# Patient Record
Sex: Male | Born: 1935 | Race: White | Hispanic: No | Smoking: Never smoker
Health system: Southern US, Community
[De-identification: ages and names within clinical notes are randomized; demographics above are authoritative.]

## PROBLEM LIST (undated history)

## (undated) DIAGNOSIS — H348322 Tributary (branch) retinal vein occlusion, left eye, stable: Secondary | ICD-10-CM

## (undated) DIAGNOSIS — I10 Essential (primary) hypertension: Secondary | ICD-10-CM

## (undated) DIAGNOSIS — G459 Transient cerebral ischemic attack, unspecified: Secondary | ICD-10-CM

## (undated) DIAGNOSIS — E7800 Pure hypercholesterolemia, unspecified: Secondary | ICD-10-CM

## (undated) HISTORY — DX: Pure hypercholesterolemia, unspecified: E78.00

## (undated) HISTORY — PX: CAROTID ENDARTERECTOMY: SUR193

## (undated) HISTORY — PX: INNER EAR SURGERY: SHX679

## (undated) HISTORY — DX: Tributary (branch) retinal vein occlusion, left eye, stable: H34.8322

## (undated) HISTORY — DX: Transient cerebral ischemic attack, unspecified: G45.9

## (undated) HISTORY — DX: Essential (primary) hypertension: I10

## (undated) HISTORY — PX: HERNIA REPAIR: SHX51

---

## 2002-10-27 ENCOUNTER — Encounter: Payer: Self-pay | Admitting: General Surgery

## 2002-10-27 ENCOUNTER — Encounter: Admission: RE | Admit: 2002-10-27 | Discharge: 2002-10-27 | Payer: Self-pay | Admitting: General Surgery

## 2002-10-28 ENCOUNTER — Ambulatory Visit (HOSPITAL_BASED_OUTPATIENT_CLINIC_OR_DEPARTMENT_OTHER): Admission: RE | Admit: 2002-10-28 | Discharge: 2002-10-29 | Payer: Self-pay | Admitting: General Surgery

## 2003-06-21 ENCOUNTER — Encounter: Payer: Self-pay | Admitting: Gastroenterology

## 2004-07-07 ENCOUNTER — Ambulatory Visit: Payer: Self-pay | Admitting: Internal Medicine

## 2004-08-04 ENCOUNTER — Ambulatory Visit: Payer: Self-pay | Admitting: Internal Medicine

## 2004-08-07 ENCOUNTER — Ambulatory Visit: Payer: Self-pay | Admitting: Internal Medicine

## 2004-08-08 ENCOUNTER — Ambulatory Visit: Payer: Self-pay | Admitting: Internal Medicine

## 2004-08-15 ENCOUNTER — Ambulatory Visit: Payer: Self-pay | Admitting: Internal Medicine

## 2004-08-29 ENCOUNTER — Ambulatory Visit: Payer: Self-pay | Admitting: Internal Medicine

## 2004-09-12 ENCOUNTER — Ambulatory Visit: Payer: Self-pay | Admitting: Internal Medicine

## 2004-09-19 ENCOUNTER — Ambulatory Visit: Payer: Self-pay | Admitting: Internal Medicine

## 2004-10-10 ENCOUNTER — Ambulatory Visit: Payer: Self-pay | Admitting: Internal Medicine

## 2005-01-01 ENCOUNTER — Ambulatory Visit: Payer: Self-pay | Admitting: Internal Medicine

## 2005-03-23 ENCOUNTER — Ambulatory Visit: Payer: Self-pay | Admitting: Internal Medicine

## 2005-04-03 ENCOUNTER — Ambulatory Visit: Payer: Self-pay | Admitting: Internal Medicine

## 2005-05-04 ENCOUNTER — Ambulatory Visit: Payer: Self-pay | Admitting: Internal Medicine

## 2005-05-25 ENCOUNTER — Ambulatory Visit: Payer: Self-pay | Admitting: Internal Medicine

## 2005-08-03 ENCOUNTER — Ambulatory Visit: Payer: Self-pay | Admitting: Internal Medicine

## 2005-11-28 ENCOUNTER — Ambulatory Visit: Payer: Self-pay | Admitting: Internal Medicine

## 2006-01-03 ENCOUNTER — Ambulatory Visit: Payer: Self-pay | Admitting: Internal Medicine

## 2006-01-31 ENCOUNTER — Ambulatory Visit: Payer: Self-pay | Admitting: Internal Medicine

## 2006-05-23 ENCOUNTER — Ambulatory Visit: Payer: Self-pay | Admitting: Internal Medicine

## 2006-07-24 ENCOUNTER — Ambulatory Visit: Payer: Self-pay | Admitting: Internal Medicine

## 2006-08-19 ENCOUNTER — Ambulatory Visit: Payer: Self-pay | Admitting: Internal Medicine

## 2006-11-19 DIAGNOSIS — I10 Essential (primary) hypertension: Secondary | ICD-10-CM | POA: Insufficient documentation

## 2006-11-19 DIAGNOSIS — H9190 Unspecified hearing loss, unspecified ear: Secondary | ICD-10-CM | POA: Insufficient documentation

## 2006-11-19 DIAGNOSIS — R519 Headache, unspecified: Secondary | ICD-10-CM | POA: Insufficient documentation

## 2006-11-19 DIAGNOSIS — R51 Headache: Secondary | ICD-10-CM | POA: Insufficient documentation

## 2006-11-19 DIAGNOSIS — H348392 Tributary (branch) retinal vein occlusion, unspecified eye, stable: Secondary | ICD-10-CM | POA: Insufficient documentation

## 2006-11-19 DIAGNOSIS — K279 Peptic ulcer, site unspecified, unspecified as acute or chronic, without hemorrhage or perforation: Secondary | ICD-10-CM | POA: Insufficient documentation

## 2006-12-03 ENCOUNTER — Ambulatory Visit: Payer: Self-pay | Admitting: Internal Medicine

## 2007-01-09 ENCOUNTER — Ambulatory Visit: Payer: Self-pay | Admitting: Internal Medicine

## 2007-01-09 DIAGNOSIS — M2550 Pain in unspecified joint: Secondary | ICD-10-CM | POA: Insufficient documentation

## 2007-01-11 ENCOUNTER — Encounter: Payer: Self-pay | Admitting: Internal Medicine

## 2007-01-11 LAB — CONVERTED CEMR LAB
BUN: 23 mg/dL (ref 6–23)
Basophils Absolute: 0.1 10*3/uL (ref 0.0–0.1)
Basophils Relative: 0.8 % (ref 0.0–1.0)
CO2: 26 meq/L (ref 19–32)
Calcium: 9.2 mg/dL (ref 8.4–10.5)
Chloride: 105 meq/L (ref 96–112)
Creatinine, Ser: 1.1 mg/dL (ref 0.4–1.5)
Eosinophils Absolute: 0.2 10*3/uL (ref 0.0–0.6)
Eosinophils Relative: 2.4 % (ref 0.0–5.0)
GFR calc Af Amer: 83 mL/min
GFR calc non Af Amer: 69 mL/min
Glucose, Bld: 81 mg/dL (ref 70–99)
HCT: 44.3 % (ref 39.0–52.0)
Hemoglobin: 14.7 g/dL (ref 13.0–17.0)
Lymphocytes Relative: 24.8 % (ref 12.0–46.0)
MCHC: 33.2 g/dL (ref 30.0–36.0)
MCV: 86.5 fL (ref 78.0–100.0)
Monocytes Absolute: 0.6 10*3/uL (ref 0.2–0.7)
Monocytes Relative: 8.2 % (ref 3.0–11.0)
Neutro Abs: 5 10*3/uL (ref 1.4–7.7)
Neutrophils Relative %: 63.8 % (ref 43.0–77.0)
Platelets: 237 10*3/uL (ref 150–400)
Potassium: 4.3 meq/L (ref 3.5–5.1)
RBC: 5.12 M/uL (ref 4.22–5.81)
RDW: 12.7 % (ref 11.5–14.6)
Sed Rate: 11 mm/hr (ref 0–20)
Sodium: 138 meq/L (ref 135–145)
TSH: 1.52 microintl units/mL (ref 0.35–5.50)
Total CK: 103 units/L (ref 7–195)
WBC: 7.9 10*3/uL (ref 4.5–10.5)

## 2007-01-13 ENCOUNTER — Telehealth (INDEPENDENT_AMBULATORY_CARE_PROVIDER_SITE_OTHER): Payer: Self-pay | Admitting: *Deleted

## 2007-01-23 ENCOUNTER — Ambulatory Visit: Payer: Self-pay | Admitting: Cardiology

## 2007-01-27 ENCOUNTER — Ambulatory Visit: Payer: Self-pay | Admitting: Internal Medicine

## 2007-01-27 DIAGNOSIS — I739 Peripheral vascular disease, unspecified: Secondary | ICD-10-CM

## 2007-01-27 DIAGNOSIS — I779 Disorder of arteries and arterioles, unspecified: Secondary | ICD-10-CM | POA: Insufficient documentation

## 2007-02-04 ENCOUNTER — Encounter: Admission: RE | Admit: 2007-02-04 | Discharge: 2007-02-04 | Payer: Self-pay | Admitting: Internal Medicine

## 2007-03-07 ENCOUNTER — Encounter: Payer: Self-pay | Admitting: Internal Medicine

## 2007-03-15 ENCOUNTER — Encounter: Admission: RE | Admit: 2007-03-15 | Discharge: 2007-03-15 | Payer: Self-pay | Admitting: Neurology

## 2007-06-19 ENCOUNTER — Ambulatory Visit: Payer: Self-pay | Admitting: Internal Medicine

## 2007-09-26 ENCOUNTER — Encounter: Payer: Self-pay | Admitting: Internal Medicine

## 2007-09-27 ENCOUNTER — Encounter: Payer: Self-pay | Admitting: Internal Medicine

## 2007-10-02 ENCOUNTER — Encounter: Payer: Self-pay | Admitting: Internal Medicine

## 2007-10-06 ENCOUNTER — Ambulatory Visit: Payer: Self-pay | Admitting: Internal Medicine

## 2007-10-06 DIAGNOSIS — G459 Transient cerebral ischemic attack, unspecified: Secondary | ICD-10-CM | POA: Insufficient documentation

## 2007-10-17 ENCOUNTER — Telehealth (INDEPENDENT_AMBULATORY_CARE_PROVIDER_SITE_OTHER): Payer: Self-pay | Admitting: *Deleted

## 2007-10-20 ENCOUNTER — Telehealth: Payer: Self-pay | Admitting: Internal Medicine

## 2007-10-28 ENCOUNTER — Telehealth (INDEPENDENT_AMBULATORY_CARE_PROVIDER_SITE_OTHER): Payer: Self-pay | Admitting: *Deleted

## 2009-12-14 ENCOUNTER — Encounter (INDEPENDENT_AMBULATORY_CARE_PROVIDER_SITE_OTHER): Payer: Self-pay | Admitting: *Deleted

## 2010-08-29 NOTE — Letter (Signed)
Summary: New Patient letter  [facility]  [address] [place], [place] [address]   Phone: [phone]  Fax: [phone]       12/14/2009 MRN: [id number]  [facility] [place], [place]  [address]  Dear Mr. [facility],  Welcome to the Gastroenterology Division at [facility].    You are scheduled to see Dr.   Deatra Ina on 01-23-10 at 1:30pm on the 3rd floor at [facility] [place] [employer] [facility].  We ask that you try to arrive at our office 15 minutes prior to your appointment time to allow for check-in.  We would like you to complete the enclosed self-administered evaluation form prior to your visit and bring it with you on the day of your appointment.  We will review it with you.  Also, please bring a complete list of all your medications or, if you prefer, bring the medication bottles and we will list them.  Please bring your insurance card so that we may make a copy of it.  If your insurance requires a referral to see a specialist, please bring your referral form from your primary care physician.  Co-payments are due at the time of your visit and may be paid by cash, check or credit card.     Your office visit will consist of a consult with your physician (includes a physical exam), any laboratory testing he/she may order, scheduling of any necessary diagnostic testing (e.g. x-ray, ultrasound, CT-scan), and scheduling of a procedure (e.g. Endoscopy, Colonoscopy) if required.  Please allow enough time on your schedule to allow for any/all of these possibilities.    If you cannot keep your appointment, please call [phone] to cancel or reschedule prior to your appointment date.  This allows [place] the opportunity to schedule an appointment for another patient in need of care.  If you do not cancel or reschedule by 5 p.m. the business day prior to your appointment date, you will be charged a $50.00 late cancellation/no-show fee.    Thank you for choosing [facility] for your medical needs.  We appreciate the opportunity to care for you.  Please visit [place] at our website  to learn more about our practice.                     Sincerely,                                                             The Gastroenterology Division

## 2010-08-29 NOTE — Procedures (Signed)
Summary: colonoscopy   Colonoscopy  Procedure date:  06/21/2003  Findings:      Results: Hemorrhoids.     Results: Diverticulosis.       Pathology:  Adenomatous polyp.        Location:  [facility].    Comments:      Repeat colonoscopy in 5 years.  Patient Name: [name], [name] MRN:  Procedure Procedures: Colonoscopy CPT: [id number].    with Hot Biopsy(s)CPT: [id number].  Personnel: Endoscopist: Sandy Salaam. Deatra Ina, MD.  Indications  Increased Risk Screening: For family history of colorectal neoplasia, in   History  Current Medications: Patient is not currently taking Coumadin.  Pre-Exam Physical: Performed Jun 21, 2003. Entire physical exam was normal.  Exam Exam: Extent of exam reached: Cecum, extent intended: Cecum.  The cecum was identified by IC valve. Colon retroflexion performed. ASA Classification: II. Tolerance: good.  Monitoring: Pulse and BP monitoring, Oximetry used. Supplemental O2 given. at 2 Liters.  Colon Prep Used Golytely for colon prep. Prep results: good.  Sedation Meds: Fentanyl 50 mcg. given IV. Versed 5 mg. given IV.  Findings - DIVERTICULOSIS: Descending Colon to Sigmoid Colon. ICD9: Diverticulosis: 562.10. Comments: Diffuse, scattered tics.  NORMAL EXAM: Cecum.  POLYP: Cecum, Maximum size: 2 mm. Procedure:  hot biopsy, ICD9: Colon Polyps: 211.3.  HEMORRHOIDS: Internal. ICD9: Hemorrhoids, Internal: 455.0.   Assessment Abnormal examination, see findings above.  Diagnoses: 211.3: Colon Polyps.  562.10: Diverticulosis.  455.0: Hemorrhoids, Internal.   Events  Unplanned Interventions: No intervention was required.  Unplanned Events: There were no complications. Plans Patient Education: Patient given standard instructions for: Polyps. Diverticulosis. Hemorrhoids.  Scheduling/Referral: Colonoscopy, to Sandy Salaam. Deatra Ina, MD, around Jun 20, 2008.    This report was created from the original endoscopy report, which was reviewed  and signed by the above listed endoscopist.

## 2010-12-15 NOTE — Op Note (Signed)
[name], [name]                         ACCOUNT NO.:  [id number]   MEDICAL RECORD NO.:  [id number]                   PATIENT TYPE:  AMB   LOCATION:  [facility]                                  FACILITY:   PHYSICIAN:  Shellia Carwin, M.D.              DATE OF BIRTH:  Sep 04, 1927   DATE OF PROCEDURE:  10/28/2002  DATE OF DISCHARGE:                                 OPERATIVE REPORT   OPERATION/PROCEDURE:  Right inguinal hernia repair with Kugel mesh system.   SURGEON:  Shellia Carwin, M.D.   ANESTHESIA:  General.   PREOPERATIVE DIAGNOSIS:  Right inguinal hernia.   POSTOPERATIVE DIAGNOSIS:  Right inguinal hernia, large lipoma of the cord,  indirect hernia, direct hernia.   CLINICAL SUMMARY:  A 77 year old male with bulge in his groin for over 20  years.  Has become symptomatic.   OPERATIVE FINDINGS:  The patient had a large lipoma of the right cord, a  large hernia with a large amount of fat around it in the indirect sac, a  direct hernia.  The cord structures were identified and they and the nerves  are not injured.   DESCRIPTION OF PROCEDURE:  Under satisfactory general anesthesia, having  received IV Ancef, the patient was positioned, prepped and draped in the  standard fashion.  A total of 30 cc of 0.5% Marcaine with epinephrine was  infiltrated throughout for postop analgesia.  A transverse incision made and  carried down to and through the external ring and external oblique.  Self-  retaining retractor gave excellent exposure and the cord and its contents  were mobilized.  The large lipoma was dissected high, ligated with Vicryl  suture and excised.  The large hernia sac was felt best handled by just  inversion after high dissection and was not opened.  After it was inverted  through the internal ring, the floor of the canal was opened from the pubis  to the internal ring and finger dissection used to sweep away the  preperitoneal contents and allow for placement of  the mesh.  The three-inch  circle of the Kugel mesh system was anchored to Cooper's ligament with a  single 0 Prolene suture.  The gauze that used to hold the contents away was  removed and then the mesh unfolded in the preperitoneal space.  It extended  from the pubis to beyond the internal ring and dissection such that it lay  deep to the inferior epigastric vessels.  A single 0 Prolene suture was used  at the medial aspect through the handle of the mesh to the undersurface of  the abdominal wall.  The floor of the canal was then closed over the mesh,  taking intermittent bites of it using a 0 Prolene suture and when tied at  the internal ring, the ring was snug, and the ends of the suture left long.  The remaining oval of the mesh  was tailored to fit in the floor of the canal  with a slit to go around the cord structures. It was anchored medially in  the soft tissue near the pubis, at the internal ring with the previous  sutures and then with a single suture in the inguinal ligament and a single  in the internal oblique medially.  The tails were brought around the cord  and sutured to each other.  The mesh lay flat, in good position and the  wound was lavaged with saline.  Closure accomplished in layers  with running 2-0 Vicryl for external oblique, interrupted 2-0 Vicryl for  deep fascia, 3-0 Vicryl for subcu, Steri-Strips for skin.  Sterile absorbent  dressings were applied and the patient went to the recovery room from the  operating room in good condition without complication.                                               Shellia Carwin, M.D.    MRL/MEDQ  D:  10/28/2002  T:  10/28/2002  Job:  [id number]

## 2016-01-03 LAB — HEPATIC FUNCTION PANEL
ALK PHOS: 114 (ref 25–125)
ALT: 9 — AB (ref 10–40)
AST: 18 (ref 14–40)
BILIRUBIN, TOTAL: 0.6

## 2016-01-03 LAB — BASIC METABOLIC PANEL
BUN: 19 (ref 4–21)
Creatinine: 1.3 (ref 0.6–1.3)
GLUCOSE: 113
Potassium: 4.6 (ref 3.4–5.3)
SODIUM: 137 (ref 137–147)

## 2016-01-03 LAB — CBC AND DIFFERENTIAL
HEMATOCRIT: 46 (ref 41–53)
HEMOGLOBIN: 14.7 (ref 13.5–17.5)
Platelets: 209 (ref 150–399)
WBC: 7.9

## 2016-06-27 LAB — LIPID PANEL
Cholesterol: 145 (ref 0–200)
HDL: 44 (ref 35–70)
LDL CALC: 79
Triglycerides: 110 (ref 40–160)

## 2016-06-27 LAB — BASIC METABOLIC PANEL
BUN: 19 (ref 4–21)
Creatinine: 1.2 (ref 0.6–1.3)
GLUCOSE: 102
Potassium: 4.7 (ref 3.4–5.3)
Sodium: 140 (ref 137–147)

## 2016-06-27 LAB — CBC AND DIFFERENTIAL
HCT: 45 (ref 41–53)
Hemoglobin: 14.5 (ref 13.5–17.5)
Platelets: 225 (ref 150–399)
WBC: 7.5

## 2016-06-27 LAB — TSH: TSH: 2.92 (ref 0.41–5.90)

## 2016-06-27 LAB — HEPATIC FUNCTION PANEL
ALK PHOS: 125 (ref 25–125)
ALT: 11 (ref 10–40)
AST: 11 — AB (ref 14–40)
BILIRUBIN, TOTAL: 0.5

## 2016-07-27 DIAGNOSIS — I10 Essential (primary) hypertension: Secondary | ICD-10-CM | POA: Diagnosis not present

## 2016-08-27 DIAGNOSIS — M159 Polyosteoarthritis, unspecified: Secondary | ICD-10-CM | POA: Diagnosis not present

## 2016-08-27 DIAGNOSIS — Z7901 Long term (current) use of anticoagulants: Secondary | ICD-10-CM | POA: Diagnosis not present

## 2016-08-27 DIAGNOSIS — M47812 Spondylosis without myelopathy or radiculopathy, cervical region: Secondary | ICD-10-CM | POA: Diagnosis not present

## 2016-08-27 DIAGNOSIS — G5763 Lesion of plantar nerve, bilateral lower limbs: Secondary | ICD-10-CM | POA: Diagnosis not present

## 2016-08-27 DIAGNOSIS — H6121 Impacted cerumen, right ear: Secondary | ICD-10-CM | POA: Diagnosis not present

## 2016-08-27 DIAGNOSIS — Z8673 Personal history of transient ischemic attack (TIA), and cerebral infarction without residual deficits: Secondary | ICD-10-CM | POA: Diagnosis not present

## 2016-08-27 DIAGNOSIS — M47819 Spondylosis without myelopathy or radiculopathy, site unspecified: Secondary | ICD-10-CM | POA: Diagnosis not present

## 2016-08-27 DIAGNOSIS — I10 Essential (primary) hypertension: Secondary | ICD-10-CM | POA: Diagnosis not present

## 2016-08-27 DIAGNOSIS — Z6829 Body mass index (BMI) 29.0-29.9, adult: Secondary | ICD-10-CM | POA: Diagnosis not present

## 2016-08-27 DIAGNOSIS — E785 Hyperlipidemia, unspecified: Secondary | ICD-10-CM | POA: Diagnosis not present

## 2016-08-27 DIAGNOSIS — Z961 Presence of intraocular lens: Secondary | ICD-10-CM | POA: Diagnosis not present

## 2016-08-27 DIAGNOSIS — E663 Overweight: Secondary | ICD-10-CM | POA: Diagnosis not present

## 2016-08-27 DIAGNOSIS — Z9849 Cataract extraction status, unspecified eye: Secondary | ICD-10-CM | POA: Diagnosis not present

## 2016-08-27 DIAGNOSIS — Z974 Presence of external hearing-aid: Secondary | ICD-10-CM | POA: Diagnosis not present

## 2016-08-27 DIAGNOSIS — Z0000 Encounter for general adult medical examination without abnormal findings: Secondary | ICD-10-CM | POA: Diagnosis not present

## 2016-08-27 DIAGNOSIS — H918X1 Other specified hearing loss, right ear: Secondary | ICD-10-CM | POA: Diagnosis not present

## 2016-10-16 DIAGNOSIS — I6523 Occlusion and stenosis of bilateral carotid arteries: Secondary | ICD-10-CM | POA: Diagnosis not present

## 2016-10-24 DIAGNOSIS — Z23 Encounter for immunization: Secondary | ICD-10-CM | POA: Diagnosis not present

## 2016-10-24 DIAGNOSIS — I739 Peripheral vascular disease, unspecified: Secondary | ICD-10-CM | POA: Diagnosis not present

## 2016-10-24 DIAGNOSIS — I10 Essential (primary) hypertension: Secondary | ICD-10-CM | POA: Diagnosis not present

## 2016-10-24 DIAGNOSIS — E7800 Pure hypercholesterolemia, unspecified: Secondary | ICD-10-CM | POA: Diagnosis not present

## 2016-10-24 LAB — HEPATIC FUNCTION PANEL
ALK PHOS: 116 (ref 25–125)
ALT: 8 — AB (ref 10–40)
AST: 17 (ref 14–40)
BILIRUBIN, TOTAL: 0.5

## 2016-10-24 LAB — BASIC METABOLIC PANEL
BUN: 33 — AB (ref 4–21)
CREATININE: 1.3 (ref 0.6–1.3)
Glucose: 100
Potassium: 5 (ref 3.4–5.3)
SODIUM: 139 (ref 137–147)

## 2016-10-24 LAB — CBC AND DIFFERENTIAL
HCT: 41 (ref 41–53)
Hemoglobin: 13.4 — AB (ref 13.5–17.5)
Platelets: 227 (ref 150–399)
WBC: 7.3

## 2016-12-26 DIAGNOSIS — T162XXA Foreign body in left ear, initial encounter: Secondary | ICD-10-CM | POA: Diagnosis not present

## 2017-05-13 DIAGNOSIS — Z23 Encounter for immunization: Secondary | ICD-10-CM | POA: Diagnosis not present

## 2017-06-05 DIAGNOSIS — I10 Essential (primary) hypertension: Secondary | ICD-10-CM | POA: Diagnosis not present

## 2017-06-05 DIAGNOSIS — R7309 Other abnormal glucose: Secondary | ICD-10-CM | POA: Diagnosis not present

## 2017-06-05 DIAGNOSIS — E7800 Pure hypercholesterolemia, unspecified: Secondary | ICD-10-CM | POA: Diagnosis not present

## 2017-12-05 ENCOUNTER — Encounter: Payer: Self-pay | Admitting: Family Medicine

## 2017-12-05 ENCOUNTER — Ambulatory Visit (INDEPENDENT_AMBULATORY_CARE_PROVIDER_SITE_OTHER): Payer: Medicare HMO | Admitting: Family Medicine

## 2017-12-05 VITALS — BP 110/60 | HR 59 | Ht 70.0 in | Wt 186.4 lb

## 2017-12-05 DIAGNOSIS — G629 Polyneuropathy, unspecified: Secondary | ICD-10-CM | POA: Diagnosis not present

## 2017-12-05 DIAGNOSIS — I10 Essential (primary) hypertension: Secondary | ICD-10-CM | POA: Diagnosis not present

## 2017-12-05 DIAGNOSIS — G459 Transient cerebral ischemic attack, unspecified: Secondary | ICD-10-CM

## 2017-12-05 DIAGNOSIS — I6522 Occlusion and stenosis of left carotid artery: Secondary | ICD-10-CM

## 2017-12-05 MED ORDER — SIMVASTATIN 5 MG PO TABS: 5.0000 mg | ORAL_TABLET | Freq: Every day | ORAL | 3 refills | Status: DC

## 2017-12-05 NOTE — Patient Instructions (Signed)
Carotid Artery Disease The carotid arteries are arteries on both sides of the neck. They carry blood to the brain. Carotid artery disease is when the arteries get smaller (narrow) or get blocked. If these arteries get smaller or get blocked, you are more likely to have a stroke or warning stroke (transient ischemic attack). Follow these instructions at home:  Take medicines as told by your doctor. Make sure you understand all your medicine instructions. Do not stop your medicines without talking to your doctor first.  Follow your doctor's diet instructions. It is important to eat a healthy diet that includes plenty of: ? Fresh fruits. ? Vegetables. ? Lean meats.  Avoid: ? High-fat foods. ? High-sodium foods. ? Foods that are fried, overly processed, or have poor nutritional value.  Stay a healthy weight.  Stay active. Get at least 30 minutes of activity every day.  Do not smoke.  Limit alcohol use to: ? No more than 2 drinks a day for men. ? No more than 1 drink a day for women who are not pregnant.  Do not use illegal drugs.  Keep all doctor visits as told. Get help right away if:  You have sudden weakness or loss of feeling (numbness) on one side of the body, such as the face, arm, or leg.  You have sudden confusion.  You have trouble speaking (aphasia) or understanding.  You have sudden trouble seeing out of one or both eyes.  You have sudden trouble walking.  You have dizziness or feel like you might pass out (faint).  You have a loss of balance or your movements are not steady (uncoordinated).  You have a sudden, severe headache with no known cause.  You have trouble swallowing (dysphagia). Call your local emergency services (911 in [place] Do notdrive yourself to the clinic or hospital. This information is not intended to replace advice given to you by your health care provider. Make sure you discuss any questions you have with your health care  provider. Document Released: 07/02/2012 Document Revised: 12/22/2015 Document Reviewed: 01/14/2013 Elsevier Interactive Patient Education  2018 Elsevier Inc.  

## 2017-12-05 NOTE — Progress Notes (Signed)
Subjective:  Patient ID: [name], male    DOB: 04/25/1928  Age: 84 y.o. MRN: [id number]  CC: Establish Care   HPI [name] presents for follow-up of his hypertension, carotid artery stenosis and neuropathy.  Patient is well-known to me.  He does not smoke drink or use illicit drugs.  He is status post carotid endarterectomy on the right.  Last carotid ultrasound on the left some years ago had shown a 60% lesion he tells me.  He has a history of TIA.  He continues to take low-dose simvastatin Plavix and his blood pressure has been well controlled.  He lost his wife at the end of March.  He has a long-standing history of  Outpatient Medications Prior to Visit  Medication Sig Dispense Refill  . acetaminophen (TYLENOL) 500 MG tablet Take 500 mg by mouth every 6 (six) hours as needed.    [name] amLODipine (NORVASC) 10 MG tablet Take 1 tablet by mouth daily.  1  . Cholecalciferol (VITAMIN D3) 2000 units TABS Take 1 tablet by mouth daily.    . clopidogrel (PLAVIX) 75 MG tablet Take 1 tablet by mouth daily.    [name] gabapentin (NEURONTIN) 300 MG capsule Take 1 capsule by mouth 2 (two) times daily.  1  . lisinopril (PRINIVIL,ZESTRIL) 10 MG tablet Take 1 tablet by mouth daily.    . vitamin B-12 (CYANOCOBALAMIN) 500 MCG tablet Take 500 mcg by mouth daily.    . vitamin C (ASCORBIC ACID) 500 MG tablet Take 500 mg by mouth daily.    . Cholecalciferol (VITAMIN D-1000 MAX ST) 1000 units tablet Take 1,000 Units by mouth daily.    . simvastatin (ZOCOR) 5 MG tablet Take 1 tablet by mouth daily.     No facility-administered medications prior to visit.     ROS Review of Systems  Constitutional: Negative for chills, fatigue, fever and unexpected weight change.  HENT: Positive for postnasal drip, rhinorrhea and sneezing.   Eyes: Negative for photophobia and visual disturbance.  Respiratory: Negative.  Negative for chest tightness and shortness of breath.   Cardiovascular: Negative for chest pain and  palpitations.  Gastrointestinal: Negative.   Endocrine: Negative for polyphagia and polyuria.  Genitourinary: Negative for dysuria, frequency and urgency.  Musculoskeletal: Positive for gait problem. Negative for joint swelling.  Skin: Negative.   Allergic/Immunologic: Negative for immunocompromised state.  Neurological: Positive for numbness. Negative for weakness and headaches.  Hematological: Does not bruise/bleed easily.  Psychiatric/Behavioral: Negative.     Objective:  BP 110/60   Pulse (!) 59   Ht 5\' 10"  (1.778 m)   Wt 186 lb 6 oz (84.5 kg)   SpO2 94%   BMI 26.74 kg/m   BP Readings from Last 3 Encounters:  12/05/17 110/60    Wt Readings from Last 3 Encounters:  12/05/17 186 lb 6 oz (84.5 kg)    Physical Exam  Constitutional: He is oriented to person, place, and time. He appears well-developed and well-nourished. No distress.  HENT:  Head: Normocephalic and atraumatic.  Right Ear: External ear normal.  Left Ear: External ear normal.  Mouth/Throat: Oropharynx is clear and moist. No oropharyngeal exudate.  Eyes: Pupils are equal, round, and reactive to light. Conjunctivae and EOM are normal. Right eye exhibits no discharge. Left eye exhibits no discharge. No scleral icterus.  Neck: Normal range of motion. Neck supple. No JVD present. No tracheal deviation present. No thyromegaly present.  Cardiovascular: Normal rate and regular rhythm.  Murmur heard.  Systolic murmur is present. Pulses:      Carotid pulses are 2+ on the right side, and 1+ on the left side. Faint bruit over left carotid.    Pulmonary/Chest: Effort normal. No stridor. No respiratory distress.  Abdominal: Bowel sounds are normal.  Lymphadenopathy:    He has no cervical adenopathy.  Neurological: He is alert and oriented to person, place, and time.  Skin: Skin is warm and dry. No rash noted. He is not diaphoretic. No erythema. No pallor.  Psychiatric: He has a normal mood and affect. His behavior  is normal.    Lab Results  Component Value Date   WBC 7.9 01/09/2007   HGB 14.7 01/09/2007   HCT 44.3 01/09/2007   PLT 237 01/09/2007   GLUCOSE 81 01/09/2007   NA 138 01/09/2007   K 4.3 01/09/2007   CL 105 01/09/2007   CREATININE 1.1 01/09/2007   BUN 23 01/09/2007   CO2 26 01/09/2007   TSH 1.52 01/09/2007    No results found.  Assessment & Plan:   [name] was seen today for establish care.  Diagnoses and all orders for this visit:  Essential hypertension -     CBC; Future -     Comprehensive metabolic panel; Future -     Lipid panel; Future -     Urinalysis, Routine w reflex microscopic; Future -     Microalbumin / creatinine urine ratio; Future  Transient cerebral ischemia, unspecified type -     simvastatin (ZOCOR) 5 MG tablet; Take 1 tablet (5 mg total) by mouth at bedtime.  Carotid stenosis, asymptomatic, left -     simvastatin (ZOCOR) 5 MG tablet; Take 1 tablet (5 mg total) by mouth at bedtime. -     VAS US CAROTID; Future -     Ambulatory referral to Vascular Surgery -     Lipid panel; Future  Neuropathy -     CBC; Future   I have discontinued [name]'s Cholecalciferol. I have also changed his simvastatin. Additionally, I am having him maintain his amLODipine, clopidogrel, gabapentin, lisinopril, acetaminophen, vitamin B-12, vitamin C, and Vitamin D3.  Meds ordered this encounter  Medications  . simvastatin (ZOCOR) 5 MG tablet    Sig: Take 1 tablet (5 mg total) by mouth at bedtime.    Dispense:  100 tablet    Refill:  3   Patient will return fasting for his blood work.  He will bring his medicines with him in a few weeks.  I encouraged him to begin using a cane in his right hand.  Will consider physical therapy.  Blood work is pending.  Follow-up: Return in about 2 weeks (around 12/19/2017), or bring your medicines with you.  Libby Maw, MD

## 2017-12-09 ENCOUNTER — Encounter: Payer: Medicare HMO | Admitting: Family Medicine

## 2017-12-10 ENCOUNTER — Ambulatory Visit (INDEPENDENT_AMBULATORY_CARE_PROVIDER_SITE_OTHER): Payer: Medicare HMO | Admitting: Family Medicine

## 2017-12-10 ENCOUNTER — Encounter: Payer: Self-pay | Admitting: Family Medicine

## 2017-12-10 VITALS — BP 110/60 | HR 52 | Ht 70.0 in | Wt 187.2 lb

## 2017-12-10 DIAGNOSIS — G629 Polyneuropathy, unspecified: Secondary | ICD-10-CM | POA: Diagnosis not present

## 2017-12-10 DIAGNOSIS — R0982 Postnasal drip: Secondary | ICD-10-CM | POA: Diagnosis not present

## 2017-12-10 DIAGNOSIS — I10 Essential (primary) hypertension: Secondary | ICD-10-CM

## 2017-12-10 DIAGNOSIS — H9193 Unspecified hearing loss, bilateral: Secondary | ICD-10-CM | POA: Diagnosis not present

## 2017-12-10 DIAGNOSIS — I6522 Occlusion and stenosis of left carotid artery: Secondary | ICD-10-CM | POA: Diagnosis not present

## 2017-12-10 DIAGNOSIS — Z0001 Encounter for general adult medical examination with abnormal findings: Secondary | ICD-10-CM

## 2017-12-10 HISTORY — DX: Postnasal drip: R09.82

## 2017-12-10 LAB — LIPID PANEL
CHOLESTEROL: 134 mg/dL (ref 0–200)
HDL: 36 mg/dL — AB (ref >39.00)
LDL Cholesterol: 75 mg/dL (ref 0–99)
NonHDL: 97.57
TRIGLYCERIDES: 114 mg/dL (ref 0.0–149.0)
Total CHOL/HDL Ratio: 4
VLDL: 22.8 mg/dL (ref 0.0–40.0)

## 2017-12-10 LAB — URINALYSIS, ROUTINE W REFLEX MICROSCOPIC
BILIRUBIN URINE: NEGATIVE
Hgb urine dipstick: NEGATIVE
KETONES UR: NEGATIVE
LEUKOCYTES UA: NEGATIVE
Nitrite: NEGATIVE
PH: 5.5 (ref 5.0–8.0)
RBC / HPF: NONE SEEN (ref 0–?)
Specific Gravity, Urine: >=1.03 — AB (ref 1.000–1.030)
Total Protein, Urine: NEGATIVE
Urine Glucose: NEGATIVE
Urobilinogen, UA: 0.2 (ref 0.0–1.0)

## 2017-12-10 LAB — CBC
HCT: 41.1 % (ref 39.0–52.0)
Hemoglobin: 13.2 g/dL (ref 13.0–17.0)
MCHC: 32.2 g/dL (ref 30.0–36.0)
MCV: 85.4 fl (ref 78.0–100.0)
PLATELETS: 255 10*3/uL (ref 150.0–400.0)
RBC: 4.81 Mil/uL (ref 4.22–5.81)
RDW: 14.2 % (ref 11.5–15.5)
WBC: 7.1 10*3/uL (ref 4.0–10.5)

## 2017-12-10 LAB — COMPREHENSIVE METABOLIC PANEL
ALBUMIN: 3.8 g/dL (ref 3.5–5.2)
ALK PHOS: 120 U/L — AB (ref 39–117)
ALT: 9 U/L (ref 0–53)
AST: 11 U/L (ref 0–37)
BUN: 27 mg/dL — ABNORMAL HIGH (ref 6–23)
CALCIUM: 8.8 mg/dL (ref 8.4–10.5)
CO2: 24 mEq/L (ref 19–32)
CREATININE: 1.11 mg/dL (ref 0.40–1.50)
Chloride: 106 mEq/L (ref 96–112)
GFR: 66.42 mL/min (ref >60.00)
Glucose, Bld: 98 mg/dL (ref 70–99)
Potassium: 5.3 mEq/L — ABNORMAL HIGH (ref 3.5–5.1)
Sodium: 137 mEq/L (ref 135–145)
TOTAL PROTEIN: 6.7 g/dL (ref 6.0–8.3)
Total Bilirubin: 0.5 mg/dL (ref 0.2–1.2)

## 2017-12-10 NOTE — Progress Notes (Addendum)
Subjective:  Patient ID: [name], male    DOB: 01-Feb-1928  Age: 84 y.o. MRN: [id number]  CC: Annual Exam   HPI KEISHAUN HAZEL presents for a physical exam.  He is fasting day and planning on having his blood work drawn.  He lost his wife a few months ago she is suffered from congestive heart failure dementia and COPD.  He is currently living alone.  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  His daughter is currently living with him.  His son who lives in [place] is planning on moving back into this area.  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  Patient will be seeing vascular surgeon in July.  He did he does not smoke drink alcohol or use illicit drugs.  He stays active with a [employer].  Patient has experienced no changes in his bowel habits.  He denies chest pain shortness of breath lower extremity edema.  He sleeps on one pillow.  He tells me that his urine flow is good but he does have nocturia once or twice nightly.  Patient does not think increase dose of Neurontin to 3 times daily has helped his peripheral neuropathy a great deal.  He is tolerating the increase.  Outpatient Medications Prior to Visit  Medication Sig Dispense Refill  . acetaminophen (TYLENOL) 500 MG tablet Take 500 mg by mouth every 6 (six) hours as needed.    [name] amLODipine (NORVASC) 10 MG tablet Take 1 tablet by mouth daily.  1  . Cholecalciferol (VITAMIN D3) 2000 units TABS Take 1 tablet by mouth daily.    . clopidogrel (PLAVIX) 75 MG tablet Take 1 tablet by mouth daily.    [name] lisinopril (PRINIVIL,ZESTRIL) 10 MG tablet Take 1 tablet by mouth daily.    . simvastatin (ZOCOR) 5 MG tablet Take 1 tablet (5 mg total) by mouth at bedtime. 100 tablet 3  . vitamin B-12 (CYANOCOBALAMIN) 500 MCG tablet Take 500 mcg by mouth daily.    . vitamin C (ASCORBIC ACID) 500 MG tablet Take 500 mg by mouth daily.      [name] gabapentin (NEURONTIN) 300 MG capsule Take 1 capsule by mouth 3 (three) times daily.  1   No facility-administered medications prior to visit.     ROS Review of Systems  Constitutional: Negative for activity change, appetite change, fatigue, fever and unexpected weight change.  HENT: Positive for hearing loss, postnasal drip and rhinorrhea. Negative for sinus pressure, sinus pain, sore throat, trouble swallowing and voice change.   Eyes: Negative for photophobia and visual disturbance.  Respiratory: Negative for cough, chest tightness and wheezing.   Cardiovascular: Negative for chest pain, palpitations and leg swelling.  Gastrointestinal: Negative.   Endocrine: Negative for polyphagia and polyuria.  Genitourinary: Negative for difficulty urinating, frequency and urgency.  Skin: Negative for pallor and wound.  Allergic/Immunologic: Negative for immunocompromised state.  Neurological: Positive for numbness and headaches.  Hematological: Does not bruise/bleed easily.  Psychiatric/Behavioral: Negative.     Objective:  BP 110/60   Pulse (!) 52   Ht 5\' 10"  (1.778 m)   Wt 187 lb 4 oz (84.9 kg)   SpO2 97%   BMI 26.87 kg/m   BP Readings from Last 3 Encounters:  12/10/17 110/60  12/05/17 110/60    Wt Readings from Last 3 Encounters:  12/10/17 187 lb 4 oz (84.9 kg)  12/05/17 186  lb 6 oz (84.5 kg)    Physical Exam  Constitutional: He is oriented to person, place, and time. He appears well-developed and well-nourished. No distress.  HENT:  Head: Normocephalic and atraumatic.  Right Ear: External ear normal.  Left Ear: External ear normal.  Nose: Nose normal.  Mouth/Throat: Oropharynx is clear and moist. No oropharyngeal exudate.  Eyes: Pupils are equal, round, and reactive to light. Conjunctivae and EOM are normal. Right eye exhibits no discharge. Left eye exhibits no discharge. No scleral icterus.  Neck: Normal range of motion. Neck supple. No JVD present. No tracheal  deviation present. No thyromegaly present.  Cardiovascular: Normal rate, regular rhythm and normal heart sounds.  Pulses:      Carotid pulses are 2+ on the right side, and 1+ on the left side. Pulmonary/Chest: Effort normal. No stridor. He has no decreased breath sounds. He has no wheezes. He has rhonchi in the right lower field and the left lower field. He has no rales.  Abdominal: Soft. Bowel sounds are normal. He exhibits no distension and no mass. There is no tenderness. There is no rebound and no guarding.  Musculoskeletal: He exhibits edema (trace).  Lymphadenopathy:    He has no cervical adenopathy.  Neurological: He is alert and oriented to person, place, and time.  Skin: Skin is warm and dry. He is not diaphoretic.  Psychiatric: He has a normal mood and affect. His behavior is normal.    Lab Results  Component Value Date   WBC 7.1 12/10/2017   HGB 13.2 12/10/2017   HCT 41.1 12/10/2017   PLT 255.0 12/10/2017   GLUCOSE 98 12/10/2017   CHOL 134 12/10/2017   TRIG 114.0 12/10/2017   HDL 36.00 (L) 12/10/2017   LDLCALC 75 12/10/2017   ALT 9 12/10/2017   AST 11 12/10/2017   NA 137 12/10/2017   K 4.8 12/19/2017   CL 106 12/10/2017   CREATININE 1.11 12/10/2017   BUN 27 (H) 12/10/2017   CO2 24 12/10/2017   TSH 2.92 06/27/2016   MICROALBUR 1.2 12/10/2017    No results found.  Assessment & Plan:   Chidi was seen today for annual exam.  Diagnoses and all orders for this visit:  Essential hypertension -     Microalbumin / creatinine urine ratio -     Urinalysis, Routine w reflex microscopic -     Lipid panel -     Comprehensive metabolic panel -     CBC  Carotid stenosis, asymptomatic, left -     Lipid panel  Bilateral hearing loss, unspecified hearing loss type  Neuropathy -     CBC -     Ambulatory referral to Neurology  Post-nasal drip   I am having Jadan B. Callow maintain his amLODipine, clopidogrel, lisinopril, acetaminophen, vitamin B-12, vitamin C,  Vitamin D3, and simvastatin.  No orders of the defined types were placed in this encounter.  Patient will have his fasting labs drawn today.  I recommended that he try Flonase or Nasonex for his postnasal drip. Pt has tolerated neurontin and it has helped his neuropathy. He requests a referral to see a neurologist.   Follow-up: Return in about 3 months (around 03/12/2018).  Libby Maw, MD

## 2017-12-10 NOTE — Patient Instructions (Signed)

## 2017-12-11 LAB — MICROALBUMIN / CREATININE URINE RATIO
CREATININE, U: 113.1 mg/dL
MICROALB UR: 1.2 mg/dL (ref 0.0–1.9)
Microalb Creat Ratio: 1 mg/g (ref 0.0–30.0)

## 2017-12-12 ENCOUNTER — Other Ambulatory Visit: Payer: Self-pay

## 2017-12-12 DIAGNOSIS — E875 Hyperkalemia: Secondary | ICD-10-CM

## 2017-12-13 ENCOUNTER — Encounter: Payer: Self-pay | Admitting: Family Medicine

## 2017-12-19 ENCOUNTER — Other Ambulatory Visit (INDEPENDENT_AMBULATORY_CARE_PROVIDER_SITE_OTHER): Payer: Medicare HMO

## 2017-12-19 DIAGNOSIS — E875 Hyperkalemia: Secondary | ICD-10-CM

## 2017-12-19 LAB — POTASSIUM: POTASSIUM: 4.8 meq/L (ref 3.5–5.1)

## 2017-12-31 ENCOUNTER — Telehealth: Payer: Self-pay | Admitting: Family Medicine

## 2017-12-31 DIAGNOSIS — H524 Presbyopia: Secondary | ICD-10-CM | POA: Diagnosis not present

## 2017-12-31 DIAGNOSIS — H3562 Retinal hemorrhage, left eye: Secondary | ICD-10-CM | POA: Diagnosis not present

## 2017-12-31 DIAGNOSIS — H5203 Hypermetropia, bilateral: Secondary | ICD-10-CM | POA: Diagnosis not present

## 2017-12-31 DIAGNOSIS — Z961 Presence of intraocular lens: Secondary | ICD-10-CM | POA: Diagnosis not present

## 2017-12-31 DIAGNOSIS — H52203 Unspecified astigmatism, bilateral: Secondary | ICD-10-CM | POA: Diagnosis not present

## 2017-12-31 DIAGNOSIS — H26492 Other secondary cataract, left eye: Secondary | ICD-10-CM | POA: Diagnosis not present

## 2017-12-31 NOTE — Telephone Encounter (Signed)
Copied from [place] [id number]. Topic: Quick Communication - Rx Refill/Question >> Dec 31, 2017 10:36 AM Carolyn Stare wrote: Medication   gabapentin (NEURONTIN) 300 MG capsule     Preferred Pharmacy  [facility]   Agent: Please be advised that RX refills may take up to 3 business days. We ask that you follow-up with your pharmacy.

## 2018-01-01 MED ORDER — GABAPENTIN 300 MG PO CAPS: 300.0000 mg | ORAL_CAPSULE | Freq: Two times a day (BID) | ORAL | 2 refills | Status: DC

## 2018-01-01 NOTE — Telephone Encounter (Signed)
Rx sent in to requested pharmacy.  Rx last filled by Dr. Ethelene Hal while he was at [facility].

## 2018-01-01 NOTE — Telephone Encounter (Signed)
Refill request Neurontin 300 mg  LOV 12-10-2017 DR Ethelene Hal  Last filled 12-05-2017  Historical Provider   Pharmacy [facility]  [facility]

## 2018-01-07 ENCOUNTER — Other Ambulatory Visit: Payer: Self-pay | Admitting: Family Medicine

## 2018-01-07 MED ORDER — CLOPIDOGREL BISULFATE 75 MG PO TABS: 75.0000 mg | ORAL_TABLET | Freq: Every day | ORAL | 1 refills | Status: DC

## 2018-01-07 NOTE — Telephone Encounter (Signed)
Please also ask him to schedule an appointment to see me.

## 2018-01-07 NOTE — Telephone Encounter (Signed)
Please advise.     Copied from [place] [id number]. Topic: Referral - Request >> Jan 07, 2018 11:44 AM Rutherford Nail, NT wrote: Reason for CRM: Patient calling and would like a referral placed for a neurologist. States that Dr Azalia Bilis is aware, but he is having right leg pain when he walks. Thinks that it is neuropathy.

## 2018-01-07 NOTE — Addendum Note (Signed)
Addended by: Jon Billings on: 01/07/2018 12:54 PM   Modules accepted: Orders

## 2018-01-07 NOTE — Telephone Encounter (Signed)
The referral has been placed & rx has been refilled. Patient is aware.

## 2018-01-07 NOTE — Telephone Encounter (Signed)
Copied from [place] [id number]. Topic: Quick Communication - Rx Refill/Question >> Jan 07, 2018 11:43 AM Selinda Flavin B, NT wrote: Medication: clopidogrel (PLAVIX) 75 MG tablet  Has the patient contacted their pharmacy? Yes.   (Agent: If no, request that the patient contact the pharmacy for the refill.) (Agent: If yes, when and what did the pharmacy advise?)  Preferred Pharmacy (with phone number or street name): [place], [place]: Please be advised that RX refills may take up to 3 business days. We ask that you follow-up with your pharmacy.

## 2018-01-09 DIAGNOSIS — M9904 Segmental and somatic dysfunction of sacral region: Secondary | ICD-10-CM | POA: Diagnosis not present

## 2018-01-09 DIAGNOSIS — M9903 Segmental and somatic dysfunction of lumbar region: Secondary | ICD-10-CM | POA: Diagnosis not present

## 2018-01-09 DIAGNOSIS — M9905 Segmental and somatic dysfunction of pelvic region: Secondary | ICD-10-CM | POA: Diagnosis not present

## 2018-01-09 DIAGNOSIS — G544 Lumbosacral root disorders, not elsewhere classified: Secondary | ICD-10-CM | POA: Diagnosis not present

## 2018-01-10 DIAGNOSIS — G544 Lumbosacral root disorders, not elsewhere classified: Secondary | ICD-10-CM | POA: Diagnosis not present

## 2018-01-10 DIAGNOSIS — M9905 Segmental and somatic dysfunction of pelvic region: Secondary | ICD-10-CM | POA: Diagnosis not present

## 2018-01-10 DIAGNOSIS — M9904 Segmental and somatic dysfunction of sacral region: Secondary | ICD-10-CM | POA: Diagnosis not present

## 2018-01-10 DIAGNOSIS — M9903 Segmental and somatic dysfunction of lumbar region: Secondary | ICD-10-CM | POA: Diagnosis not present

## 2018-01-13 DIAGNOSIS — M9904 Segmental and somatic dysfunction of sacral region: Secondary | ICD-10-CM | POA: Diagnosis not present

## 2018-01-13 DIAGNOSIS — G544 Lumbosacral root disorders, not elsewhere classified: Secondary | ICD-10-CM | POA: Diagnosis not present

## 2018-01-13 DIAGNOSIS — M9905 Segmental and somatic dysfunction of pelvic region: Secondary | ICD-10-CM | POA: Diagnosis not present

## 2018-01-13 DIAGNOSIS — M9903 Segmental and somatic dysfunction of lumbar region: Secondary | ICD-10-CM | POA: Diagnosis not present

## 2018-01-14 ENCOUNTER — Encounter: Payer: Self-pay | Admitting: Diagnostic Neuroimaging

## 2018-01-14 ENCOUNTER — Ambulatory Visit: Payer: Medicare HMO | Admitting: Diagnostic Neuroimaging

## 2018-01-14 VITALS — BP 167/62 | HR 47 | Ht 70.0 in | Wt 187.6 lb

## 2018-01-14 DIAGNOSIS — G629 Polyneuropathy, unspecified: Secondary | ICD-10-CM

## 2018-01-14 DIAGNOSIS — M5416 Radiculopathy, lumbar region: Secondary | ICD-10-CM | POA: Diagnosis not present

## 2018-01-14 NOTE — Patient Instructions (Signed)
-   check neuropathy labs  - [we considered MRI lumbar spine, but not likely helpful, since patient does not want to pursue surgical intervention]  - consider increasing gabapentin up to 600mg  three times a day  - consider physical therapy

## 2018-01-14 NOTE — Progress Notes (Signed)
[facility]  PATIENT: [name] DOB: 1928-03-07  REFERRING CLINICIAN: Dara Hoyer, MD HISTORY FROM: patient  REASON FOR VISIT: new consult    HISTORICAL  CHIEF COMPLAINT:  Chief Complaint  Patient presents with  . Neuropathy    rm 6, New Pt, " bad pain in right leg from toes to lower back; hx of diving injury"    HISTORY OF PRESENT ILLNESS:   84 year old male here for evaluation of lower back pain radiating to the right leg.  Patient has had pain in his right thigh and right foot for the past 45 years.  This is related to a prior injury.  He is also had numbness in the bilateral feet for approximately 25 years, and has been diagnosed with "neuropathy" but no specific cause.  In the last 1 to 2 weeks patient had a sudden increase in pain in his right leg, related to physical exertion that he was doing recently.  No problems in his upper extremities or left leg that are new.  No vision, speech or swallow difficulties.   REVIEW OF SYSTEMS: Full 14 system review of systems performed and negative with exception of: Hearing loss joint pain allergies numbness runny nose.  ALLERGIES: Allergies  Allergen Reactions  . Codeine Other (See Comments)    No reaction noted  . Penicillins     REACTION: rash    HOME MEDICATIONS: Outpatient Medications Prior to Visit  Medication Sig Dispense Refill  . acetaminophen (TYLENOL) 500 MG tablet Take 500 mg by mouth every 6 (six) hours as needed.    [name] amLODipine (NORVASC) 10 MG tablet Take 1 tablet by mouth daily.  1  . Cholecalciferol (VITAMIN D3) 2000 units TABS Take 1 tablet by mouth daily.    . clopidogrel (PLAVIX) 75 MG tablet Take 1 tablet (75 mg total) by mouth daily. 90 tablet 1  . gabapentin (NEURONTIN) 300 MG capsule Take 1 capsule (300 mg total) by mouth 2 (two) times daily. 60 capsule 2  . lisinopril (PRINIVIL,ZESTRIL) 10 MG tablet Take 1 tablet by mouth daily.    . simvastatin (ZOCOR) 5 MG tablet Take 1  tablet (5 mg total) by mouth at bedtime. 100 tablet 3  . vitamin B-12 (CYANOCOBALAMIN) 500 MCG tablet Take 500 mcg by mouth daily.    . vitamin C (ASCORBIC ACID) 500 MG tablet Take 500 mg by mouth daily.     No facility-administered medications prior to visit.     PAST MEDICAL HISTORY: Past Medical History:  Diagnosis Date  . Branch retinal vein occlusion of left eye   . High cholesterol   . Hypertension   . TIA (transient ischemic attack)     PAST SURGICAL HISTORY: Past Surgical History:  Procedure Laterality Date  . CAROTID ENDARTERECTOMY Right   . HERNIA REPAIR    . INNER EAR SURGERY Right    for hearing loss    FAMILY HISTORY: Family History  Problem Relation Age of Onset  . Lung cancer Father     SOCIAL HISTORY:  Social History   Socioeconomic History  . Marital status: Widowed    Spouse name: Not on file  . Number of children: 4  . Years of education: Not on file  . 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 Financial resource strain: Not on file  . Food insecurity:    Worry: Not on file  Inability: Not on file  . Transportation needs:    Medical: Not on file    Non-medical: Not on file  Tobacco Use  . Smoking status: Never Smoker  . Smokeless tobacco: Never Used  Substance and Sexual Activity  . Alcohol use: Yes    Comment: 1 glass wine  . Drug use: Never  . Sexual activity: Not on file  Lifestyle  . Physical activity:    Days per week: Not on file    Minutes per session: Not on file  . Stress: Not on file  Relationships  . Social connections:    Talks on phone: Not on file    Gets together: Not on file    Attends religious service: Not on file    Active member of club or organization: Not on file    Attends meetings of clubs or organizations: Not on file    Relationship status: Not on file  . Intimate partner violence:    Fear of current or ex partner:  Not on file    Emotionally abused: Not on file    Physically abused: Not on file    Forced sexual activity: Not on file  Other Topics Concern  . Not on file  Social History Narrative   Lives alone     PHYSICAL EXAM  GENERAL EXAM/CONSTITUTIONAL: Vitals:  Vitals:   01/14/18 1450  BP: (!) 167/62  Pulse: (!) 47  Weight: 187 lb 9.6 oz (85.1 kg)  Height: '5\' 10"'$  (1.778 m)     Body mass index is 26.92 kg/m.  Visual Acuity Screening   Right eye Left eye Both eyes  Without correction: 20/40    With correction:     Comments: Unable to see letters with left eye    Patient is in no distress; well developed, nourished and groomed; neck is supple  CARDIOVASCULAR:  Examination of carotid arteries is normal; no carotid bruits  Regular rate and rhythm, no murmurs  Examination of peripheral vascular system by observation and palpation is normal  EYES:  Ophthalmoscopic exam of optic discs and posterior segments is normal; no papilledema or hemorrhages  MUSCULOSKELETAL:  Gait, strength, tone, movements noted in Neurologic exam below  NEUROLOGIC: MENTAL STATUS:  No flowsheet data found.  awake, alert, oriented to person, place and time  recent and remote memory intact  normal attention and concentration  language fluent, comprehension intact, naming intact,   fund of knowledge appropriate  CRANIAL NERVE:   2nd - no papilledema on fundoscopic exam  2nd, 3rd, 4th, 6th - pupils equal and reactive to light, visual fields full to confrontation, extraocular muscles intact, no nystagmus  5th - facial sensation symmetric  7th - facial strength symmetric  8th - hearing intact  9th - palate elevates symmetrically, uvula midline  11th - shoulder shrug symmetric  12th - tongue protrusion midline  MOTOR:   normal bulk and tone, full strength in the BUE, BLE; EXCEPT BILATERAL FOOT DROP (MILD; 4/5)  SENSORY:   normal and symmetric to light touch  ABSENT VIB AT  ANKLES AND TOES  DECR PP IN FEET AND ANKLES  COORDINATION:   finger-nose-finger, fine finger movements normal  REFLEXES:   deep tendon reflexes TRACE and symmetric; ABSENT AT ANKLES  GAIT/STATION:   narrow based gait; ANTALGIC GAIT; LIMPS ON RIGHT    DIAGNOSTIC DATA (LABS, IMAGING, TESTING) - I reviewed patient records, labs, notes, testing and imaging myself where available.  Lab Results  Component Value Date   WBC  7.1 12/10/2017   HGB 13.2 12/10/2017   HCT 41.1 12/10/2017   MCV 85.4 12/10/2017   PLT 255.0 12/10/2017      Component Value Date/Time   NA 137 12/10/2017 1043   NA 139 10/24/2016   K 4.8 12/19/2017 0944   CL 106 12/10/2017 1043   CO2 24 12/10/2017 1043   GLUCOSE 98 12/10/2017 1043   BUN 27 (H) 12/10/2017 1043   BUN 33 (A) 10/24/2016   CREATININE 1.11 12/10/2017 1043   CALCIUM 8.8 12/10/2017 1043   PROT 6.7 12/10/2017 1043   ALBUMIN 3.8 12/10/2017 1043   AST 11 12/10/2017 1043   ALT 9 12/10/2017 1043   ALKPHOS 120 (H) 12/10/2017 1043   BILITOT 0.5 12/10/2017 1043   GFRNONAA 69 01/09/2007 1149   GFRAA 83 01/09/2007 1149   Lab Results  Component Value Date   CHOL 134 12/10/2017   HDL 36.00 (L) 12/10/2017   LDLCALC 75 12/10/2017   TRIG 114.0 12/10/2017   CHOLHDL 4 12/10/2017   No results found for: HGBA1C No results found for: VITAMINB12 Lab Results  Component Value Date   TSH 2.92 06/27/2016    02/04/07 MRI brain  1. No evidence of acute or subacute insult.  2. Old stroke affecting the basal ganglia on the left with some residual hemosiderin.  3. Minor chronic small vessel changes elsewhere in the hemispheric white matter and in the cerebellum.      ASSESSMENT AND PLAN  84 y.o. year old male here with chronic right lumbar radiculopathy, peripheral neuropathy, with worsening symptoms in the last 1 to 2 weeks related to physical activity.  May represent exacerbation of right lumbar radiculopathy.  Patient currently on gabapentin and this  can be increased temporarily to help with symptoms.  Recommend conservative management for the next few weeks to see if symptoms improve.   Dx: lumbar spinal stenosis / radiculopathy + peripheral neuropathy  1. Neuropathy   2. Lumbar radiculopathy     PLAN:  - check neuropathy labs - [we considered MRI lumbar spine, but not likely helpful, since patient does not want to pursue surgical intervention] - consider increasing gabapentin up to '600mg'$  three times a day - consider physical therapy - caution with with driving  Orders Placed This Encounter  Procedures  . Vitamin B12  . Vitamin B6  . Multiple Myeloma Panel (SPEP&IFE w/QIG)  . ANA,IFA RA Diag Pnl w/rflx Tit/Patn   Return if symptoms worsen or fail to improve, for return to PCP.    Penni Bombard, MD 5/00/1642, 9:03 PM Certified in Neurology, Neurophysiology and Neuroimaging  [facility] [address] [place], [place] [address] [phone]

## 2018-01-15 DIAGNOSIS — M9903 Segmental and somatic dysfunction of lumbar region: Secondary | ICD-10-CM | POA: Diagnosis not present

## 2018-01-15 DIAGNOSIS — G544 Lumbosacral root disorders, not elsewhere classified: Secondary | ICD-10-CM | POA: Diagnosis not present

## 2018-01-15 DIAGNOSIS — M9904 Segmental and somatic dysfunction of sacral region: Secondary | ICD-10-CM | POA: Diagnosis not present

## 2018-01-15 DIAGNOSIS — M9905 Segmental and somatic dysfunction of pelvic region: Secondary | ICD-10-CM | POA: Diagnosis not present

## 2018-01-16 DIAGNOSIS — M9905 Segmental and somatic dysfunction of pelvic region: Secondary | ICD-10-CM | POA: Diagnosis not present

## 2018-01-16 DIAGNOSIS — M9903 Segmental and somatic dysfunction of lumbar region: Secondary | ICD-10-CM | POA: Diagnosis not present

## 2018-01-16 DIAGNOSIS — M9904 Segmental and somatic dysfunction of sacral region: Secondary | ICD-10-CM | POA: Diagnosis not present

## 2018-01-16 DIAGNOSIS — G544 Lumbosacral root disorders, not elsewhere classified: Secondary | ICD-10-CM | POA: Diagnosis not present

## 2018-01-16 LAB — ANA,IFA RA DIAG PNL W/RFLX TIT/PATN
ANA TITER 1: NEGATIVE
CYCLIC CITRULLIN PEPTIDE AB: 2 U (ref 0–19)

## 2018-01-16 LAB — MULTIPLE MYELOMA PANEL, SERUM
Albumin SerPl Elph-Mcnc: 3.9 g/dL (ref 2.9–4.4)
Albumin/Glob SerPl: 1.4 (ref 0.7–1.7)
Alpha 1: 0.2 g/dL (ref 0.0–0.4)
Alpha2 Glob SerPl Elph-Mcnc: 0.9 g/dL (ref 0.4–1.0)
B-Globulin SerPl Elph-Mcnc: 1 g/dL (ref 0.7–1.3)
Gamma Glob SerPl Elph-Mcnc: 0.9 g/dL (ref 0.4–1.8)
Globulin, Total: 2.9 g/dL (ref 2.2–3.9)
IGA/IMMUNOGLOBULIN A, SERUM: 297 mg/dL (ref 61–437)
IGG (IMMUNOGLOBIN G), SERUM: 948 mg/dL (ref 700–1600)
IGM (IMMUNOGLOBULIN M), SRM: 64 mg/dL (ref 15–143)
Total Protein: 6.8 g/dL (ref 6.0–8.5)

## 2018-01-16 LAB — VITAMIN B12: Vitamin B-12: 563 pg/mL (ref 232–1245)

## 2018-01-16 LAB — VITAMIN B6: Vitamin B6: 10.6 ug/L (ref 5.3–46.7)

## 2018-01-17 ENCOUNTER — Telehealth: Payer: Self-pay | Admitting: Neurology

## 2018-01-17 NOTE — Telephone Encounter (Signed)
-----   Message from Penni Bombard, MD sent at 01/17/2018 12:23 PM EDT ----- Normal labs. Please call patient. -VRP

## 2018-01-17 NOTE — Telephone Encounter (Signed)
Called the patient to inform of the normal lab work. Pt verbalized understanding. Pt had no questions at this time but was encouraged to call back if questions arise.

## 2018-01-20 DIAGNOSIS — M9905 Segmental and somatic dysfunction of pelvic region: Secondary | ICD-10-CM | POA: Diagnosis not present

## 2018-01-20 DIAGNOSIS — M9904 Segmental and somatic dysfunction of sacral region: Secondary | ICD-10-CM | POA: Diagnosis not present

## 2018-01-20 DIAGNOSIS — G544 Lumbosacral root disorders, not elsewhere classified: Secondary | ICD-10-CM | POA: Diagnosis not present

## 2018-01-20 DIAGNOSIS — M9903 Segmental and somatic dysfunction of lumbar region: Secondary | ICD-10-CM | POA: Diagnosis not present

## 2018-01-22 ENCOUNTER — Ambulatory Visit: Payer: Medicare HMO | Admitting: Vascular Surgery

## 2018-01-22 ENCOUNTER — Ambulatory Visit (HOSPITAL_COMMUNITY)
Admission: RE | Admit: 2018-01-22 | Discharge: 2018-01-22 | Disposition: A | Discharge status: Home / Self Care | Payer: Medicare HMO | Source: Ambulatory Visit | Attending: Vascular Surgery | Admitting: Vascular Surgery

## 2018-01-22 ENCOUNTER — Encounter: Payer: Self-pay | Admitting: Vascular Surgery

## 2018-01-22 VITALS — BP 120/58 | HR 42 | Temp 96.5°F | Ht 70.0 in | Wt 187.0 lb

## 2018-01-22 DIAGNOSIS — Z9889 Other specified postprocedural states: Secondary | ICD-10-CM | POA: Insufficient documentation

## 2018-01-22 DIAGNOSIS — I6522 Occlusion and stenosis of left carotid artery: Secondary | ICD-10-CM | POA: Diagnosis not present

## 2018-01-22 DIAGNOSIS — M9901 Segmental and somatic dysfunction of cervical region: Secondary | ICD-10-CM | POA: Diagnosis not present

## 2018-01-22 DIAGNOSIS — G544 Lumbosacral root disorders, not elsewhere classified: Secondary | ICD-10-CM | POA: Diagnosis not present

## 2018-01-22 DIAGNOSIS — M9903 Segmental and somatic dysfunction of lumbar region: Secondary | ICD-10-CM | POA: Diagnosis not present

## 2018-01-22 DIAGNOSIS — M9904 Segmental and somatic dysfunction of sacral region: Secondary | ICD-10-CM | POA: Diagnosis not present

## 2018-01-22 DIAGNOSIS — I6523 Occlusion and stenosis of bilateral carotid arteries: Secondary | ICD-10-CM

## 2018-01-22 NOTE — Progress Notes (Signed)
New Carotid Patient  Requested by:  Libby Maw, MD [place], [place] [address]  Reason for consultation: left carotid stenosis   History of Present Illness   [name] is a 84 y.o. ([birth year]) male who presents with chief complaint: establishing new vascular relationship.  Pt previously had a R CEA with Dr. Maryjean Morn for a TIA.  Patient has history of TIA  symptom.  The patient has never had amaurosis fugax.  He has had retinal vein occlusion which required laser intervention.  The patient has mever had facial drooping or hemiplegia.  He had a brief period of L arm weakness.  The patient has an episode of syncope with expressive aphasia.  The patient's previous neurologic deficits have resolved.  The patient's risks factors for carotid disease include: HLD, HTN.  Past Medical History:  Diagnosis Date  . Branch retinal vein occlusion of left eye   . High cholesterol   . Hypertension   . TIA (transient ischemic attack)     Past Surgical History:  Procedure Laterality Date  . CAROTID ENDARTERECTOMY Right   . HERNIA REPAIR    . INNER EAR SURGERY Right    for hearing loss    Social History   Socioeconomic History  . Marital status: Widowed    Spouse name: Not on file  . Number of children: 4  . Years of education: Not on file  . 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 Financial resource strain: Not on file  . Food insecurity:    Worry: Not on file    Inability: Not on file  . Transportation needs:    Medical: Not on file    Non-medical: Not on file  Tobacco Use  . Smoking status: Never Smoker  . Smokeless tobacco: Never Used  Substance and Sexual Activity  . Alcohol use: Yes    Comment: 1 glass wine  . Drug use: Never  . Sexual activity: Not on file  Lifestyle  . Physical activity:    Days per week: Not on file    Minutes per session: Not on  file  . Stress: Not on file  Relationships  . Social connections:    Talks on phone: Not on file    Gets together: Not on file    Attends religious service: Not on file    Active member of club or organization: Not on file    Attends meetings of clubs or organizations: Not on file    Relationship status: Not on file  . Intimate partner violence:    Fear of current or ex partner: Not on file    Emotionally abused: Not on file    Physically abused: Not on file    Forced sexual activity: Not on file  Other Topics Concern  . Not on file  Social History Narrative   Lives alone    Family History  Problem Relation Age of Onset  . Lung cancer Father     Current Outpatient Medications  Medication Sig Dispense Refill  . acetaminophen (TYLENOL) 500 MG tablet Take 500 mg by mouth every 6 (six) hours as needed.    [name] amLODipine (NORVASC) 10 MG tablet Take 1 tablet by mouth daily.  1  . Cholecalciferol (VITAMIN D3) 2000 units TABS Take 1 tablet by mouth daily.    . clopidogrel (PLAVIX) 75 MG tablet Take 1 tablet (  75 mg total) by mouth daily. 90 tablet 1  . gabapentin (NEURONTIN) 300 MG capsule Take 1 capsule (300 mg total) by mouth 2 (two) times daily. 60 capsule 2  . lisinopril (PRINIVIL,ZESTRIL) 10 MG tablet Take 1 tablet by mouth daily.    . simvastatin (ZOCOR) 5 MG tablet Take 1 tablet (5 mg total) by mouth at bedtime. 100 tablet 3  . vitamin B-12 (CYANOCOBALAMIN) 500 MCG tablet Take 500 mcg by mouth daily.    . vitamin C (ASCORBIC ACID) 500 MG tablet Take 500 mg by mouth daily.     No current facility-administered medications for this visit.     Allergies  Allergen Reactions  . Codeine Other (See Comments)    No reaction noted  . Penicillins     REACTION: rash    REVIEW OF SYSTEMS (negative unless checked):   Cardiac:  []  Chest pain or chest pressure? []  Shortness of breath upon activity? []  Shortness of breath when lying flat? []  Irregular heart rhythm?  Vascular:    [x]  Pain in calf, thigh, or hip brought on by walking? []  Pain in feet at night that wakes you up from your sleep? []  Blood clot in your veins? []  Leg swelling?  Pulmonary:  []  Oxygen at home? []  Productive cough? []  Wheezing?  Neurologic:  []  Sudden weakness in arms or legs? []  Sudden numbness in arms or legs? []  Sudden onset of difficult speaking or slurred speech? []  Temporary loss of vision in one eye? []  Problems with dizziness?  Gastrointestinal:  []  Blood in stool? []  Vomited blood?  Genitourinary:  []  Burning when urinating? []  Blood in urine?  Psychiatric:  []  Major depression  Hematologic:  []  Bleeding problems? []  Problems with blood clotting?  Dermatologic:  []  Rashes or ulcers?  Constitutional:  []  Fever or chills?  Ear/Nose/Throat:  []  Change in hearing? []  Nose bleeds? []  Sore throat?  Musculoskeletal:  []  Back pain? []  Joint pain? []  Muscle pain?   For VQI Use Only   PRE-ADM LIVING Home  AMB STATUS Ambulatory  CAD Sx None  PRIOR CHF None  STRESS TEST No    Physical Examination     Vitals:   01/22/18 1411  BP: (!) 120/58  Pulse: (!) 42  Temp: (!) 96.5 F (35.8 C)  TempSrc: Oral  SpO2: 98%  Weight: 187 lb (84.8 kg)  Height: 5\' 10"  (1.778 m)   Body mass index is 26.83 kg/m.  General Alert, O x 3, WD, NAD  Head [place]/AT,    Ear/Nose/ Throat Hearing grossly intact, nares without erythema or drainage, oropharynx without Erythema or Exudate, Mallampati score: 3,   Eyes PERRLA, EOMI,    Neck Supple, mid-line trachea,    Pulmonary Sym exp, good B air movt, CTA B  Cardiac RRR, Nl S1, S2, no Murmurs, No rubs, No S3,S4  Vascular Vessel Right Left  Radial Palpable Palpable  Brachial Palpable Palpable  Carotid Palpable, No Bruit Palpable, No Bruit  Aorta Not palpable N/A  Femoral Palpable Palpable  Popliteal Not palpable Not palpable  PT Faintly palpable Faintly palpable  DP Faintly palpable Faintly palpable     Gastro- intestinal soft, non-distended, non-tender to palpation, No guarding or rebound, no HSM, no masses, no CVAT B, No palpable prominent aortic pulse,    Musculo- skeletal M/S 5/5 throughout  , Extremities without ischemic changes  , No edema present, No visible varicosities , No Lipodermatosclerosis present  Neurologic Cranial nerves 2-12 intact , Pain and light touch  intact in extremities , Motor exam as listed above  Psychiatric Judgement intact, Mood & affect appropriate for pt's clinical situation  Dermatologic See M/S exam for extremity exam, No rashes otherwise noted  Lymphatic  Palpable lymph nodes: None     Non-invasive Vascular Imaging   B Carotid Duplex (01/22/2018):  [name] R ICA stenosis:  Patent CEA . R VA: patent and antegrade . L ICA stenosis:  60-79% . L VA: patent and antegrade   Outside Studies/Documentation   5 pages of outside documents were reviewed including: outpatient PCP chart.   Medical Decision Making   [name] is a 84 y.o. male who presents with: s/p R CEA for TIA, asx LICA stenosis 09-38%.   Based on the patient's vascular studies and examination, I have offered the patient: q6 month B carotid duplex. . I discussed in depth with the patient the nature of atherosclerosis, and emphasized the importance of maximal medical management including strict control of blood pressure, blood glucose, and lipid levels, obtaining regular exercise, antiplatelet agents, and cessation of smoking.    The patient is currently on a statin: Zocor.   The patient is currently on an anti-platelet: Plavix.  The patient is aware that without maximal medical management the underlying atherosclerotic disease process will progress, limiting the benefit of any interventions.  Thank you for allowing [place] to participate in this patient's care.   Adele Barthel, MD, FACS [facility] Office: [phone] Pager: [phone]  01/22/2018, 2:54  PM

## 2018-01-23 DIAGNOSIS — M9901 Segmental and somatic dysfunction of cervical region: Secondary | ICD-10-CM | POA: Diagnosis not present

## 2018-01-23 DIAGNOSIS — M9903 Segmental and somatic dysfunction of lumbar region: Secondary | ICD-10-CM | POA: Diagnosis not present

## 2018-01-23 DIAGNOSIS — G544 Lumbosacral root disorders, not elsewhere classified: Secondary | ICD-10-CM | POA: Diagnosis not present

## 2018-01-23 DIAGNOSIS — M9904 Segmental and somatic dysfunction of sacral region: Secondary | ICD-10-CM | POA: Diagnosis not present

## 2018-01-27 ENCOUNTER — Other Ambulatory Visit: Payer: Self-pay

## 2018-01-27 DIAGNOSIS — M9904 Segmental and somatic dysfunction of sacral region: Secondary | ICD-10-CM | POA: Diagnosis not present

## 2018-01-27 DIAGNOSIS — G544 Lumbosacral root disorders, not elsewhere classified: Secondary | ICD-10-CM | POA: Diagnosis not present

## 2018-01-27 DIAGNOSIS — M9903 Segmental and somatic dysfunction of lumbar region: Secondary | ICD-10-CM | POA: Diagnosis not present

## 2018-01-27 DIAGNOSIS — M9901 Segmental and somatic dysfunction of cervical region: Secondary | ICD-10-CM | POA: Diagnosis not present

## 2018-01-27 MED ORDER — GABAPENTIN 300 MG PO CAPS: 300.0000 mg | ORAL_CAPSULE | Freq: Three times a day (TID) | ORAL | 1 refills | Status: DC

## 2018-01-27 NOTE — Telephone Encounter (Signed)
After reviewing Dr. Bebe Shaggy note, patient is taking Gabapentin 300 mg three times daily. New rx sent into patient's preferred pharmacy.

## 2018-01-28 DIAGNOSIS — M9904 Segmental and somatic dysfunction of sacral region: Secondary | ICD-10-CM | POA: Diagnosis not present

## 2018-01-28 DIAGNOSIS — G544 Lumbosacral root disorders, not elsewhere classified: Secondary | ICD-10-CM | POA: Diagnosis not present

## 2018-01-28 DIAGNOSIS — M9905 Segmental and somatic dysfunction of pelvic region: Secondary | ICD-10-CM | POA: Diagnosis not present

## 2018-01-28 DIAGNOSIS — M9903 Segmental and somatic dysfunction of lumbar region: Secondary | ICD-10-CM | POA: Diagnosis not present

## 2018-01-31 ENCOUNTER — Other Ambulatory Visit: Payer: Self-pay

## 2018-01-31 MED ORDER — LISINOPRIL 10 MG PO TABS: 10.0000 mg | ORAL_TABLET | Freq: Every day | ORAL | 1 refills | Status: DC

## 2018-02-03 ENCOUNTER — Other Ambulatory Visit: Payer: Self-pay

## 2018-02-03 DIAGNOSIS — M9901 Segmental and somatic dysfunction of cervical region: Secondary | ICD-10-CM | POA: Diagnosis not present

## 2018-02-03 DIAGNOSIS — M9903 Segmental and somatic dysfunction of lumbar region: Secondary | ICD-10-CM | POA: Diagnosis not present

## 2018-02-03 DIAGNOSIS — M9904 Segmental and somatic dysfunction of sacral region: Secondary | ICD-10-CM | POA: Diagnosis not present

## 2018-02-03 DIAGNOSIS — G544 Lumbosacral root disorders, not elsewhere classified: Secondary | ICD-10-CM | POA: Diagnosis not present

## 2018-02-03 MED ORDER — AMLODIPINE BESYLATE 10 MG PO TABS: 10.0000 mg | ORAL_TABLET | Freq: Every day | ORAL | 1 refills | Status: DC

## 2018-02-05 ENCOUNTER — Other Ambulatory Visit: Payer: Self-pay

## 2018-02-05 DIAGNOSIS — M9904 Segmental and somatic dysfunction of sacral region: Secondary | ICD-10-CM | POA: Diagnosis not present

## 2018-02-05 DIAGNOSIS — M9905 Segmental and somatic dysfunction of pelvic region: Secondary | ICD-10-CM | POA: Diagnosis not present

## 2018-02-05 DIAGNOSIS — I6523 Occlusion and stenosis of bilateral carotid arteries: Secondary | ICD-10-CM

## 2018-02-05 DIAGNOSIS — M9903 Segmental and somatic dysfunction of lumbar region: Secondary | ICD-10-CM | POA: Diagnosis not present

## 2018-02-05 DIAGNOSIS — G544 Lumbosacral root disorders, not elsewhere classified: Secondary | ICD-10-CM | POA: Diagnosis not present

## 2018-02-06 DIAGNOSIS — G544 Lumbosacral root disorders, not elsewhere classified: Secondary | ICD-10-CM | POA: Diagnosis not present

## 2018-02-06 DIAGNOSIS — M9905 Segmental and somatic dysfunction of pelvic region: Secondary | ICD-10-CM | POA: Diagnosis not present

## 2018-02-06 DIAGNOSIS — M9903 Segmental and somatic dysfunction of lumbar region: Secondary | ICD-10-CM | POA: Diagnosis not present

## 2018-02-06 DIAGNOSIS — M9904 Segmental and somatic dysfunction of sacral region: Secondary | ICD-10-CM | POA: Diagnosis not present

## 2018-02-10 DIAGNOSIS — M9905 Segmental and somatic dysfunction of pelvic region: Secondary | ICD-10-CM | POA: Diagnosis not present

## 2018-02-10 DIAGNOSIS — M9904 Segmental and somatic dysfunction of sacral region: Secondary | ICD-10-CM | POA: Diagnosis not present

## 2018-02-10 DIAGNOSIS — G544 Lumbosacral root disorders, not elsewhere classified: Secondary | ICD-10-CM | POA: Diagnosis not present

## 2018-02-10 DIAGNOSIS — M9903 Segmental and somatic dysfunction of lumbar region: Secondary | ICD-10-CM | POA: Diagnosis not present

## 2018-02-12 DIAGNOSIS — M9905 Segmental and somatic dysfunction of pelvic region: Secondary | ICD-10-CM | POA: Diagnosis not present

## 2018-02-12 DIAGNOSIS — G544 Lumbosacral root disorders, not elsewhere classified: Secondary | ICD-10-CM | POA: Diagnosis not present

## 2018-02-12 DIAGNOSIS — M9903 Segmental and somatic dysfunction of lumbar region: Secondary | ICD-10-CM | POA: Diagnosis not present

## 2018-02-12 DIAGNOSIS — M9904 Segmental and somatic dysfunction of sacral region: Secondary | ICD-10-CM | POA: Diagnosis not present

## 2018-02-13 DIAGNOSIS — G544 Lumbosacral root disorders, not elsewhere classified: Secondary | ICD-10-CM | POA: Diagnosis not present

## 2018-02-13 DIAGNOSIS — M9905 Segmental and somatic dysfunction of pelvic region: Secondary | ICD-10-CM | POA: Diagnosis not present

## 2018-02-13 DIAGNOSIS — M9903 Segmental and somatic dysfunction of lumbar region: Secondary | ICD-10-CM | POA: Diagnosis not present

## 2018-02-13 DIAGNOSIS — M9904 Segmental and somatic dysfunction of sacral region: Secondary | ICD-10-CM | POA: Diagnosis not present

## 2018-02-17 DIAGNOSIS — M9903 Segmental and somatic dysfunction of lumbar region: Secondary | ICD-10-CM | POA: Diagnosis not present

## 2018-02-17 DIAGNOSIS — G544 Lumbosacral root disorders, not elsewhere classified: Secondary | ICD-10-CM | POA: Diagnosis not present

## 2018-02-17 DIAGNOSIS — M9905 Segmental and somatic dysfunction of pelvic region: Secondary | ICD-10-CM | POA: Diagnosis not present

## 2018-02-17 DIAGNOSIS — M9904 Segmental and somatic dysfunction of sacral region: Secondary | ICD-10-CM | POA: Diagnosis not present

## 2018-02-20 DIAGNOSIS — M9903 Segmental and somatic dysfunction of lumbar region: Secondary | ICD-10-CM | POA: Diagnosis not present

## 2018-02-20 DIAGNOSIS — M9905 Segmental and somatic dysfunction of pelvic region: Secondary | ICD-10-CM | POA: Diagnosis not present

## 2018-02-20 DIAGNOSIS — G544 Lumbosacral root disorders, not elsewhere classified: Secondary | ICD-10-CM | POA: Diagnosis not present

## 2018-02-20 DIAGNOSIS — M9904 Segmental and somatic dysfunction of sacral region: Secondary | ICD-10-CM | POA: Diagnosis not present

## 2018-02-24 DIAGNOSIS — M9905 Segmental and somatic dysfunction of pelvic region: Secondary | ICD-10-CM | POA: Diagnosis not present

## 2018-02-24 DIAGNOSIS — M9903 Segmental and somatic dysfunction of lumbar region: Secondary | ICD-10-CM | POA: Diagnosis not present

## 2018-02-24 DIAGNOSIS — G544 Lumbosacral root disorders, not elsewhere classified: Secondary | ICD-10-CM | POA: Diagnosis not present

## 2018-02-24 DIAGNOSIS — M9904 Segmental and somatic dysfunction of sacral region: Secondary | ICD-10-CM | POA: Diagnosis not present

## 2018-02-26 DIAGNOSIS — M9904 Segmental and somatic dysfunction of sacral region: Secondary | ICD-10-CM | POA: Diagnosis not present

## 2018-02-26 DIAGNOSIS — M9903 Segmental and somatic dysfunction of lumbar region: Secondary | ICD-10-CM | POA: Diagnosis not present

## 2018-02-26 DIAGNOSIS — M9905 Segmental and somatic dysfunction of pelvic region: Secondary | ICD-10-CM | POA: Diagnosis not present

## 2018-02-26 DIAGNOSIS — G544 Lumbosacral root disorders, not elsewhere classified: Secondary | ICD-10-CM | POA: Diagnosis not present

## 2018-03-13 ENCOUNTER — Ambulatory Visit: Payer: Medicare HMO | Admitting: Family Medicine

## 2018-03-17 ENCOUNTER — Encounter: Payer: Self-pay | Admitting: Family Medicine

## 2018-03-17 ENCOUNTER — Ambulatory Visit (INDEPENDENT_AMBULATORY_CARE_PROVIDER_SITE_OTHER): Payer: Medicare HMO | Admitting: Family Medicine

## 2018-03-17 VITALS — BP 136/62 | HR 43 | Ht 70.0 in | Wt 186.0 lb

## 2018-03-17 DIAGNOSIS — I10 Essential (primary) hypertension: Secondary | ICD-10-CM

## 2018-03-17 DIAGNOSIS — R001 Bradycardia, unspecified: Secondary | ICD-10-CM

## 2018-03-17 LAB — BASIC METABOLIC PANEL
BUN: 25 mg/dL — ABNORMAL HIGH (ref 6–23)
CALCIUM: 9.2 mg/dL (ref 8.4–10.5)
CO2: 24 mEq/L (ref 19–32)
Chloride: 107 mEq/L (ref 96–112)
Creatinine, Ser: 1.29 mg/dL (ref 0.40–1.50)
GFR: 55.81 mL/min — AB (ref >60.00)
GLUCOSE: 100 mg/dL — AB (ref 70–99)
Potassium: 5.5 mEq/L — ABNORMAL HIGH (ref 3.5–5.1)
SODIUM: 137 meq/L (ref 135–145)

## 2018-03-17 NOTE — Progress Notes (Signed)
Subjective:  Patient ID: [name], male    DOB: 01-14-1928  Age: 84 y.o. MRN: [id number]  CC: Follow-up   HPI [name] presents for confirmation of his current dosages of amlodipine and lisinopril.  He is having no trouble taking these medicines.  He denies chest pain shortness of breath lower extremity edema or cough.  He denies lightheadedness when standing or with change in head position.  He has recently sold his [place] and is very relieved and there is less stress in his life.   Outpatient Medications Prior to Visit  Medication Sig Dispense Refill  . acetaminophen (TYLENOL) 500 MG tablet Take 500 mg by mouth every 6 (six) hours as needed.    [name] amLODipine (NORVASC) 10 MG tablet Take 1 tablet (10 mg total) by mouth daily. 90 tablet 1  . Cholecalciferol (VITAMIN D3) 2000 units TABS Take 1 tablet by mouth daily.    . clopidogrel (PLAVIX) 75 MG tablet Take 1 tablet (75 mg total) by mouth daily. 90 tablet 1  . gabapentin (NEURONTIN) 300 MG capsule Take 1 capsule (300 mg total) by mouth 3 (three) times daily. 90 capsule 1  . lisinopril (PRINIVIL,ZESTRIL) 10 MG tablet Take 1 tablet (10 mg total) by mouth daily. 90 tablet 1  . simvastatin (ZOCOR) 5 MG tablet Take 1 tablet (5 mg total) by mouth at bedtime. 100 tablet 3  . vitamin B-12 (CYANOCOBALAMIN) 500 MCG tablet Take 500 mcg by mouth daily.    . vitamin C (ASCORBIC ACID) 500 MG tablet Take 500 mg by mouth daily.     No facility-administered medications prior to visit.     ROS Review of Systems  Constitutional: Positive for fatigue. Negative for chills, fever and unexpected weight change.  HENT: Negative.   Eyes: Negative for photophobia and visual disturbance.  Respiratory: Negative for chest tightness, shortness of breath and wheezing.   Cardiovascular: Negative for chest pain, palpitations and leg swelling.  Gastrointestinal: Negative.   Endocrine: Negative for polyphagia and polyuria.  Skin: Negative  for color change and rash.  Neurological: Negative for dizziness, light-headedness and headaches.  Hematological: Does not bruise/bleed easily.  Psychiatric/Behavioral: Negative.     Objective:  BP 136/62   Pulse (!) 43   Ht 5\' 10"  (1.778 m)   Wt 186 lb (84.4 kg)   SpO2 96%   BMI 26.69 kg/m   BP Readings from Last 3 Encounters:  03/17/18 136/62  01/22/18 (!) 120/58  01/14/18 (!) 167/62    Wt Readings from Last 3 Encounters:  03/17/18 186 lb (84.4 kg)  01/22/18 187 lb (84.8 kg)  01/14/18 187 lb 9.6 oz (85.1 kg)    Physical Exam  Constitutional: He is oriented to person, place, and time. He appears well-developed and well-nourished. No distress.  HENT:  Head: Normocephalic and atraumatic.  Left Ear: External ear normal.  Mouth/Throat: Oropharynx is clear and moist. No oropharyngeal exudate.  Eyes: Pupils are equal, round, and reactive to light. Conjunctivae and EOM are normal. Right eye exhibits no discharge. Left eye exhibits no discharge. No scleral icterus.  Neck: No JVD present. No tracheal deviation present.  Cardiovascular: Regular rhythm and normal heart sounds. Bradycardia present.  Pulmonary/Chest: Effort normal and breath sounds normal.  Abdominal: Bowel sounds are normal.  Neurological: He is alert and oriented to person, place, and time.  Skin: Skin is warm and dry. He is not diaphoretic.  Psychiatric: He has a normal mood and affect. His behavior is normal.  Lab Results  Component Value Date   WBC 7.1 12/10/2017   HGB 13.2 12/10/2017   HCT 41.1 12/10/2017   PLT 255.0 12/10/2017   GLUCOSE 98 12/10/2017   CHOL 134 12/10/2017   TRIG 114.0 12/10/2017   HDL 36.00 (L) 12/10/2017   LDLCALC 75 12/10/2017   ALT 9 12/10/2017   AST 11 12/10/2017   NA 137 12/10/2017   K 4.8 12/19/2017   CL 106 12/10/2017   CREATININE 1.11 12/10/2017   BUN 27 (H) 12/10/2017   CO2 24 12/10/2017   TSH 2.92 06/27/2016   MICROALBUR 1.2 12/10/2017    No results  found.  Assessment & Plan:   [name] was seen today for follow-up.  Diagnoses and all orders for this visit:  Essential hypertension -     Basic metabolic panel  Bradycardia -     EKG 12-Lead -     Ambulatory referral to Cardiology   I am having [name] maintain his acetaminophen, vitamin B-12, vitamin C, Vitamin D3, simvastatin, clopidogrel, gabapentin, lisinopril, and amLODipine.  No orders of the defined types were placed in this encounter.  Patient's pulse rate is always been on the lower side.  When I asked him about it he said that he has been measuring in the 40s at home with his home monitor.  He has noted lack of energy but does deny lightheadedness or dizziness when standing.  There is been no shortness of breath or dyspnea on exertion.  EKG confirms sinus bradycardia.  Follow-up: Return in about 1 month (around 04/17/2018).  Libby Maw, MD

## 2018-03-19 ENCOUNTER — Other Ambulatory Visit: Payer: Self-pay

## 2018-03-19 ENCOUNTER — Ambulatory Visit: Payer: Medicare HMO | Admitting: Neurology

## 2018-03-19 ENCOUNTER — Encounter

## 2018-03-19 DIAGNOSIS — E875 Hyperkalemia: Secondary | ICD-10-CM

## 2018-03-20 ENCOUNTER — Other Ambulatory Visit (INDEPENDENT_AMBULATORY_CARE_PROVIDER_SITE_OTHER): Payer: Medicare HMO

## 2018-03-20 DIAGNOSIS — E875 Hyperkalemia: Secondary | ICD-10-CM

## 2018-03-20 LAB — BASIC METABOLIC PANEL
BUN: 29 mg/dL — AB (ref 6–23)
CO2: 22 mEq/L (ref 19–32)
CREATININE: 1.3 mg/dL (ref 0.40–1.50)
Calcium: 9.5 mg/dL (ref 8.4–10.5)
Chloride: 107 mEq/L (ref 96–112)
GFR: 55.32 mL/min — AB (ref >60.00)
Glucose, Bld: 101 mg/dL — ABNORMAL HIGH (ref 70–99)
Potassium: 5.2 mEq/L — ABNORMAL HIGH (ref 3.5–5.1)
Sodium: 136 mEq/L (ref 135–145)

## 2018-03-24 ENCOUNTER — Telehealth: Payer: Self-pay

## 2018-03-24 NOTE — Telephone Encounter (Signed)
I called to go over patient's lab results with him and he has still not heard back from Cardiology on his referral. Can one of you check into this? Thank you guys!

## 2018-04-04 ENCOUNTER — Encounter: Payer: Self-pay | Admitting: Cardiology

## 2018-04-04 ENCOUNTER — Ambulatory Visit: Payer: Medicare HMO | Admitting: Cardiology

## 2018-04-04 VITALS — BP 130/60 | HR 40 | Ht 70.0 in | Wt 188.0 lb

## 2018-04-04 DIAGNOSIS — E875 Hyperkalemia: Secondary | ICD-10-CM | POA: Diagnosis not present

## 2018-04-04 DIAGNOSIS — I10 Essential (primary) hypertension: Secondary | ICD-10-CM | POA: Diagnosis not present

## 2018-04-04 DIAGNOSIS — R001 Bradycardia, unspecified: Secondary | ICD-10-CM

## 2018-04-04 MED ORDER — HYDROCHLOROTHIAZIDE 25 MG PO TABS: 12.5000 mg | ORAL_TABLET | Freq: Every day | ORAL | 2 refills | Status: DC

## 2018-04-04 NOTE — Addendum Note (Signed)
Addended by: Tyrone Apple on: 04/04/2018 03:48 PM   Modules accepted: Orders

## 2018-04-04 NOTE — Patient Instructions (Addendum)
Medication Instructions:  Your physician has recommended you make the following change in your medication:  Stop lisinopril.   Start  12.5mg  of hydrochlorothiazide daily. ( 1/2 tablet)     Labwork: Your physician recommends that you TSH  Done today .  Bmp to be done in 1 week.    Testing/Procedures: Your physician has recommended that you wear a holter monitor. Holter monitors are medical devices that record the heart's electrical activity. Doctors most often use these monitors to diagnose arrhythmias. Arrhythmias are problems with the speed or rhythm of the heartbeat. The monitor is a small, portable device. You can wear one while you do your normal daily activities. This is usually used to diagnose what is causing palpitations/syncope (passing out).  Your physician has requested that you have an echocardiogram. Echocardiography is a painless test that uses sound waves to create images of your heart. It provides your doctor with information about the size and shape of your heart and how well your heart's chambers and valves are working. This procedure takes approximately one hour. There are no restrictions for this procedure    Follow-Up: Your physician wants you to follow-up in:6 months . You will receive a reminder letter in the mail two months in advance. If you don't receive a letter, please call our office to schedule the follow-up appointment.  Pulse , blood pressure and bmp in 1 week.         Holter Monitoring A Holter monitor is a small device that is used to detect abnormal heart rhythms. It clips to your clothing and is connected by wires to flat, sticky disks (electrodes) that attach to your chest. It is worn continuously for 24-48 hours. Follow these instructions at home:  Wear your Holter monitor at all times, even while exercising and sleeping, for as long as directed by your health care provider.  Make sure that the Holter monitor is safely clipped to your clothing  or close to your body as recommended by your health care provider.  Do not get the monitor or wires wet.  Do not put body lotion or moisturizer on your chest.  Keep your skin clean.  Keep a diary of your daily activities, such as walking and doing chores. If you feel that your heartbeat is abnormal or that your heart is fluttering or skipping a beat: ? Record what you are doing when it happens. ? Record what time of day the symptoms occur.  Return your Holter monitor as directed by your health care provider.  Keep all follow-up visits as directed by your health care provider. This is important. Get help right away if:  You feel lightheaded or you faint.  You have trouble breathing.  You feel pain in your chest, upper arm, or jaw.  You feel sick to your stomach and your skin is pale, cool, or damp.  You heartbeat feels unusual or abnormal. This information is not intended to replace advice given to you by your health care provider. Make sure you discuss any questions you have with your health care provider. Document Released: 04/13/2004 Document Revised: 12/22/2015 Document Reviewed: 02/22/2014 Elsevier Interactive Patient Education  2018 [employer].  Aliskiren; Hydrochlorothiazide, HCTZ Tablet What is this medicine? ALISKIREN; HYDROCHLOROTHIAZIDE (a lis KYE ren; hye droe klor oh THYE a zide) is a combination of a renin inhibitor and a diuretic. It is used to treat high blood pressure. This medicine may be used for other purposes; ask your health care provider or pharmacist if  you have questions. COMMON BRAND NAME(S): Tekturna HCT What should I tell my health care provider before I take this medicine? They need to know if you have any of these conditions: -dehydration -diabetes -gout -kidney disease or kidney stones -liver disease -pancreatitis -small amount of urine or difficulty passing urine -an unusual or allergic reaction to aliskiren, hydrochlorothiazide, HCTZ,  other medicines, foods, dyes, or preservatives -pregnant or trying to get pregnant -breast-feeding How should I use this medicine? Take this medicine by mouth with a glass of water. Follow the directions on your prescription label. You can take this medicine with or without food. However, you should always take it the same way each time. Take your medicine at regular intervals. Do not take it more often than directed. Do not stop taking except on your doctor's advice. Talk to your pediatrician regarding the use of this medicine in children. Special care may be needed. Overdosage: If you think you have taken too much of this medicine contact a poison control center or emergency room at once. NOTE: This medicine is only for you. Do not share this medicine with others. What if I miss a dose? If you miss a dose, take it as soon as you can. If it is almost time for your next dose, take only that dose. Do not take double or extra doses. What may interact with this medicine? -alcohol -atorvastatin -barbiturates -cholestyramine -colestipol -digoxin -dofetilide -furosemide -irbesartan -lithium -medicines for blood pressure -medicines for diabetes -medicines for fungal infections like ketoconazole -medicines that relax muscles for surgery -narcotic medicines for pain -NSAIDs, medicines for pain and inflammation, like ibuprofen or naproxen -potassium supplements -steroid medicines like prednisone or cortisone This list may not describe all possible interactions. Give your health care provider a list of all the medicines, herbs, non-prescription drugs, or dietary supplements you use. Also tell them if you smoke, drink alcohol, or use illegal drugs. Some items may interact with your medicine. What should I watch for while using this medicine? Visit your doctor for regular check ups. Check your blood pressure as directed. Ask your doctor what your blood pressure should be and when you should contact  him or her. This medicine may affect your blood sugar level. If you have diabetes, check with your doctor or health care professional before changing the dose of your diabetic medicine. Do not take this medicine if you have diabetes and are taking a medicine called an angiotensin-receptor-blocker (ARB) or angiotensin-converting-enzyme-inhibitor (ACE inhibitor). Talk to your doctor or health care professional for more information. Women should inform their doctor if they wish to become pregnant or think they might be pregnant. There is a potential for serious side effects to an unborn child. Talk to your health care professional or pharmacist for more information. You may need to be on a special diet while taking this medicine. Ask your doctor. Check with your doctor or health care professional if you get an attack of severe diarrhea, nausea and vomiting, or if you sweat a lot. The loss of too much body fluid can make it dangerous for you to take this medicine. You may get drowsy or dizzy. Do not drive, use machinery, or do anything that needs mental alertness until you know how this medicine affects you. Do not stand or sit up quickly, especially if you are an older patient. This reduces the risk of dizzy or fainting spells. Alcohol may interfere with the effect of this medicine. Avoid alcoholic drinks. This medicine can make you more  sensitive to the sun. Keep out of the sun. If you cannot avoid being in the sun, wear protective clothing and use sunscreen. Do not use sun lamps or tanning beds/booths. What side effects may I notice from receiving this medicine? Side effects that you should report to your doctor or health care professional as soon as possible: -allergic reactions like skin rash or hives, swelling of the hands, feet, face, lips, throat, or tongue -breathing problems -changes in vision -eye pain -fast, irregular heartbeat -feeling faint or lightheaded, falls -fever or sore throat -gout  pain -low blood pressure -muscle pain or cramps -pain, tingling, numbness in the hands or feet -pain or difficulty passing urine -redness, blistering, peeling or loosening of the skin, including inside the mouth -seizures -unusually weak or tired Side effects that usually do not require medical attention (report to your doctor or health care professional if they continue or are bothersome): -change in sex drive or performance -cough -diarrhea -dizziness -dry mouth -flu-like symptoms -headache -stomach upset This list may not describe all possible side effects. Call your doctor for medical advice about side effects. You may report side effects to FDA at 1-800-FDA-1088. Where should I keep my medicine? Keep out of the reach of children. Store at room temperature between 15 and 30 degrees C (59 and 86 degrees F). Protect from moisture. Throw away any unused medicine after the expiration date. NOTE: This sheet is a summary. It may not cover all possible information. If you have questions about this medicine, talk to your doctor, pharmacist, or health care provider.  2018 Elsevier/Gold Standard (2010-11-17 14:56:24)  If you need a refill on your cardiac medications before your next appointment, please call your pharmacy.

## 2018-04-04 NOTE — Progress Notes (Signed)
Cardiology Office Note:    Date:  04/04/2018   ID:  [name], DOB [birth year], MRN [id number]  PCP:  Libby Maw, MD  Cardiologist:  Jenean Lindau, MD   Referring MD: Libby Maw,*    ASSESSMENT:    1. Bradycardia   2. Essential hypertension   3. Hyperkalemia    PLAN:    In order of problems listed above:  1. Stressed with the patient.  Importance of compliance with diet and medication stressed and he vocalized understanding.  His blood pressure is stable.  Diet was discussed. 2. His blood pressure is stable.  He was found to be hyperkalemic at times and his blood work so I would prefer that he not take the ACE inhibitor.  Have stopped his lisinopril and changed him to hydrochlorothiazide 25 mg half tablet daily.  He will be back in a week for a pulse blood pressure check and a Chem-7. 3. Echocardiogram will be done to assess murmur heard on auscultation. 4. In view of his bradycardia I will obtain a TSH and also a 48-hour monitor. 5. Patient will be seen in follow-up appointment in 6 months or earlier if the patient has any concerns 6. He knows to go to the nearest emergency room for any significant concerns.   Medication Adjustments/Labs and Tests Ordered: Current medicines are reviewed at length with the patient today.  Concerns regarding medicines are outlined above.  No orders of the defined types were placed in this encounter.  No orders of the defined types were placed in this encounter.    History of Present Illness:    [name] is a 84 y.o. male who is being seen today for the evaluation of bradycardia at the request of Libby Maw,*.  Patient is a pleasant 84 year old male.  He has past medical history of essential hypertension.  His primary care physician is he was found to be bradycardic.  He denies any chest pain orthopnea or PND.  He takes care of activities of daily living.  He lost his wife early in March and is a  very supportive family.  At the time of my evaluation, the patient is alert awake oriented and in no distress.  Patient does not give any history of dizziness such issues that may be related to bradycardia.  No history of syncope.  Past Medical History:  Diagnosis Date  . Branch retinal vein occlusion of left eye   . High cholesterol   . Hypertension   . TIA (transient ischemic attack)     Past Surgical History:  Procedure Laterality Date  . CAROTID ENDARTERECTOMY Right   . HERNIA REPAIR    . INNER EAR SURGERY Right    for hearing loss    Current Medications: Current Meds  Medication Sig  . acetaminophen (TYLENOL) 500 MG tablet Take 500 mg by mouth every 6 (six) hours as needed.  [name] amLODipine (NORVASC) 10 MG tablet Take 1 tablet (10 mg total) by mouth daily.  . Cholecalciferol (VITAMIN D3) 2000 units TABS Take 1 tablet by mouth daily.  . clopidogrel (PLAVIX) 75 MG tablet Take 1 tablet (75 mg total) by mouth daily.  [name] gabapentin (NEURONTIN) 300 MG capsule Take 1 capsule (300 mg total) by mouth 3 (three) times daily.  [name] lisinopril (PRINIVIL,ZESTRIL) 10 MG tablet Take 1 tablet (10 mg total) by mouth daily.  . simvastatin (ZOCOR) 5 MG tablet Take 1 tablet (5 mg total) by mouth at bedtime.  [name]  vitamin B-12 (CYANOCOBALAMIN) 500 MCG tablet Take 500 mcg by mouth daily.  . vitamin C (ASCORBIC ACID) 500 MG tablet Take 500 mg by mouth daily.     Allergies:   Codeine and Penicillins   Social History   Socioeconomic History  . Marital status: Widowed    Spouse name: Not on file  . Number of children: 4  . Years of education: Not on file  . 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 Financial resource strain: Not on file  . Food insecurity:    Worry: Not on file    Inability: Not on file  . Transportation needs:    Medical: Not on file    Non-medical: Not on file  Tobacco Use  . Smoking status:  Never Smoker  . Smokeless tobacco: Never Used  Substance and Sexual Activity  . Alcohol use: Yes    Comment: 1 glass wine  . Drug use: Never  . Sexual activity: Not on file  Lifestyle  . Physical activity:    Days per week: Not on file    Minutes per session: Not on file  . Stress: Not on file  Relationships  . Social connections:    Talks on phone: Not on file    Gets together: Not on file    Attends religious service: Not on file    Active member of club or organization: Not on file    Attends meetings of clubs or organizations: Not on file    Relationship status: Not on file  Other Topics Concern  . Not on file  Social History Narrative   Lives alone     Family History: The patient's family history includes Lung cancer in his father.  ROS:   Please see the history of present illness.    All other systems reviewed and are negative.  EKGs/Labs/Other Studies Reviewed:    The following studies were reviewed today: EKG reveals sinus bradycardia and nonspecific ST changes.   Recent Labs: 12/10/2017: ALT 9; Hemoglobin 13.2; Platelets 255.0 03/20/2018: BUN 29; Creatinine, Ser 1.30; Potassium 5.2; Sodium 136  Recent Lipid Panel    Component Value Date/Time   CHOL 134 12/10/2017 1043   TRIG 114.0 12/10/2017 1043   HDL 36.00 (L) 12/10/2017 1043   CHOLHDL 4 12/10/2017 1043   VLDL 22.8 12/10/2017 1043   LDLCALC 75 12/10/2017 1043    Physical Exam:    VS:  BP 130/60 (BP Location: Right Arm, Patient Position: Sitting, Cuff Size: Normal)   Pulse (!) 40   Ht 5\' 10"  (1.778 m)   Wt 188 lb (85.3 kg)   SpO2 97%   BMI 26.98 kg/m     Wt Readings from Last 3 Encounters:  04/04/18 188 lb (85.3 kg)  03/17/18 186 lb (84.4 kg)  01/22/18 187 lb (84.8 kg)     GEN: Patient is in no acute distress HEENT: Normal NECK: No JVD; No carotid bruits LYMPHATICS: No lymphadenopathy CARDIAC: S1 S2 regular, 2/6 systolic murmur at the apex. RESPIRATORY:  Clear to auscultation without  rales, wheezing or rhonchi  ABDOMEN: Soft, non-tender, non-distended MUSCULOSKELETAL:  No edema; No deformity  SKIN: Warm and dry NEUROLOGIC:  Alert and oriented x 3 PSYCHIATRIC:  Normal affect    Signed, Jenean Lindau, MD  04/04/2018 3:23 PM    [facility]

## 2018-04-05 LAB — TSH: TSH: 2.65 u[IU]/mL (ref 0.450–4.500)

## 2018-04-06 ENCOUNTER — Other Ambulatory Visit: Payer: Self-pay | Admitting: Family Medicine

## 2018-04-07 ENCOUNTER — Ambulatory Visit (HOSPITAL_BASED_OUTPATIENT_CLINIC_OR_DEPARTMENT_OTHER)
Admission: RE | Admit: 2018-04-07 | Discharge: 2018-04-07 | Disposition: A | Discharge status: Home / Self Care | Payer: Medicare HMO | Source: Ambulatory Visit | Attending: Cardiology | Admitting: Cardiology

## 2018-04-07 ENCOUNTER — Ambulatory Visit: Payer: Medicare HMO

## 2018-04-07 ENCOUNTER — Telehealth: Payer: Self-pay | Admitting: Family Medicine

## 2018-04-07 DIAGNOSIS — I119 Hypertensive heart disease without heart failure: Secondary | ICD-10-CM | POA: Insufficient documentation

## 2018-04-07 DIAGNOSIS — I10 Essential (primary) hypertension: Secondary | ICD-10-CM

## 2018-04-07 DIAGNOSIS — I272 Pulmonary hypertension, unspecified: Secondary | ICD-10-CM | POA: Diagnosis not present

## 2018-04-07 DIAGNOSIS — R001 Bradycardia, unspecified: Secondary | ICD-10-CM

## 2018-04-07 DIAGNOSIS — Z8673 Personal history of transient ischemic attack (TIA), and cerebral infarction without residual deficits: Secondary | ICD-10-CM | POA: Diagnosis not present

## 2018-04-07 DIAGNOSIS — I083 Combined rheumatic disorders of mitral, aortic and tricuspid valves: Secondary | ICD-10-CM | POA: Diagnosis not present

## 2018-04-07 DIAGNOSIS — E785 Hyperlipidemia, unspecified: Secondary | ICD-10-CM | POA: Diagnosis not present

## 2018-04-07 NOTE — Progress Notes (Signed)
  Echocardiogram 2D Echocardiogram has been performed.  Pamela Intrieri T Criss Pallone 04/07/2018, 11:29 AM

## 2018-04-07 NOTE — Telephone Encounter (Signed)
Copied from [place] [phone] [phone]. Topic: Quick Communication - Rx Refill/Question >> Apr 07, 2018  4:17 PM Oliver Pila B wrote: Medication: gabapentin (NEURONTIN) 300 MG capsule [[id number]]   Has the patient contacted their pharmacy? Yes.   (Agent: If no, request that the patient contact the pharmacy for the refill.) (Agent: If yes, when and what did the pharmacy advise?)  Preferred Pharmacy (with phone number or street name): [facility]  Agent: Please be advised that RX refills may take up to 3 business days. We ask that you follow-up with your pharmacy.

## 2018-04-08 ENCOUNTER — Telehealth: Payer: Self-pay

## 2018-04-08 NOTE — Telephone Encounter (Signed)
Patient was called and notified of results. 

## 2018-04-08 NOTE — Telephone Encounter (Signed)
-----   Message from Jenean Lindau, MD sent at 04/08/2018  8:33 AM EDT ----- The results of the study is unremarkable. Please inform patient. I will discuss in detail at next appointment. Cc  primary care/referring physician Jenean Lindau, MD 04/08/2018 8:33 AM

## 2018-04-08 NOTE — Telephone Encounter (Signed)
-----   Message from Jenean Lindau, MD sent at 04/08/2018 10:01 AM EDT ----- The results of the study is unremarkable. Please inform patient. I will discuss in detail at next appointment. Cc  primary care/referring physician Jenean Lindau, MD 04/08/2018 10:01 AM

## 2018-04-10 ENCOUNTER — Other Ambulatory Visit: Payer: Self-pay

## 2018-04-10 DIAGNOSIS — G629 Polyneuropathy, unspecified: Secondary | ICD-10-CM

## 2018-04-10 MED ORDER — GABAPENTIN 300 MG PO CAPS: 300.0000 mg | ORAL_CAPSULE | Freq: Three times a day (TID) | ORAL | 1 refills | Status: DC

## 2018-04-10 NOTE — Telephone Encounter (Signed)
Patient's daughter calling to check and see why the patient has not received this medication refill yet. States that "it is really annoying that it take 3-4 days to get a refill on an important medication." Advised her that We received the refill request late on Monday evening and that we are waiting for provider's approval and once we get that approval, the medication will be sent to the pharmacy.

## 2018-04-10 NOTE — Telephone Encounter (Signed)
Medication refilled

## 2018-04-10 NOTE — Telephone Encounter (Signed)
Informed pt of refill

## 2018-04-11 ENCOUNTER — Ambulatory Visit: Payer: Medicare HMO | Admitting: Cardiology

## 2018-04-11 VITALS — BP 128/52 | HR 46 | Ht 70.0 in | Wt 183.1 lb

## 2018-04-11 DIAGNOSIS — I10 Essential (primary) hypertension: Secondary | ICD-10-CM

## 2018-04-11 NOTE — Patient Instructions (Signed)
Medication Instructions:  Your physician recommends that you continue on your current medications as directed. Please refer to the Current Medication list given to you today.  Please resume your cholesterol medications  Labwork: Your physician recommends that you have the following labs drawn: BMP today  Testing/Procedures: None  Follow-Up: Your physician recommends that you schedule a follow-up appointment in: as directed previously  Any Other Special Instructions Will Be Listed Below (If Applicable).     If you need a refill on your cardiac medications before your next appointment, please call your pharmacy.   [place], RN, BSN

## 2018-04-11 NOTE — Progress Notes (Signed)
Patient presented today for visit regarding recent medication change. Patient was recently started on 12.5 mg of HCTZ. Pulse continues to be low today, per the patient he "feels fine." Per Dr. Geraldo Pitter no changes to be made at this time. BMP drawn today. Encouraged patient to resume statin as he was under the impression he was to d/c this medication.

## 2018-04-12 LAB — BASIC METABOLIC PANEL
BUN/Creatinine Ratio: 20 (ref 10–24)
BUN: 29 mg/dL — AB (ref 8–27)
CALCIUM: 9.5 mg/dL (ref 8.6–10.2)
CHLORIDE: 104 mmol/L (ref 96–106)
CO2: 20 mmol/L (ref 20–29)
Creatinine, Ser: 1.45 mg/dL — ABNORMAL HIGH (ref 0.76–1.27)
GFR calc Af Amer: 49 mL/min/{1.73_m2} — ABNORMAL LOW (ref >59)
GFR calc non Af Amer: 43 mL/min/{1.73_m2} — ABNORMAL LOW (ref >59)
Glucose: 94 mg/dL (ref 65–99)
Potassium: 6.1 mmol/L — ABNORMAL HIGH (ref 3.5–5.2)
Sodium: 138 mmol/L (ref 134–144)

## 2018-04-14 ENCOUNTER — Other Ambulatory Visit: Payer: Self-pay

## 2018-04-17 ENCOUNTER — Encounter: Payer: Self-pay | Admitting: Family Medicine

## 2018-04-17 ENCOUNTER — Ambulatory Visit (INDEPENDENT_AMBULATORY_CARE_PROVIDER_SITE_OTHER): Payer: Medicare HMO | Admitting: Family Medicine

## 2018-04-17 VITALS — BP 124/60 | HR 47 | Ht 70.0 in | Wt 183.0 lb

## 2018-04-17 DIAGNOSIS — E875 Hyperkalemia: Secondary | ICD-10-CM | POA: Diagnosis not present

## 2018-04-17 DIAGNOSIS — G629 Polyneuropathy, unspecified: Secondary | ICD-10-CM | POA: Diagnosis not present

## 2018-04-17 DIAGNOSIS — N183 Chronic kidney disease, stage 3 unspecified: Secondary | ICD-10-CM

## 2018-04-17 DIAGNOSIS — N1831 Chronic kidney disease, stage 3a: Secondary | ICD-10-CM | POA: Insufficient documentation

## 2018-04-17 LAB — BASIC METABOLIC PANEL
BUN: 45 mg/dL — ABNORMAL HIGH (ref 6–23)
CHLORIDE: 108 meq/L (ref 96–112)
CO2: 24 mEq/L (ref 19–32)
CREATININE: 1.33 mg/dL (ref 0.40–1.50)
Calcium: 9 mg/dL (ref 8.4–10.5)
GFR: 53.87 mL/min — ABNORMAL LOW (ref >60.00)
Glucose, Bld: 100 mg/dL — ABNORMAL HIGH (ref 70–99)
Potassium: 5 mEq/L (ref 3.5–5.1)
SODIUM: 139 meq/L (ref 135–145)

## 2018-04-17 LAB — MAGNESIUM: MAGNESIUM: 2.4 mg/dL (ref 1.5–2.5)

## 2018-04-17 NOTE — Progress Notes (Addendum)
Subjective:  Patient ID: [name], male    DOB: 1928-01-29  Age: 84 y.o. MRN: [id number]  CC: Follow-up   HPI [name] presents for follow-up of his peripheral neuropathy.  Tells me that [facility] ran short on some of his Neurontin and he was unable to take it.  It was at this time that he realized how much he needed the Neurontin.  His supplies been reestablished to things are much better.  Recent cardiology visit showed significant hyperkalemia with a decline in the GFR.  He drinks coffee tea of 1 glass of wine at night.  He is not drinking water on a regular basis.  Assures me that he is taking no exogenous potassium.  We discussed all of his supplements.  He does enjoy fruit.  He has 1 cup of coffee with breakfast 1 glass of tea with lunch and 1 glass of wine with his dinner meal.  Outpatient Medications Prior to Visit  Medication Sig Dispense Refill  . acetaminophen (TYLENOL) 500 MG tablet Take 500 mg by mouth every 6 (six) hours as needed.    [name] amLODipine (NORVASC) 10 MG tablet Take 1 tablet (10 mg total) by mouth daily. 90 tablet 1  . Cholecalciferol (VITAMIN D3) 2000 units TABS Take 1 tablet by mouth daily.    . clopidogrel (PLAVIX) 75 MG tablet Take 1 tablet (75 mg total) by mouth daily. 90 tablet 1  . gabapentin (NEURONTIN) 300 MG capsule Take 1 capsule (300 mg total) by mouth 3 (three) times daily. 90 capsule 1  . simvastatin (ZOCOR) 5 MG tablet Take 1 tablet (5 mg total) by mouth at bedtime. 100 tablet 3  . vitamin B-12 (CYANOCOBALAMIN) 500 MCG tablet Take 500 mcg by mouth daily.    . vitamin C (ASCORBIC ACID) 500 MG tablet Take 500 mg by mouth daily.     No facility-administered medications prior to visit.     ROS Review of Systems  Constitutional: Negative for chills, fatigue, fever and unexpected weight change.  HENT: Negative.   Eyes: Negative for photophobia and visual disturbance.  Respiratory: Negative.   Cardiovascular: Negative.   Gastrointestinal:  Negative.   Endocrine: Negative for polyphagia and polyuria.  Genitourinary: Negative for decreased urine volume, difficulty urinating, hematuria and urgency.  Musculoskeletal: Negative for joint swelling and myalgias.  Skin: Negative for color change and pallor.  Allergic/Immunologic: Negative for immunocompromised state.  Neurological: Negative for dizziness, light-headedness and headaches.  Hematological: Does not bruise/bleed easily.  Psychiatric/Behavioral: Negative.     Objective:  BP 124/60   Pulse (!) 47   Ht 5\' 10"  (1.778 m)   Wt 183 lb (83 kg)   SpO2 98%   BMI 26.26 kg/m   BP Readings from Last 3 Encounters:  04/21/18 (!) 148/60  04/17/18 124/60  04/11/18 (!) 128/52    Wt Readings from Last 3 Encounters:  04/21/18 188 lb (85.3 kg)  04/17/18 183 lb (83 kg)  04/11/18 183 lb 1.3 oz (83 kg)    Physical Exam  Constitutional: He is oriented to person, place, and time. He appears well-developed and well-nourished.  HENT:  Head: Normocephalic and atraumatic.  Right Ear: External ear normal.  Left Ear: External ear normal.  Eyes: Right eye exhibits no discharge. Left eye exhibits no discharge. No scleral icterus.  Neck: No tracheal deviation present.  Pulmonary/Chest: Effort normal.  Neurological: He is alert and oriented to person, place, and time.  Skin: Skin is warm and dry. He is  not diaphoretic.  Psychiatric: He has a normal mood and affect. His behavior is normal.    Lab Results  Component Value Date   WBC 7.1 12/10/2017   HGB 13.2 12/10/2017   HCT 41.1 12/10/2017   PLT 255.0 12/10/2017   GLUCOSE 96 04/21/2018   CHOL 134 12/10/2017   TRIG 114.0 12/10/2017   HDL 36.00 (L) 12/10/2017   LDLCALC 75 12/10/2017   ALT 9 12/10/2017   AST 11 12/10/2017   NA 140 04/21/2018   K 5.2 (H) 04/25/2018   CL 105 04/21/2018   CREATININE 1.31 (H) 04/21/2018   BUN 31 (H) 04/21/2018   CO2 20 04/21/2018   TSH 2.650 04/04/2018   MICROALBUR 1.2 12/10/2017    No  results found.  Assessment & Plan:   Yeudiel was seen today for follow-up.  Diagnoses and all orders for this visit:  Hyperkalemia -     Basic metabolic panel -     Magnesium -     Cancel: Aldosterone + renin activity w/ ratio -     Cortisol  CKD (chronic kidney disease) stage 3, GFR 30-59 ml/min (HCC) -     Basic metabolic panel -     Magnesium  Neuropathy -     Magnesium   I am having [name] maintain his acetaminophen, vitamin B-12, vitamin C, Vitamin D3, simvastatin, clopidogrel, amLODipine, and gabapentin.  No orders of the defined types were placed in this encounter.  Encourage patient to drink water.  He will not pump his fist for before his blood draws.  He is taking no potassium supplementation but does enjoy his fruits.  Consider renal consult if hyperkalemia persists.  Anticipatory guidance was given to patient with regarding hyperkalemia.  Follow-up will pend the results of his lab studies today. Follow-up: Return in about 1 month (around 05/17/2018).  Libby Maw, MD

## 2018-04-17 NOTE — Patient Instructions (Signed)
Hyperkalemia °Hyperkalemia is when you have too much potassium in your blood. Potassium is normally removed (excreted) from your body by your kidneys. If there is too much potassium in your blood, it can affect how your heart works. °Follow these instructions at home: °· Take medicines only as told by your doctor. °· Do not take any supplements, natural products, herbs, or vitamins unless your doctor says it is okay. °· Limit your alcohol intake as told by your doctor. °· Stop illegal drug use. If you need help quitting, ask your doctor. °· Keep all follow-up visits as told by your doctor. This is important. °· If you have kidney disease, you may need to follow a low potassium diet. A food specialist (dietitian) can help you. °Contact a doctor if: °· Your heartbeat is not regular or very slow. °· You feel dizzy (light-headed). °· You feel weak. °· You feel sick to your stomach (nauseous). °· You have tingling in your hands or feet. °· You cannot feel your hands or feet. °Get help right away if: °· You are short of breath. °· You have chest pain. °· You pass out (faint). °· You cannot move your muscles. °This information is not intended to replace advice given to you by your health care provider. Make sure you discuss any questions you have with your health care provider. °Document Released: 07/16/2005 Document Revised: 12/22/2015 Document Reviewed: 10/21/2013 °Elsevier Interactive Patient Education © 2018 [employer] Inc. ° °

## 2018-04-21 ENCOUNTER — Ambulatory Visit (INDEPENDENT_AMBULATORY_CARE_PROVIDER_SITE_OTHER): Payer: Medicare HMO | Admitting: Cardiology

## 2018-04-21 VITALS — BP 148/60 | HR 42 | Resp 16 | Ht 70.0 in | Wt 188.0 lb

## 2018-04-21 DIAGNOSIS — I10 Essential (primary) hypertension: Secondary | ICD-10-CM

## 2018-04-21 NOTE — Progress Notes (Signed)
Patient here for labs, bp and pulse after being taken off of hydrochlorothiazide 12.5 mg daily. Blood pressure 148/60. Heart rate notably low at 42 however patient reports it runs low. Dr. Agustin Cree made aware and reccomends only blood work at this time. Patient will have labs drawn and will inform Dr. Geraldo Pitter of this visit.

## 2018-04-21 NOTE — Patient Instructions (Signed)
Medication Instructions:  Your physician recommends that you continue on your current medications as directed. Please refer to the Current Medication list given to you today.   Labwork: Your physician recommends that you return for lab work today: BMP   Testing/Procedures: None.  Follow-Up: Follow up as previously discussed.   Any Other Special Instructions Will Be Listed Below (If Applicable).     If you need a refill on your cardiac medications before your next appointment, please call your pharmacy.

## 2018-04-22 ENCOUNTER — Other Ambulatory Visit (INDEPENDENT_AMBULATORY_CARE_PROVIDER_SITE_OTHER): Payer: Medicare HMO

## 2018-04-22 ENCOUNTER — Telehealth: Payer: Self-pay | Admitting: Family Medicine

## 2018-04-22 ENCOUNTER — Other Ambulatory Visit: Payer: Self-pay

## 2018-04-22 DIAGNOSIS — E875 Hyperkalemia: Secondary | ICD-10-CM | POA: Diagnosis not present

## 2018-04-22 LAB — BASIC METABOLIC PANEL WITH GFR
BUN/Creatinine Ratio: 24 (ref 10–24)
BUN: 31 mg/dL — ABNORMAL HIGH (ref 8–27)
CO2: 20 mmol/L (ref 20–29)
Calcium: 9.1 mg/dL (ref 8.6–10.2)
Chloride: 105 mmol/L (ref 96–106)
Creatinine, Ser: 1.31 mg/dL — ABNORMAL HIGH (ref 0.76–1.27)
GFR calc Af Amer: 56 mL/min/1.73 — ABNORMAL LOW
GFR calc non Af Amer: 48 mL/min/1.73 — ABNORMAL LOW
Glucose: 96 mg/dL (ref 65–99)
Potassium: 6 mmol/L — ABNORMAL HIGH (ref 3.5–5.2)
Sodium: 140 mmol/L (ref 134–144)

## 2018-04-22 LAB — POTASSIUM: Potassium: 5.3 mEq/L — ABNORMAL HIGH (ref 3.5–5.1)

## 2018-04-22 NOTE — Progress Notes (Signed)
Potassium labs drawn. Patients had hand open. Tourniquet was not too tight and was released quickly. 25G butterfly needle was used for draw. -DMG

## 2018-04-22 NOTE — Telephone Encounter (Signed)
Received call from Dr Geraldo Pitter nurse  reporting  high potassium on 04/21/18.  Call place to flow coordinator Sarah at Dr Avel Peace office to have him address this with patient ASAP

## 2018-04-22 NOTE — Telephone Encounter (Signed)
Spoke with Dr. Ethelene Hal, patient will come in today for a re-draw. Patient is aware.

## 2018-04-24 ENCOUNTER — Other Ambulatory Visit: Payer: Self-pay

## 2018-04-24 DIAGNOSIS — E875 Hyperkalemia: Secondary | ICD-10-CM

## 2018-04-24 NOTE — Progress Notes (Signed)
I left patient a detailed voicemail per his dpr to come by the office tomorrow morning to have a repeat potassium drawn. Advised him to call the office back with any questions.

## 2018-04-24 NOTE — Progress Notes (Signed)
Potassium is elevated again. Please return in am for a repeat potassium level.

## 2018-04-25 ENCOUNTER — Other Ambulatory Visit (INDEPENDENT_AMBULATORY_CARE_PROVIDER_SITE_OTHER): Payer: Medicare HMO

## 2018-04-25 DIAGNOSIS — E875 Hyperkalemia: Secondary | ICD-10-CM | POA: Diagnosis not present

## 2018-04-25 LAB — POTASSIUM: POTASSIUM: 5.2 meq/L — AB (ref 3.5–5.1)

## 2018-04-29 NOTE — Addendum Note (Signed)
Addended by: Abelino Derrick A on: 04/29/2018 03:00 PM   Modules accepted: Orders

## 2018-04-29 NOTE — Addendum Note (Signed)
Addended by: Jon Billings on: 04/29/2018 02:50 PM   Modules accepted: Orders

## 2018-05-02 ENCOUNTER — Ambulatory Visit (INDEPENDENT_AMBULATORY_CARE_PROVIDER_SITE_OTHER): Payer: Medicare HMO

## 2018-05-02 ENCOUNTER — Encounter: Payer: Self-pay | Admitting: Family Medicine

## 2018-05-02 ENCOUNTER — Other Ambulatory Visit (INDEPENDENT_AMBULATORY_CARE_PROVIDER_SITE_OTHER): Payer: Medicare HMO

## 2018-05-02 DIAGNOSIS — E875 Hyperkalemia: Secondary | ICD-10-CM

## 2018-05-02 DIAGNOSIS — Z23 Encounter for immunization: Secondary | ICD-10-CM | POA: Diagnosis not present

## 2018-05-02 LAB — CORTISOL: Cortisol, Plasma: 12 ug/dL

## 2018-05-02 NOTE — Progress Notes (Signed)
Pt presented today for influenza vaccination. IM injection given in R deltoid. Pt tolerated well. No S/S observed prior to leaving office. Pt given vaccination information

## 2018-05-19 ENCOUNTER — Encounter: Payer: Self-pay | Admitting: Family Medicine

## 2018-05-19 ENCOUNTER — Ambulatory Visit (INDEPENDENT_AMBULATORY_CARE_PROVIDER_SITE_OTHER): Payer: Medicare HMO | Admitting: Family Medicine

## 2018-05-19 VITALS — BP 120/70 | HR 73 | Ht 70.0 in | Wt 190.5 lb

## 2018-05-19 DIAGNOSIS — I10 Essential (primary) hypertension: Secondary | ICD-10-CM | POA: Diagnosis not present

## 2018-05-19 DIAGNOSIS — N183 Chronic kidney disease, stage 3 unspecified: Secondary | ICD-10-CM

## 2018-05-19 DIAGNOSIS — E875 Hyperkalemia: Secondary | ICD-10-CM | POA: Diagnosis not present

## 2018-05-19 DIAGNOSIS — E7800 Pure hypercholesterolemia, unspecified: Secondary | ICD-10-CM

## 2018-05-19 LAB — BASIC METABOLIC PANEL
BUN: 26 mg/dL — ABNORMAL HIGH (ref 6–23)
CALCIUM: 9 mg/dL (ref 8.4–10.5)
CO2: 23 mEq/L (ref 19–32)
CREATININE: 1.26 mg/dL (ref 0.40–1.50)
Chloride: 107 mEq/L (ref 96–112)
GFR: 57.33 mL/min — ABNORMAL LOW (ref >60.00)
Glucose, Bld: 108 mg/dL — ABNORMAL HIGH (ref 70–99)
Potassium: 4.9 mEq/L (ref 3.5–5.1)
SODIUM: 137 meq/L (ref 135–145)

## 2018-05-19 LAB — LDL CHOLESTEROL, DIRECT: LDL DIRECT: 84 mg/dL

## 2018-05-19 NOTE — Progress Notes (Signed)
Subjective:  Patient ID: [name], male    DOB: [birth year]  Age: 84 y.o. MRN: [id number]  CC: Follow-up   HPI [name] presents for follow-up of his hyperkalemia and chronic kidney disease.  Chronic kidney disease is been stable but his potassium levels have been somewhat labile.  On reflection he does admit to enjoying a high potassium diet.  He is status post consultation with cardiology for his bradycardia.  At this point in time he denies lightheadedness with standing or exercising.  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  He is certain to use his cane when ambulating at this time.  He is feeling well.  He asks about getting the new Zostrix vaccine and I encouraged him to do so.  Outpatient Medications Prior to Visit  Medication Sig Dispense Refill  . acetaminophen (TYLENOL) 500 MG tablet Take 500 mg by mouth every 6 (six) hours as needed.    [name] amLODipine (NORVASC) 10 MG tablet Take 1 tablet (10 mg total) by mouth daily. 90 tablet 1  . Cholecalciferol (VITAMIN D3) 2000 units TABS Take 1 tablet by mouth daily.    . clopidogrel (PLAVIX) 75 MG tablet Take 1 tablet (75 mg total) by mouth daily. 90 tablet 1  . gabapentin (NEURONTIN) 300 MG capsule Take 1 capsule (300 mg total) by mouth 3 (three) times daily. 90 capsule 1  . lisinopril (PRINIVIL,ZESTRIL) 10 MG tablet Take 10 mg by mouth daily.  1  . simvastatin (ZOCOR) 5 MG tablet Take 1 tablet (5 mg total) by mouth at bedtime. 100 tablet 3  . vitamin B-12 (CYANOCOBALAMIN) 500 MCG tablet Take 500 mcg by mouth daily.    . vitamin C (ASCORBIC ACID) 500 MG tablet Take 500 mg by mouth daily.     No facility-administered medications prior to visit.     ROS Review of Systems  Constitutional: Negative.   HENT: Negative.   Eyes: Negative.   Respiratory: Negative.   Cardiovascular: Negative.   Gastrointestinal: Negative.   Endocrine: Negative for polyphagia and polyuria.    Genitourinary: Negative.   Musculoskeletal: Positive for gait problem.  Allergic/Immunologic: Negative for immunocompromised state.  Neurological: Positive for numbness. Negative for weakness.  Hematological: Does not bruise/bleed easily.  Psychiatric/Behavioral: Negative.     Objective:  BP 120/70   Pulse 73   Ht 5\' 10"  (1.778 m)   Wt 190 lb 8 oz (86.4 kg)   SpO2 96%   BMI 27.33 kg/m   BP Readings from Last 3 Encounters:  05/19/18 120/70  04/21/18 (!) 148/60  04/17/18 124/60    Wt Readings from Last 3 Encounters:  05/19/18 190 lb 8 oz (86.4 kg)  04/21/18 188 lb (85.3 kg)  04/17/18 183 lb (83 kg)    Physical Exam  Constitutional: He is oriented to person, place, and time. He appears well-developed and well-nourished. No distress.  HENT:  Head: Normocephalic and atraumatic.  Right Ear: External ear normal.  Left Ear: External ear normal.  Pulmonary/Chest: Effort normal.  Neurological: He is alert and oriented to person, place, and time.  Skin: Skin is warm and dry. He is not diaphoretic.  Psychiatric: He has a normal mood and affect. His behavior is normal.    Lab Results  Component Value Date   WBC 7.1 12/10/2017   HGB 13.2 12/10/2017   HCT 41.1 12/10/2017   PLT 255.0 12/10/2017   GLUCOSE 96 04/21/2018   CHOL  134 12/10/2017   TRIG 114.0 12/10/2017   HDL 36.00 (L) 12/10/2017   LDLCALC 75 12/10/2017   ALT 9 12/10/2017   AST 11 12/10/2017   NA 140 04/21/2018   K 5.2 (H) 04/25/2018   CL 105 04/21/2018   CREATININE 1.31 (H) 04/21/2018   BUN 31 (H) 04/21/2018   CO2 20 04/21/2018   TSH 2.650 04/04/2018   MICROALBUR 1.2 12/10/2017    No results found.  Assessment & Plan:   [name] was seen today for follow-up.  Diagnoses and all orders for this visit:  Essential hypertension -     Basic metabolic panel  CKD (chronic kidney disease) stage 3, GFR 30-59 ml/min (HCC) -     Basic metabolic panel  Hyperkalemia -     Basic metabolic panel  Elevated  cholesterol -     LDL cholesterol, direct   I am having [name] maintain his acetaminophen, vitamin B-12, vitamin C, Vitamin D3, simvastatin, clopidogrel, amLODipine, gabapentin, and lisinopril.  No orders of the defined types were placed in this encounter.  Hopefully patient's potassium has stabilized.  We will see him back in 3 months.  He has adjusted his diet to lower the potassium.  At this time I do not think that his hyperkalemia has a renal.  We will continue to follow his kidney function.  Follow-up: Return in about 3 months (around 08/19/2018), or if symptoms worsen or fail to improve.  Libby Maw, MD

## 2018-05-19 NOTE — Patient Instructions (Signed)
Potassium Content of Foods Potassium is a mineral found in many foods and drinks. It helps keep fluids and minerals balanced in your body and affects how steadily your heart beats. Potassium also helps control your blood pressure and keep your muscles and nervous system healthy. Certain health conditions and medicines may change the balance of potassium in your body. When this happens, you can help balance your level of potassium through the foods that you do or do not eat. Your health care provider or dietitian may recommend an amount of potassium that you should have each day. The following lists of foods provide the amount of potassium (in parentheses) per serving in each item. High in potassium The following foods and beverages have 200 mg or more of potassium per serving:  Apricots, 2 raw or 5 dry (200 mg).  Artichoke, 1 medium (345 mg).  Avocado, raw,  each (245 mg).  Banana, 1 medium (425 mg).  Beans, lima, or baked beans, canned,  cup (280 mg).  Beans, white, canned,  cup (595 mg).  Beef roast, 3 oz (320 mg).  Beef, ground, 3 oz (270 mg).  Beets, raw or cooked,  cup (260 mg).  Bran muffin, 2 oz (300 mg).  Broccoli,  cup (230 mg).  Brussels sprouts,  cup (250 mg).  Cantaloupe,  cup (215 mg).  Cereal, 100% bran,  cup (200-400 mg).  Cheeseburger, single, fast food, 1 each (225-400 mg).  Chicken, 3 oz (220 mg).  Clams, canned, 3 oz (535 mg).  Crab, 3 oz (225 mg).  Dates, 5 each (270 mg).  Dried beans and peas,  cup (300-475 mg).  Figs, dried, 2 each (260 mg).  Fish: halibut, tuna, cod, snapper, 3 oz (480 mg).  Fish: salmon, haddock, swordfish, perch, 3 oz (300 mg).  Fish, tuna, canned 3 oz (200 mg).  French fries, fast food, 3 oz (470 mg).  Granola with fruit and nuts,  cup (200 mg).  Grapefruit juice,  cup (200 mg).  Greens, beet,  cup (655 mg).  Honeydew melon,  cup (200 mg).  Kale, raw, 1 cup (300 mg).  Kiwi, 1 medium (240  mg).  Kohlrabi, rutabaga, parsnips,  cup (280 mg).  Lentils,  cup (365 mg).  Mango, 1 each (325 mg).  Milk, chocolate, 1 cup (420 mg).  Milk: nonfat, low-fat, whole, buttermilk, 1 cup (350-380 mg).  Molasses, 1 Tbsp (295 mg).  Mushrooms,  cup (280) mg.  Nectarine, 1 each (275 mg).  Nuts: almonds, peanuts, hazelnuts, [place], cashew, mixed, 1 oz (200 mg).  Nuts, pistachios, 1 oz (295 mg).  Orange, 1 each (240 mg).  Orange juice,  cup (235 mg).  Papaya, medium,  fruit (390 mg).  Peanut butter, chunky, 2 Tbsp (240 mg).  Peanut butter, smooth, 2 Tbsp (210 mg).  Pear, 1 medium (200 mg).  Pomegranate, 1 whole (400 mg).  Pomegranate juice,  cup (215 mg).  Pork, 3 oz (350 mg).  Potato chips, salted, 1 oz (465 mg).  Potato, baked with skin, 1 medium (925 mg).  Potatoes, boiled,  cup (255 mg).  Potatoes, mashed,  cup (330 mg).  Prune juice,  cup (370 mg).  Prunes, 5 each (305 mg).  Pudding, chocolate,  cup (230 mg).  Pumpkin, canned,  cup (250 mg).  Raisins, seedless,  cup (270 mg).  Seeds, sunflower or pumpkin, 1 oz (240 mg).  Soy milk, 1 cup (300 mg).  Spinach,  cup (420 mg).  Spinach, canned,  cup (370 mg).  Sweet   potato, baked with skin, 1 medium (450 mg).  Swiss chard,  cup (480 mg).  Tomato or vegetable juice,  cup (275 mg).  Tomato sauce or puree,  cup (400-550 mg).  Tomato, raw, 1 medium (290 mg).  Tomatoes, canned,  cup (200-300 mg).  Turkey, 3 oz (250 mg).  Wheat germ, 1 oz (250 mg).  Winter squash,  cup (250 mg).  Yogurt, plain or fruited, 6 oz (260-435 mg).  Zucchini,  cup (220 mg).  Moderate in potassium The following foods and beverages have 50-200 mg of potassium per serving:  Apple, 1 each (150 mg).  Apple juice,  cup (150 mg).  Applesauce,  cup (90 mg).  Apricot nectar,  cup (140 mg).  Asparagus, small spears,  cup or 6 spears (155 mg).  Bagel, cinnamon raisin, 1 each (130 mg).  Bagel,  egg or plain, 4 in., 1 each (70 mg).  Beans, green,  cup (90 mg).  Beans, yellow,  cup (190 mg).  Beer, regular, 12 oz (100 mg).  Beets, canned,  cup (125 mg).  Blackberries,  cup (115 mg).  Blueberries,  cup (60 mg).  Bread, whole wheat, 1 slice (70 mg).  Broccoli, raw,  cup (145 mg).  Cabbage,  cup (150 mg).  Carrots, cooked or raw,  cup (180 mg).  Cauliflower, raw,  cup (150 mg).  Celery, raw,  cup (155 mg).  Cereal, bran flakes, cup (120-150 mg).  Cheese, cottage,  cup (110 mg).  Cherries, 10 each (150 mg).  Chocolate, 1 oz bar (165 mg).  Coffee, brewed 6 oz (90 mg).  Corn,  cup or 1 ear (195 mg).  Cucumbers,  cup (80 mg).  Egg, large, 1 each (60 mg).  Eggplant,  cup (60 mg).  Endive, raw, cup (80 mg).  English muffin, 1 each (65 mg).  Fish, orange roughy, 3 oz (150 mg).  Frankfurter, beef or pork, 1 each (75 mg).  Fruit cocktail,  cup (115 mg).  Grape juice,  cup (170 mg).  Grapefruit,  fruit (175 mg).  Grapes,  cup (155 mg).  Greens: kale, turnip, collard,  cup (110-150 mg).  Ice cream or frozen yogurt, chocolate,  cup (175 mg).  Ice cream or frozen yogurt, vanilla,  cup (120-150 mg).  Lemons, limes, 1 each (80 mg).  Lettuce, all types, 1 cup (100 mg).  Mixed vegetables,  cup (150 mg).  Mushrooms, raw,  cup (110 mg).  Nuts: walnuts, pecans, or macadamia, 1 oz (125 mg).  Oatmeal,  cup (80 mg).  Okra,  cup (110 mg).  Onions, raw,  cup (120 mg).  Peach, 1 each (185 mg).  Peaches, canned,  cup (120 mg).  Pears, canned,  cup (120 mg).  Peas, green, frozen,  cup (90 mg).  Peppers, green,  cup (130 mg).  Peppers, red,  cup (160 mg).  Pineapple juice,  cup (165 mg).  Pineapple, fresh or canned,  cup (100 mg).  Plums, 1 each (105 mg).  Pudding, vanilla,  cup (150 mg).  Raspberries,  cup (90 mg).  Rhubarb,  cup (115 mg).  Rice, wild,  cup (80 mg).  Shrimp, 3 oz (155  mg).  Spinach, raw, 1 cup (170 mg).  Strawberries,  cup (125 mg).  Summer squash  cup (175-200 mg).  Swiss chard, raw, 1 cup (135 mg).  Tangerines, 1 each (140 mg).  Tea, brewed, 6 oz (65 mg).  Turnips,  cup (140 mg).  Watermelon,  cup (85 mg).  Wine, red, table,   5 oz (180 mg).  Wine, white, table, 5 oz (100 mg).  Low in potassium The following foods and beverages have less than 50 mg of potassium per serving.  Bread, white, 1 slice (30 mg).  Carbonated beverages, 12 oz (less than 5 mg).  Cheese, 1 oz (20-30 mg).  Cranberries,  cup (45 mg).  Cranberry juice cocktail,  cup (20 mg).  Fats and oils, 1 Tbsp (less than 5 mg).  Hummus, 1 Tbsp (32 mg).  Nectar: papaya, mango, or pear,  cup (35 mg).  Rice, white or brown,  cup (50 mg).  Spaghetti or macaroni,  cup cooked (30 mg).  Tortilla, flour or corn, 1 each (50 mg).  Waffle, 4 in., 1 each (50 mg).  Water chestnuts,  cup (40 mg).  This information is not intended to replace advice given to you by your health care provider. Make sure you discuss any questions you have with your health care provider. Document Released: 02/27/2005 Document Revised: 12/22/2015 Document Reviewed: 06/12/2013 Elsevier Interactive Patient Education  2018 [employer] Hyperkalemia Hyperkalemia is when you have too much potassium in your blood. Potassium is normally removed (excreted) from your body by your kidneys. If there is too much potassium in your blood, it can affect your heart's ability to function. What are the causes? Hyperkalemia may be caused by:  Taking in too much potassium. You can do this by: ? Using salt substitutes. They contain large amounts of potassium. ? Taking potassium supplements. ? Eating foods high in potassium.  Excreting too little potassium. This can happen if: ? Your kidneys are not working properly. Kidney (renal) disease, including short- or long-term renal failure, is a very common  cause of hyperkalemia. ? You are taking medicines that lower your excretion of potassium. ? You have Addison disease. ? You have a urinary tract blockage, such as kidney stones. ? You are on treatment to mechanically clean your blood (dialysis) and you skip a treatment.  Releasing a high amount of potassium from your cells into your blood. This can happen with: ? Injury to muscles (rhabdomyolysis) or other tissues. Most potassium is stored in your muscles. ? Severe burns or infections. ? Acidic blood plasma (acidosis). Acidosis can result from many diseases, such as uncontrolled diabetes.  What increases the risk? The most common risk factor of hyperkalemia is kidney disease. Other risk factors of hyperkalemia include:  Addison disease. This is a condition where your glands do not produce enough hormones.  Alcoholism or heavy drug use.  Using certain blood pressure medicines, such as angiotensin-converting enzyme (ACE) inhibitors, angiotensin II receptor blockers (ARBs), or potassium-sparing diuretics such as spironolactone.  Severe injury or burn.  What are the signs or symptoms? Oftentimes, there are no signs or symptoms of hyperkalemia. However, when your potassium level becomes high enough, you may experience symptoms such as:  Irregular or very slow heartbeat.  Nausea.  Fatigue.  Tingling of the skin or numbness of the hands or feet.  Muscle weakness.  Fatigue.  Not being able to move (paralysis).  You may not have any symptoms of hyperkalemia. How is this diagnosed? Hyperkalemia may be diagnosed by:  Physical exam.  Blood tests.  ECG (electrocardiogram).  Discussion of prescription and non-prescription drug use.  How is this treated? Treatment for hyperkalemia is often directed at the underlying cause. In some instances, treatment may include:  Insulin.  Glucose (sugar) and water solution given through a vein (intravenous or  IV).  Dialysis.  Medicines to   remove the potassium from your body.  Medicines to move calcium from your bloodstream into your tissues.  Follow these instructions at home:  Take medicines only as directed by your health care provider.  Do not take any supplements, natural products, herbs, or vitamins without reviewing them with your health care provider. Certain supplements and natural food products can have high amounts of potassium.  Limit your alcohol intake as directed by your health care provider.  Stop illegal drug use. If you need help quitting, ask your health care provider.  Keep all follow-up visits as directed by your health care provider. This is important.  If you have kidney disease, you may need to follow a low potassium diet. A dietitian can help educate you on low potassium foods. Contact a health care provider if:  You notice an irregular or very slow heartbeat.  You feel light-headed.  You feel weak.  You are nauseous.  You have tingling or numbness in your hands or feet. Get help right away if:  You have shortness of breath.  You have chest pain or discomfort.  You pass out.  You have muscle paralysis. This information is not intended to replace advice given to you by your health care provider. Make sure you discuss any questions you have with your health care provider. Document Released: 07/06/2002 Document Revised: 12/22/2015 Document Reviewed: 10/21/2013 Elsevier Interactive Patient Education  2018 [employer]  

## 2018-06-17 ENCOUNTER — Other Ambulatory Visit: Payer: Self-pay | Admitting: Family Medicine

## 2018-06-17 DIAGNOSIS — G629 Polyneuropathy, unspecified: Secondary | ICD-10-CM

## 2018-07-07 ENCOUNTER — Ambulatory Visit (HOSPITAL_COMMUNITY)
Admission: RE | Admit: 2018-07-07 | Discharge: 2018-07-07 | Disposition: A | Discharge status: Home / Self Care | Payer: Medicare HMO | Source: Ambulatory Visit | Attending: Family Medicine | Admitting: Family Medicine

## 2018-07-07 ENCOUNTER — Ambulatory Visit (INDEPENDENT_AMBULATORY_CARE_PROVIDER_SITE_OTHER): Payer: Medicare HMO | Admitting: Family

## 2018-07-07 ENCOUNTER — Encounter: Payer: Self-pay | Admitting: Family

## 2018-07-07 VITALS — BP 133/59 | HR 44 | Temp 97.0°F | Resp 12 | Ht 70.0 in | Wt 193.0 lb

## 2018-07-07 DIAGNOSIS — I6523 Occlusion and stenosis of bilateral carotid arteries: Secondary | ICD-10-CM

## 2018-07-07 DIAGNOSIS — Z9889 Other specified postprocedural states: Secondary | ICD-10-CM

## 2018-07-07 NOTE — Patient Instructions (Signed)

## 2018-07-07 NOTE — Progress Notes (Signed)
Chief Complaint: Follow up Extracranial Carotid Artery Stenosis   History of Present Illness  [name] is a 84 y.o. male who is s/p R CEA with Dr. Maryjean Morn in [facility] for a TIA.   He had a TIA at age 68, he denies any subsequent neurological symptoms.    The patient has never had amaurosis fugax.  He has had retinal vein occlusion which required laser intervention.  The patient has never had unilateral facial drooping or hemiplegia.  He had a brief period of L arm weakness.  He had an episode of syncope with expressive aphasia.  The patient's previous neurologic deficits have resolved.  The patient's risks factors for carotid disease include: HLD, HTN.  Dr. Bridgett Larsson last evaluated pt on 01-22-18. At that time there was no stenosis in the right CEA site and 60-79% stenosis in the left ICA. Pt was to return in 6 months with carotid duplex.   His wife died in October 26, 2017, he seems to be adjusting well since then. His oldest daughter has been living with him since then. His youngest son is relocating from [place] to [place].   He denies feeling light headed, denies dyspnea or chest pain.  He does not take a beta blocker.  Pt states his bradycardia has been evaluated by cardiology. Pt states no pacemaker needed.   He states the edema in his lower legs and feet mostly resolve by morning with overnight elevation.   Diabetic: no Tobacco use: non-smoker  Pt meds include: Statin : yes ASA: no Other anticoagulants/antiplatelets: Plavix   Past Medical History:  Diagnosis Date  . Branch retinal vein occlusion of left eye   . High cholesterol   . Hypertension   . TIA (transient ischemic attack)     Social History Social History   Tobacco Use  . Smoking status: Never Smoker  . Smokeless tobacco: Never Used  Substance Use Topics  . Alcohol use: Yes    Comment: 1 glass wine  . Drug use: Never    Family History Family History  Problem Relation Age of Onset  . Lung cancer Father      Surgical History Past Surgical History:  Procedure Laterality Date  . CAROTID ENDARTERECTOMY Right   . HERNIA REPAIR    . INNER EAR SURGERY Right    for hearing loss    Allergies  Allergen Reactions  . Codeine Other (See Comments)    No reaction noted  . Penicillins     REACTION: rash    Current Outpatient Medications  Medication Sig Dispense Refill  . acetaminophen (TYLENOL) 500 MG tablet Take 500 mg by mouth every 6 (six) hours as needed.    [name] amLODipine (NORVASC) 10 MG tablet Take 1 tablet (10 mg total) by mouth daily. 90 tablet 1  . Cholecalciferol (VITAMIN D3) 2000 units TABS Take 1 tablet by mouth daily.    . clopidogrel (PLAVIX) 75 MG tablet Take 1 tablet (75 mg total) by mouth daily. 90 tablet 1  . gabapentin (NEURONTIN) 300 MG capsule TAKE 1 CAPSULE BY MOUTH THREE TIMES DAILY 90 capsule 3  . lisinopril (PRINIVIL,ZESTRIL) 10 MG tablet Take 10 mg by mouth daily.  1  . simvastatin (ZOCOR) 5 MG tablet Take 1 tablet (5 mg total) by mouth at bedtime. 100 tablet 3  . vitamin B-12 (CYANOCOBALAMIN) 500 MCG tablet Take 500 mcg by mouth daily.    . vitamin C (ASCORBIC ACID) 500 MG tablet Take 500 mg by mouth daily.  No current facility-administered medications for this visit.     Review of Systems : See HPI for pertinent positives and negatives.  Physical Examination  Vitals:   07/07/18 1558 07/07/18 1559  BP: (!) 138/55 (!) 133/59  Pulse: (!) 44   Resp: 12   Temp: (!) 97 F (36.1 C)   SpO2: 98%   Weight: 193 lb (87.5 kg)   Height: 5\' 10"  (1.778 m)    Body mass index is 27.69 kg/m.  General: WDWN elderly male in NAD GAIT: steady, using cane Eyes: PERRLA HENT: No gross abnormalities.  Pulmonary:  Respirations are non-labored, fair air movement in all fields, scattered rales in left posterior fields, no rhonchi or wheezes. Cardiac: regular rhythm, bradycardic at 44/minute, no detected murmur.  VASCULAR EXAM Carotid Bruits Right Left   Negative Negative      Abdominal aortic pulse is not palpable. Radial pulses are 2+ palpable and equal.                                                                                                                            LE Pulses Right Left       POPLITEAL  not palpable   not palpable       POSTERIOR TIBIAL  1+ palpable   not palpable        DORSALIS PEDIS      ANTERIOR TIBIAL 1+ palpable  not palpable     Gastrointestinal: soft, nontender, BS WNL, no r/g, no palpable masses. Musculoskeletal: no muscle atrophy/wasting. M/S 5/5 throughout, extremities without ischemic changes. Pitting edema in lower extremities: 1+ bilateral pretibial and right ankle, 2+ left ankle and foot.  Skin: No rashes, no ulcers, no cellulitis.   Neurologic:  A&O X 3; appropriate affect, sensation is normal; speech is normal, CN 2-12 intact, pain and light touch intact in extremities, motor exam as listed above. Psychiatric: Normal thought content, mood appropriate to clinical situation.    Assessment: [name] is a 84 y.o. male who is s/p R CEA with Dr. Maryjean Morn in [facility] for a TIA.   He had a TIA at age 28, he denies any subsequent neurological symptoms.    Fortunately he does not have DM and has never used tobacco. He takes a daily satin and Plavix.   He has neuropathy in his feet with tingling, numbness, burning. He states that he had a spine evaluation, minor problems were found, no surgery needed.     DATA Carotid Duplex (07-07-18): Right ICA: CEA site with no stenosis Left ICA: 40-59% stenosis Right vertebral artery flow is antegrade, left is high resistant.   Bilateral subclavian artery waveforms are normal.  Less stenosis in the left ICA compared to the exam on 01-22-18. Left vertebral artery remains high resistant.    Plan: Follow-up in 9 months with Carotid Duplex scan.   I discussed in depth with the patient the nature of atherosclerosis, and emphasized the importance of maximal  medical  management including strict control of blood pressure, blood glucose, and lipid levels, obtaining regular exercise, and continued cessation of smoking.  The patient is aware that without maximal medical management the underlying atherosclerotic disease process will progress, limiting the benefit of any interventions. The patient was given information about stroke prevention and what symptoms should prompt the patient to seek immediate medical care. Thank you for allowing [place] to participate in this patient's care.  Clemon Chambers, RN, MSN, FNP-C [facility] Office: [facility] Physician: Trula Slade  07/07/18 4:07 PM

## 2018-07-11 ENCOUNTER — Other Ambulatory Visit: Payer: Self-pay | Admitting: Family Medicine

## 2018-07-31 ENCOUNTER — Other Ambulatory Visit: Payer: Self-pay | Admitting: Family Medicine

## 2018-08-19 ENCOUNTER — Encounter: Payer: Self-pay | Admitting: Family Medicine

## 2018-08-19 ENCOUNTER — Ambulatory Visit (INDEPENDENT_AMBULATORY_CARE_PROVIDER_SITE_OTHER): Payer: Medicare HMO | Admitting: Family Medicine

## 2018-08-19 VITALS — BP 136/70 | HR 63 | Ht 70.0 in | Wt 195.4 lb

## 2018-08-19 DIAGNOSIS — N183 Chronic kidney disease, stage 3 unspecified: Secondary | ICD-10-CM

## 2018-08-19 DIAGNOSIS — I10 Essential (primary) hypertension: Secondary | ICD-10-CM | POA: Diagnosis not present

## 2018-08-19 DIAGNOSIS — E559 Vitamin D deficiency, unspecified: Secondary | ICD-10-CM

## 2018-08-19 DIAGNOSIS — I6523 Occlusion and stenosis of bilateral carotid arteries: Secondary | ICD-10-CM

## 2018-08-19 DIAGNOSIS — E875 Hyperkalemia: Secondary | ICD-10-CM

## 2018-08-19 DIAGNOSIS — G629 Polyneuropathy, unspecified: Secondary | ICD-10-CM | POA: Diagnosis not present

## 2018-08-19 DIAGNOSIS — E7800 Pure hypercholesterolemia, unspecified: Secondary | ICD-10-CM

## 2018-08-19 LAB — BASIC METABOLIC PANEL
BUN: 33 mg/dL — ABNORMAL HIGH (ref 6–23)
CHLORIDE: 106 meq/L (ref 96–112)
CO2: 24 meq/L (ref 19–32)
Calcium: 8.9 mg/dL (ref 8.4–10.5)
Creatinine, Ser: 1.23 mg/dL (ref 0.40–1.50)
GFR: 55.42 mL/min — ABNORMAL LOW (ref >60.00)
GLUCOSE: 100 mg/dL — AB (ref 70–99)
POTASSIUM: 5.2 meq/L — AB (ref 3.5–5.1)
SODIUM: 138 meq/L (ref 135–145)

## 2018-08-19 LAB — CBC
HCT: 42.6 % (ref 39.0–52.0)
HEMOGLOBIN: 13.6 g/dL (ref 13.0–17.0)
MCHC: 31.9 g/dL (ref 30.0–36.0)
MCV: 85.9 fl (ref 78.0–100.0)
PLATELETS: 282 10*3/uL (ref 150.0–400.0)
RBC: 4.96 Mil/uL (ref 4.22–5.81)
RDW: 14 % (ref 11.5–15.5)
WBC: 8.5 10*3/uL (ref 4.0–10.5)

## 2018-08-19 LAB — LDL CHOLESTEROL, DIRECT: Direct LDL: 95 mg/dL

## 2018-08-19 MED ORDER — GABAPENTIN 300 MG PO CAPS: 300.0000 mg | ORAL_CAPSULE | Freq: Three times a day (TID) | ORAL | 3 refills | Status: DC

## 2018-08-19 NOTE — Progress Notes (Signed)
Established Patient Office Visit  Subjective:  Patient ID: [name], male    DOB: August 27, 1927  Age: 85 y.o. MRN: [id number]  CC:  Chief Complaint  Patient presents with  . Follow-up    HPI [name] presents for follow-up of his blood pressure and chronic kidney disease that have been well controlled with the amlodipine and lisinopril.  He is tolerated the higher dose of Neurontin and feels as though it is helped the neuropathy in his legs and feet.  He still feels a little unsteady on his feet and has decided to use the cane on a permanent basis.  He continues to perform all of his activities of daily living.  He was able to drive himself over here.  He did go down to [place] to check on [employer] that he had done in the past.  Past Medical History:  Diagnosis Date  . Branch retinal vein occlusion of left eye   . High cholesterol   . Hypertension   . TIA (transient ischemic attack)     Past Surgical History:  Procedure Laterality Date  . CAROTID ENDARTERECTOMY Right   . HERNIA REPAIR    . INNER EAR SURGERY Right    for hearing loss    Family History  Problem Relation Age of Onset  . Lung cancer Father     Social History   Socioeconomic History  . Marital status: Widowed    Spouse name: Not on file  . Number of children: 4  . Years of education: Not on file  . 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 Financial resource strain: Not on file  . Food insecurity:    Worry: Not on file    Inability: Not on file  . Transportation needs:    Medical: Not on file    Non-medical: Not on file  Tobacco Use  . Smoking status: Never Smoker  . Smokeless tobacco: Never Used  Substance and Sexual Activity  . Alcohol use: Yes    Comment: 1 glass wine  . Drug use: Never  . Sexual activity: Not on file  Lifestyle  . Physical activity:    Days per  week: Not on file    Minutes per session: Not on file  . Stress: Not on file  Relationships  . Social connections:    Talks on phone: Not on file    Gets together: Not on file    Attends religious service: Not on file    Active member of club or organization: Not on file    Attends meetings of clubs or organizations: Not on file    Relationship status: Not on file  . Intimate partner violence:    Fear of current or ex partner: Not on file    Emotionally abused: Not on file    Physically abused: Not on file    Forced sexual activity: Not on file  Other Topics Concern  . Not on file  Social History Narrative   Lives alone    Outpatient Medications Prior to Visit  Medication Sig Dispense Refill  . acetaminophen (TYLENOL) 500 MG tablet Take 500 mg by mouth every 6 (six) hours as needed.    [name] amLODipine (NORVASC) 10 MG tablet Take 1 tablet (10 mg total) by mouth daily. 90 tablet 1  . Cholecalciferol (VITAMIN D3) 2000 units TABS Take 1 tablet by  mouth daily.    . clopidogrel (PLAVIX) 75 MG tablet TAKE 1 TABLET BY MOUTH ONCE DAILY 90 tablet 0  . lisinopril (PRINIVIL,ZESTRIL) 10 MG tablet TAKE 1 TABLET BY MOUTH ONCE DAILY 90 tablet 0  . simvastatin (ZOCOR) 5 MG tablet Take 1 tablet (5 mg total) by mouth at bedtime. 100 tablet 3  . vitamin B-12 (CYANOCOBALAMIN) 500 MCG tablet Take 500 mcg by mouth daily.    . vitamin C (ASCORBIC ACID) 500 MG tablet Take 500 mg by mouth daily.    [name] gabapentin (NEURONTIN) 300 MG capsule TAKE 1 CAPSULE BY MOUTH THREE TIMES DAILY 90 capsule 3   No facility-administered medications prior to visit.     Allergies  Allergen Reactions  . Codeine Other (See Comments)    No reaction noted  . Penicillins     REACTION: rash    ROS Review of Systems  Constitutional: Negative for diaphoresis, fatigue, fever and unexpected weight change.  Eyes: Negative for photophobia and visual disturbance.  Respiratory: Negative.   Cardiovascular: Negative.     Gastrointestinal: Negative.   Endocrine: Negative for polyphagia and polyuria.  Genitourinary: Negative.   Musculoskeletal: Positive for gait problem. Negative for joint swelling.  Neurological: Positive for light-headedness and numbness. Negative for weakness.  Hematological: Does not bruise/bleed easily.  Psychiatric/Behavioral: Negative.       Objective:    Physical Exam  Constitutional: He is oriented to person, place, and time. He appears well-developed and well-nourished. No distress.  HENT:  Head: Normocephalic and atraumatic.  Right Ear: External ear normal.  Left Ear: External ear normal.  Mouth/Throat: Oropharynx is clear and moist. No oropharyngeal exudate.  Eyes: Pupils are equal, round, and reactive to light. Conjunctivae are normal. Right eye exhibits no discharge. Left eye exhibits no discharge. No scleral icterus.  Neck: Neck supple. No JVD present. No tracheal deviation present. No thyromegaly present.  Cardiovascular: Normal rate, regular rhythm and normal heart sounds.  Pulmonary/Chest: Effort normal and breath sounds normal. No stridor.  Abdominal: Bowel sounds are normal.  Musculoskeletal:        General: Edema present.  Lymphadenopathy:    He has no cervical adenopathy.  Neurological: He is alert and oriented to person, place, and time.  Skin: Skin is warm and dry. He is not diaphoretic.  Psychiatric: He has a normal mood and affect. His behavior is normal.    BP 136/70   Pulse 63   Ht 5\' 10"  (1.778 m)   Wt 195 lb 6 oz (88.6 kg)   SpO2 95%   BMI 28.03 kg/m  Wt Readings from Last 3 Encounters:  08/19/18 195 lb 6 oz (88.6 kg)  07/07/18 193 lb (87.5 kg)  05/19/18 190 lb 8 oz (86.4 kg)   BP Readings from Last 3 Encounters:  08/19/18 136/70  07/07/18 (!) 133/59  05/19/18 120/70   Guideline developer:  UpToDate (see UpToDate for funding source) Date Released: June 2014  Health Maintenance Due  Topic Date Due  . [name]  10/09/1946     There are no preventive care reminders to display for this patient.  Lab Results  Component Value Date   TSH 2.650 04/04/2018   Lab Results  Component Value Date   WBC 7.1 12/10/2017   HGB 13.2 12/10/2017   HCT 41.1 12/10/2017   MCV 85.4 12/10/2017   PLT 255.0 12/10/2017   Lab Results  Component Value Date   NA 137 05/19/2018   K 4.9 05/19/2018   CO2 23 05/19/2018  GLUCOSE 108 (H) 05/19/2018   BUN 26 (H) 05/19/2018   CREATININE 1.26 05/19/2018   BILITOT 0.5 12/10/2017   ALKPHOS 120 (H) 12/10/2017   AST 11 12/10/2017   ALT 9 12/10/2017   PROT 6.8 01/14/2018   ALBUMIN 3.8 12/10/2017   CALCIUM 9.0 05/19/2018   GFR 57.33 (L) 05/19/2018   Lab Results  Component Value Date   CHOL 134 12/10/2017   Lab Results  Component Value Date   HDL 36.00 (L) 12/10/2017   Lab Results  Component Value Date   LDLCALC 75 12/10/2017   Lab Results  Component Value Date   TRIG 114.0 12/10/2017   Lab Results  Component Value Date   CHOLHDL 4 12/10/2017   No results found for: HGBA1C    Assessment & Plan:   Problem List Items Addressed This Visit      Cardiovascular and Mediastinum   Essential hypertension - Primary   Relevant Orders   CBC   Carotid disease, bilateral (HCC)     Nervous and Auditory   Neuropathy   Relevant Medications   gabapentin (NEURONTIN) 300 MG capsule   Other Relevant Orders   Basic metabolic panel     Genitourinary   CKD (chronic kidney disease) stage 3, GFR 30-59 ml/min (HCC)   Relevant Orders   Basic metabolic panel     Other   Hyperkalemia   Relevant Orders   Basic metabolic panel   Elevated cholesterol   Relevant Orders   LDL cholesterol, direct    Other Visit Diagnoses    Vitamin D deficiency       Relevant Orders   VITAMIN D 25 Hydroxy (Vit-D Deficiency, Fractures)      Meds ordered this encounter  Medications  . gabapentin (NEURONTIN) 300 MG capsule    Sig: Take 1 capsule (300 mg total) by mouth 3 (three) times  daily.    Dispense:  90 capsule    Refill:  3    Please consider 90 day supplies to promote better adherence    Follow-up: Return in about 3 months (around 11/18/2018).   Patient will try acupuncture for his neuropathy.  He says that they wanted him to take herbal supplements as well.  He and I both are reluctant for him to take those with his current medical regimen.  Encouraged him to stay active as tolerated and to follow-up in 3 months.

## 2018-08-20 ENCOUNTER — Other Ambulatory Visit: Payer: Self-pay | Admitting: Family Medicine

## 2018-08-20 LAB — VITAMIN D 25 HYDROXY (VIT D DEFICIENCY, FRACTURES): VITD: 33.13 ng/mL (ref 30.00–100.00)

## 2018-10-12 ENCOUNTER — Other Ambulatory Visit: Payer: Self-pay | Admitting: Family Medicine

## 2018-11-13 ENCOUNTER — Other Ambulatory Visit: Payer: Self-pay | Admitting: Family Medicine

## 2018-11-18 ENCOUNTER — Ambulatory Visit (INDEPENDENT_AMBULATORY_CARE_PROVIDER_SITE_OTHER): Payer: Medicare HMO | Admitting: Family Medicine

## 2018-11-18 ENCOUNTER — Encounter: Payer: Self-pay | Admitting: Family Medicine

## 2018-11-18 VITALS — Ht 70.0 in

## 2018-11-18 DIAGNOSIS — N183 Chronic kidney disease, stage 3 unspecified: Secondary | ICD-10-CM

## 2018-11-18 DIAGNOSIS — I10 Essential (primary) hypertension: Secondary | ICD-10-CM | POA: Diagnosis not present

## 2018-11-18 NOTE — Progress Notes (Signed)
Established Patient Office Visit  Subjective:  Patient ID: [name], male    DOB: May 11, 1928  Age: 85 y.o. MRN: [id number]  CC:  Chief Complaint  Patient presents with  . Follow-up    HPI [name] presents for follow-up of his hypertension and chronic Phillips disease.  Continues to take the amlodipine and lisinopril.  Tells me that his blood pressures have been running in the 1 50-1 60 range over low 60s.  His cuff is 78 to 83 years old.  He is feeling well.  Denies headache or blurred vision.  His neuropathy has been helped a great deal by the gabapentin.  Past Medical History:  Diagnosis Date  . Branch retinal vein occlusion of left eye   . High cholesterol   . Hypertension   . TIA (transient ischemic attack)     Past Surgical History:  Procedure Laterality Date  . CAROTID ENDARTERECTOMY Right   . HERNIA REPAIR    . INNER EAR SURGERY Right    for hearing loss    Family History  Problem Relation Age of Onset  . Lung cancer Father     Social History   Socioeconomic History  . Marital status: Widowed    Spouse name: Not on file  . Number of children: 4  . Years of education: Not on file  . 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 Financial resource strain: Not on file  . Food insecurity:    Worry: Not on file    Inability: Not on file  . Transportation needs:    Medical: Not on file    Non-medical: Not on file  Tobacco Use  . Smoking status: Never Smoker  . Smokeless tobacco: Never Used  Substance and Sexual Activity  . Alcohol use: Yes    Comment: 1 glass wine  . Drug use: Never  . Sexual activity: Not on file  Lifestyle  . Physical activity:    Days per week: Not on file    Minutes per session: Not on file  . Stress: Not on file  Relationships  . Social connections:    Talks on phone: Not on file    Gets together: Not on file    Attends religious  service: Not on file    Active member of club or organization: Not on file    Attends meetings of clubs or organizations: Not on file    Relationship status: Not on file  . Intimate partner violence:    Fear of current or ex partner: Not on file    Emotionally abused: Not on file    Physically abused: Not on file    Forced sexual activity: Not on file  Other Topics Concern  . Not on file  Social History Narrative   Lives alone    Outpatient Medications Prior to Visit  Medication Sig Dispense Refill  . acetaminophen (TYLENOL) 500 MG tablet Take 500 mg by mouth every 6 (six) hours as needed.    [name] amLODipine (NORVASC) 10 MG tablet TAKE 1 TABLET BY MOUTH ONCE DAILY 90 tablet 1  . Cholecalciferol (VITAMIN D3) 2000 units TABS Take 1 tablet by mouth daily.    . clopidogrel (PLAVIX) 75 MG tablet TAKE 1 TABLET BY MOUTH ONCE DAILY 90 tablet 0  . gabapentin (NEURONTIN) 300 MG capsule Take 1 capsule (300 mg total) by mouth 3 (three) times daily.  90 capsule 3  . lisinopril (PRINIVIL,ZESTRIL) 10 MG tablet Take 1 tablet by mouth once daily 90 tablet 0  . simvastatin (ZOCOR) 5 MG tablet Take 1 tablet (5 mg total) by mouth at bedtime. 100 tablet 3  . vitamin B-12 (CYANOCOBALAMIN) 500 MCG tablet Take 500 mcg by mouth daily.    . vitamin C (ASCORBIC ACID) 500 MG tablet Take 500 mg by mouth daily.     No facility-administered medications prior to visit.     Allergies  Allergen Reactions  . Codeine Other (See Comments)    No reaction noted  . Penicillins     REACTION: rash    ROS Review of Systems  Constitutional: Negative for diaphoresis, fatigue, fever and unexpected weight change.  Eyes: Negative for visual disturbance.  Respiratory: Negative.   Cardiovascular: Negative.   Gastrointestinal: Negative.   Neurological: Negative for dizziness and headaches.  Psychiatric/Behavioral: Negative.       Objective:    Physical Exam  Constitutional: He is oriented to person, place, and time.  He appears well-developed and well-nourished. No distress.  HENT:  Head: Normocephalic and atraumatic.  Right Ear: External ear normal.  Left Ear: External ear normal.  Eyes: Right eye exhibits no discharge. Left eye exhibits no discharge. No scleral icterus.  Neck: No JVD present. No tracheal deviation present.  Pulmonary/Chest: Effort normal. No stridor.  Neurological: He is alert and oriented to person, place, and time.  Skin: Skin is warm and dry. He is not diaphoretic.  Psychiatric: He has a normal mood and affect. His behavior is normal.    Ht 5\' 10"  (1.778 m)   BMI 28.03 kg/m  Wt Readings from Last 3 Encounters:  08/19/18 195 lb 6 oz (88.6 kg)  07/07/18 193 lb (87.5 kg)  05/19/18 190 lb 8 oz (86.4 kg)     Health Maintenance Due  Topic Date Due  . TETANUS/TDAP  10/09/1948    There are no preventive care reminders to display for this patient.  Lab Results  Component Value Date   TSH 2.650 04/04/2018   Lab Results  Component Value Date   WBC 8.5 08/19/2018   HGB 13.6 08/19/2018   HCT 42.6 08/19/2018   MCV 85.9 08/19/2018   PLT 282.0 08/19/2018   Lab Results  Component Value Date   NA 138 08/19/2018   K 5.2 (H) 08/19/2018   CO2 24 08/19/2018   GLUCOSE 100 (H) 08/19/2018   BUN 33 (H) 08/19/2018   CREATININE 1.23 08/19/2018   BILITOT 0.5 12/10/2017   ALKPHOS 120 (H) 12/10/2017   AST 11 12/10/2017   ALT 9 12/10/2017   PROT 6.8 01/14/2018   ALBUMIN 3.8 12/10/2017   CALCIUM 8.9 08/19/2018   GFR 55.42 (L) 08/19/2018   Lab Results  Component Value Date   CHOL 134 12/10/2017   Lab Results  Component Value Date   HDL 36.00 (L) 12/10/2017   Lab Results  Component Value Date   LDLCALC 75 12/10/2017   Lab Results  Component Value Date   TRIG 114.0 12/10/2017   Lab Results  Component Value Date   CHOLHDL 4 12/10/2017   No results found for: HGBA1C    Assessment & Plan:   Problem List Items Addressed This Visit      Cardiovascular and  Mediastinum   Essential hypertension - Primary   Relevant Orders   Basic metabolic panel     Genitourinary   CKD (chronic Phillips disease) stage 3, GFR 30-59 ml/min (HCC)  Relevant Orders   Basic metabolic panel      No orders of the defined types were placed in this encounter.   Follow-up: Return in about 1 month (around 12/18/2018).    Libby Maw, MDVirtual Visit via Video Note  I connected with [name] on 11/18/18 at  9:00 AM EDT by a video enabled telemedicine application and verified that I am speaking with the correct person using two identifiers.   I discussed the limitations of evaluation and management by telemedicine and the availability of in person appointments. The patient expressed understanding and agreed to proceed.  History of Present Illness:    Observations/Objective:   Assessment and Plan:   Follow Up Instructions:    I discussed the assessment and treatment plan with the patient. The patient was provided an opportunity to ask questions and all were answered. The patient agreed with the plan and demonstrated an understanding of the instructions.   The patient was advised to call back or seek an in-person evaluation if the symptoms worsen or if the condition fails to improve as anticipated.  I provided 15 minutes of non-face-to-face time during this encounter.  Patient will purchase a new blood pressure cuff and check and record his blood pressures and follow-up with me in a month.  Will make a lab appointment and drop by for BMP prior to see at that time.

## 2018-11-20 ENCOUNTER — Telehealth: Payer: Self-pay | Admitting: Cardiology

## 2018-11-20 NOTE — Telephone Encounter (Signed)
Called patient to schedule Recall by Televisit or Video visit and patient declined and is following up with PCP this week.  He states that if his PCP needs him to follow up with Cardiologist he will call back to schedule. LBW

## 2018-11-27 ENCOUNTER — Other Ambulatory Visit (INDEPENDENT_AMBULATORY_CARE_PROVIDER_SITE_OTHER): Payer: Medicare HMO

## 2018-11-27 DIAGNOSIS — I10 Essential (primary) hypertension: Secondary | ICD-10-CM

## 2018-11-27 LAB — BASIC METABOLIC PANEL
BUN: 33 mg/dL — ABNORMAL HIGH (ref 6–23)
CO2: 21 mEq/L (ref 19–32)
Calcium: 8.7 mg/dL (ref 8.4–10.5)
Chloride: 108 mEq/L (ref 96–112)
Creatinine, Ser: 1.43 mg/dL (ref 0.40–1.50)
GFR: 46.55 mL/min — ABNORMAL LOW (ref >60.00)
Glucose, Bld: 97 mg/dL (ref 70–99)
Potassium: 4.7 mEq/L (ref 3.5–5.1)
Sodium: 138 mEq/L (ref 135–145)

## 2018-11-27 NOTE — Progress Notes (Signed)
Patient and lab staff wore face masks for visit per COVID-19 protocol.

## 2018-12-16 DIAGNOSIS — Z809 Family history of malignant neoplasm, unspecified: Secondary | ICD-10-CM | POA: Diagnosis not present

## 2018-12-16 DIAGNOSIS — E785 Hyperlipidemia, unspecified: Secondary | ICD-10-CM | POA: Diagnosis not present

## 2018-12-16 DIAGNOSIS — Z7902 Long term (current) use of antithrombotics/antiplatelets: Secondary | ICD-10-CM | POA: Diagnosis not present

## 2018-12-16 DIAGNOSIS — Z8673 Personal history of transient ischemic attack (TIA), and cerebral infarction without residual deficits: Secondary | ICD-10-CM | POA: Diagnosis not present

## 2018-12-16 DIAGNOSIS — I10 Essential (primary) hypertension: Secondary | ICD-10-CM | POA: Diagnosis not present

## 2018-12-16 DIAGNOSIS — G8929 Other chronic pain: Secondary | ICD-10-CM | POA: Diagnosis not present

## 2018-12-16 DIAGNOSIS — R69 Illness, unspecified: Secondary | ICD-10-CM | POA: Diagnosis not present

## 2018-12-16 DIAGNOSIS — Z880 Allergy status to penicillin: Secondary | ICD-10-CM | POA: Diagnosis not present

## 2018-12-16 DIAGNOSIS — J309 Allergic rhinitis, unspecified: Secondary | ICD-10-CM | POA: Diagnosis not present

## 2018-12-16 DIAGNOSIS — G629 Polyneuropathy, unspecified: Secondary | ICD-10-CM | POA: Diagnosis not present

## 2019-01-01 ENCOUNTER — Ambulatory Visit (INDEPENDENT_AMBULATORY_CARE_PROVIDER_SITE_OTHER): Payer: Medicare HMO | Admitting: Family Medicine

## 2019-01-01 ENCOUNTER — Encounter: Payer: Self-pay | Admitting: Family Medicine

## 2019-01-01 VITALS — BP 150/64 | HR 52 | Ht 70.0 in | Wt 201.0 lb

## 2019-01-01 DIAGNOSIS — E7800 Pure hypercholesterolemia, unspecified: Secondary | ICD-10-CM

## 2019-01-01 DIAGNOSIS — N183 Chronic kidney disease, stage 3 unspecified: Secondary | ICD-10-CM

## 2019-01-01 DIAGNOSIS — I10 Essential (primary) hypertension: Secondary | ICD-10-CM | POA: Diagnosis not present

## 2019-01-01 DIAGNOSIS — R635 Abnormal weight gain: Secondary | ICD-10-CM | POA: Insufficient documentation

## 2019-01-01 DIAGNOSIS — I6523 Occlusion and stenosis of bilateral carotid arteries: Secondary | ICD-10-CM | POA: Diagnosis not present

## 2019-01-01 DIAGNOSIS — R001 Bradycardia, unspecified: Secondary | ICD-10-CM | POA: Diagnosis not present

## 2019-01-01 MED ORDER — LISINOPRIL 20 MG PO TABS: 20.0000 mg | ORAL_TABLET | Freq: Every day | ORAL | 1 refills | Status: DC

## 2019-01-01 MED ORDER — SIMVASTATIN 10 MG PO TABS: 10.0000 mg | ORAL_TABLET | Freq: Every day | ORAL | 3 refills | Status: DC

## 2019-01-01 NOTE — Progress Notes (Signed)
Established Patient Office Visit  Subjective:  Patient ID: [name], male    DOB: Oct 12, 1927  Age: 85 y.o. MRN: [id number]  CC:  Chief Complaint  Patient presents with  . Hypertension    HPI [name] presents for follow-up of his blood pressure.  Blood pressures been running in the upper 150s over the 60-80 range.  Remains compliant with his 10 mg of Norvasc and 10 mg of lisinopril.  Denies issues with these medications but he is having some lower extremity edema that resolves overnight.  Denies shortness of breath chest pain or dyspnea on exertion.  Sleeps on one pillow.  Thiazide diuretics have affected his GFR in the past.  He is on 5 mg of Zocor his last LDL cholesterol was 95.  He has a history of cough carotid artery disease and TIAs.  He has no issues taking the Zocor.  He is complained of some fatigue but admits to increased weight and decreased exercise secondary to concerns of going outside with Covid and his neuropathy.   Past Medical History:  Diagnosis Date  . Branch retinal vein occlusion of left eye   . High cholesterol   . Hypertension   . TIA (transient ischemic attack)     Past Surgical History:  Procedure Laterality Date  . CAROTID ENDARTERECTOMY Right   . HERNIA REPAIR    . INNER EAR SURGERY Right    for hearing loss    Family History  Problem Relation Age of Onset  . Lung cancer Father     Social History   Socioeconomic History  . Marital status: Widowed    Spouse name: Not on file  . Number of children: 4  . Years of education: Not on file  . 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 Financial resource strain: Not on file  . Food insecurity:    Worry: Not on file    Inability: Not on file  . Transportation needs:    Medical: Not on file    Non-medical: Not on file  Tobacco Use  . Smoking status: Never Smoker  . Smokeless tobacco: Never Used   Substance and Sexual Activity  . Alcohol use: Yes    Comment: 1 glass wine  . Drug use: Never  . Sexual activity: Not on file  Lifestyle  . Physical activity:    Days per week: Not on file    Minutes per session: Not on file  . Stress: Not on file  Relationships  . Social connections:    Talks on phone: Not on file    Gets together: Not on file    Attends religious service: Not on file    Active member of club or organization: Not on file    Attends meetings of clubs or organizations: Not on file    Relationship status: Not on file  . Intimate partner violence:    Fear of current or ex partner: Not on file    Emotionally abused: Not on file    Physically abused: Not on file    Forced sexual activity: Not on file  Other Topics Concern  . Not on file  Social History Narrative   Lives alone    Outpatient Medications Prior to Visit  Medication Sig Dispense Refill  . acetaminophen (TYLENOL) 500 MG tablet Take 500 mg by mouth every 6 (six) hours as needed.    [name]  amLODipine (NORVASC) 10 MG tablet TAKE 1 TABLET BY MOUTH ONCE DAILY 90 tablet 1  . Cholecalciferol (VITAMIN D3) 2000 units TABS Take 1 tablet by mouth daily.    . clopidogrel (PLAVIX) 75 MG tablet TAKE 1 TABLET BY MOUTH ONCE DAILY 90 tablet 0  . gabapentin (NEURONTIN) 300 MG capsule Take 1 capsule (300 mg total) by mouth 3 (three) times daily. 90 capsule 3  . vitamin B-12 (CYANOCOBALAMIN) 500 MCG tablet Take 500 mcg by mouth daily.    . vitamin C (ASCORBIC ACID) 500 MG tablet Take 500 mg by mouth daily.    [name] lisinopril (PRINIVIL,ZESTRIL) 10 MG tablet Take 1 tablet by mouth once daily 90 tablet 0  . simvastatin (ZOCOR) 5 MG tablet Take 1 tablet (5 mg total) by mouth at bedtime. 100 tablet 3   No facility-administered medications prior to visit.     Allergies  Allergen Reactions  . Codeine Other (See Comments)    No reaction noted  . Penicillins     REACTION: rash    ROS Review of Systems  Constitutional:  Positive for fatigue. Negative for chills, diaphoresis, fever and unexpected weight change.  HENT: Negative.   Eyes: Negative for photophobia and visual disturbance.  Respiratory: Negative for chest tightness, shortness of breath and wheezing.   Cardiovascular: Negative for chest pain and palpitations.  Gastrointestinal: Negative.   Endocrine: Negative for polyphagia and polyuria.  Genitourinary: Negative for difficulty urinating.  Musculoskeletal: Negative for joint swelling and myalgias.  Allergic/Immunologic: Negative for immunocompromised state.  Neurological: Positive for numbness. Negative for headaches.  Hematological: Does not bruise/bleed easily.  Psychiatric/Behavioral: Negative.       Objective:    Physical Exam  Constitutional: He appears well-developed and well-nourished. No distress.  HENT:  Head: Normocephalic and atraumatic.  Right Ear: External ear normal.  Left Ear: External ear normal.  Eyes: Right eye exhibits no discharge. Left eye exhibits no discharge. No scleral icterus.  Neck: No JVD present. No tracheal deviation present.  Cardiovascular: Normal rate, regular rhythm and normal heart sounds.  Occasional extrasystoles are present.  Pulmonary/Chest: Effort normal and breath sounds normal. No stridor.  Musculoskeletal:        General: Edema (trace to 1 plus) present.  Skin: Skin is warm and dry. He is not diaphoretic.  Psychiatric: He has a normal mood and affect. His behavior is normal.    BP (!) 150/64   Pulse (!) 52   Ht 5\' 10"  (1.778 m)   Wt 201 lb (91.2 kg)   SpO2 94%   BMI 28.84 kg/m  Wt Readings from Last 3 Encounters:  01/01/19 201 lb (91.2 kg)  08/19/18 195 lb 6 oz (88.6 kg)  07/07/18 193 lb (87.5 kg)   BP Readings from Last 3 Encounters:  01/01/19 (!) 150/64  08/19/18 136/70  07/07/18 (!) 133/59   Guideline developer:  UpToDate (see UpToDate for funding source) Date Released: June 2014  Health Maintenance Due  Topic Date Due  .  [name]  10/09/1946    There are no preventive care reminders to display for this patient.  Lab Results  Component Value Date   TSH 2.650 04/04/2018   Lab Results  Component Value Date   WBC 8.5 08/19/2018   HGB 13.6 08/19/2018   HCT 42.6 08/19/2018   MCV 85.9 08/19/2018   PLT 282.0 08/19/2018   Lab Results  Component Value Date   NA 138 11/27/2018   K 4.7 11/27/2018   CO2 21 11/27/2018  GLUCOSE 97 11/27/2018   BUN 33 (H) 11/27/2018   CREATININE 1.43 11/27/2018   BILITOT 0.5 12/10/2017   ALKPHOS 120 (H) 12/10/2017   AST 11 12/10/2017   ALT 9 12/10/2017   PROT 6.8 01/14/2018   ALBUMIN 3.8 12/10/2017   CALCIUM 8.7 11/27/2018   GFR 46.55 (L) 11/27/2018   Lab Results  Component Value Date   CHOL 134 12/10/2017   Lab Results  Component Value Date   HDL 36.00 (L) 12/10/2017   Lab Results  Component Value Date   LDLCALC 75 12/10/2017   Lab Results  Component Value Date   TRIG 114.0 12/10/2017   Lab Results  Component Value Date   CHOLHDL 4 12/10/2017   No results found for: HGBA1C    Assessment & Plan:   Problem List Items Addressed This Visit      Cardiovascular and Mediastinum   Essential hypertension - Primary   Relevant Medications   lisinopril (ZESTRIL) 20 MG tablet   simvastatin (ZOCOR) 10 MG tablet   Other Relevant Orders   Basic metabolic panel   Carotid disease, bilateral (HCC)   Relevant Medications   lisinopril (ZESTRIL) 20 MG tablet   simvastatin (ZOCOR) 10 MG tablet     Genitourinary   CKD (chronic kidney disease) stage 3, GFR 30-59 ml/min (HCC)   Relevant Orders   Basic metabolic panel     Other   Bradycardia   Elevated cholesterol   Relevant Medications   lisinopril (ZESTRIL) 20 MG tablet   simvastatin (ZOCOR) 10 MG tablet   Weight gain      Meds ordered this encounter  Medications  . lisinopril (ZESTRIL) 20 MG tablet    Sig: Take 1 tablet (20 mg total) by mouth daily.    Dispense:  30 tablet    Refill:  1  .  simvastatin (ZOCOR) 10 MG tablet    Sig: Take 1 tablet (10 mg total) by mouth at bedtime.    Dispense:  90 tablet    Refill:  3    Follow-up: Return in about 2 weeks (around 01/15/2019).    I have increased lisinopril to 20 mg daily.  BMP today in 3 weeks.  Check and record blood pressures.  Increase Zocor to 10 mg daily.

## 2019-01-02 ENCOUNTER — Ambulatory Visit (INDEPENDENT_AMBULATORY_CARE_PROVIDER_SITE_OTHER): Payer: Medicare HMO | Admitting: Family Medicine

## 2019-01-02 ENCOUNTER — Encounter: Payer: Self-pay | Admitting: Family Medicine

## 2019-01-02 DIAGNOSIS — N183 Chronic kidney disease, stage 3 unspecified: Secondary | ICD-10-CM

## 2019-01-02 DIAGNOSIS — I10 Essential (primary) hypertension: Secondary | ICD-10-CM

## 2019-01-02 DIAGNOSIS — E875 Hyperkalemia: Secondary | ICD-10-CM | POA: Diagnosis not present

## 2019-01-02 LAB — BASIC METABOLIC PANEL
BUN/Creatinine Ratio: 20 (calc) (ref 6–22)
BUN: 31 mg/dL — ABNORMAL HIGH (ref 7–25)
CO2: 19 mmol/L — ABNORMAL LOW (ref 20–32)
Calcium: 9.3 mg/dL (ref 8.6–10.3)
Chloride: 106 mmol/L (ref 98–110)
Creat: 1.57 mg/dL — ABNORMAL HIGH (ref 0.70–1.11)
Glucose, Bld: 84 mg/dL (ref 65–99)
Potassium: 6.3 mmol/L (ref 3.5–5.3)
Sodium: 134 mmol/L — ABNORMAL LOW (ref 135–146)

## 2019-01-02 MED ORDER — CHLORTHALIDONE 25 MG PO TABS: 25.0000 mg | ORAL_TABLET | Freq: Every day | ORAL | 0 refills | Status: DC

## 2019-01-02 MED ORDER — PATIROMER SORBITEX CALCIUM 8.4 G PO PACK: 8.4000 g | PACK | Freq: Every day | ORAL | 0 refills | Status: DC

## 2019-01-02 NOTE — Progress Notes (Addendum)
Established Patient Office Visit  Subjective:  Patient ID: [name], male    DOB: [birth year]  Age: 85 y.o. MRN: [id number]  CC:  Chief Complaint  Patient presents with  . Follow-up    HPI [name] presents for follow-up of his blood pressure and hyperkalemia.  Patient has taken 1 dose of his 20 mg lisinopril.  Reminds me that there was a bump in his BUN and creatinine when he was last started on HCTZ.  Past Medical History:  Diagnosis Date  . Branch retinal vein occlusion of left eye   . High cholesterol   . Hypertension   . TIA (transient ischemic attack)     Past Surgical History:  Procedure Laterality Date  . CAROTID ENDARTERECTOMY Right   . HERNIA REPAIR    . INNER EAR SURGERY Right    for hearing loss    Family History  Problem Relation Age of Onset  . Lung cancer Father     Social History   Socioeconomic History  . Marital status: Widowed    Spouse name: Not on file  . Number of children: 4  . Years of education: Not on file  . 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 Financial resource strain: Not on file  . Food insecurity:    Worry: Not on file    Inability: Not on file  . Transportation needs:    Medical: Not on file    Non-medical: Not on file  Tobacco Use  . Smoking status: Never Smoker  . Smokeless tobacco: Never Used  Substance and Sexual Activity  . Alcohol use: Yes    Comment: 1 glass wine  . Drug use: Never  . Sexual activity: Not on file  Lifestyle  . Physical activity:    Days per week: Not on file    Minutes per session: Not on file  . Stress: Not on file  Relationships  . Social connections:    Talks on phone: Not on file    Gets together: Not on file    Attends religious service: Not on file    Active member of club or organization: Not on file    Attends meetings of clubs or organizations: Not on file    Relationship  status: Not on file  . Intimate partner violence:    Fear of current or ex partner: Not on file    Emotionally abused: Not on file    Physically abused: Not on file    Forced sexual activity: Not on file  Other Topics Concern  . Not on file  Social History Narrative   Lives alone    Outpatient Medications Prior to Visit  Medication Sig Dispense Refill  . acetaminophen (TYLENOL) 500 MG tablet Take 500 mg by mouth every 6 (six) hours as needed.    [name] amLODipine (NORVASC) 10 MG tablet TAKE 1 TABLET BY MOUTH ONCE DAILY 90 tablet 1  . Cholecalciferol (VITAMIN D3) 2000 units TABS Take 1 tablet by mouth daily.    . clopidogrel (PLAVIX) 75 MG tablet TAKE 1 TABLET BY MOUTH ONCE DAILY 90 tablet 0  . gabapentin (NEURONTIN) 300 MG capsule Take 1 capsule (300 mg total) by mouth 3 (three) times daily. 90 capsule 3  . simvastatin (ZOCOR) 10 MG tablet Take 1 tablet (10 mg total) by mouth at bedtime. 90 tablet 3  . vitamin B-12 (CYANOCOBALAMIN) 500  MCG tablet Take 500 mcg by mouth daily.    . vitamin C (ASCORBIC ACID) 500 MG tablet Take 500 mg by mouth daily.    [name] lisinopril (ZESTRIL) 20 MG tablet Take 1 tablet (20 mg total) by mouth daily. 30 tablet 1   No facility-administered medications prior to visit.     Allergies  Allergen Reactions  . Codeine Other (See Comments)    No reaction noted  . Penicillins     REACTION: rash    ROS Review of Systems  Constitutional: Negative.   Respiratory: Negative for chest tightness, shortness of breath and wheezing.   Cardiovascular: Negative for chest pain and palpitations.  Gastrointestinal: Negative.       Objective:    Physical Exam  Constitutional: He is oriented to person, place, and time. No distress.  Pulmonary/Chest: Effort normal.  Neurological: He is alert and oriented to person, place, and time.  Psychiatric: He has a normal mood and affect. His behavior is normal.    There were no vitals taken for this visit. Wt Readings from Last  3 Encounters:  01/01/19 201 lb (91.2 kg)  08/19/18 195 lb 6 oz (88.6 kg)  07/07/18 193 lb (87.5 kg)   BP Readings from Last 3 Encounters:  01/01/19 (!) 150/64  08/19/18 136/70  07/07/18 (!) 133/59   Guideline developer:  UpToDate (see UpToDate for funding source) Date Released: June 2014  Health Maintenance Due  Topic Date Due  . [name]  10/09/1946    There are no preventive care reminders to display for this patient.  Lab Results  Component Value Date   TSH 2.650 04/04/2018   Lab Results  Component Value Date   WBC 8.5 08/19/2018   HGB 13.6 08/19/2018   HCT 42.6 08/19/2018   MCV 85.9 08/19/2018   PLT 282.0 08/19/2018   Lab Results  Component Value Date   NA 134 (L) 01/01/2019   K 6.3 (HH) 01/01/2019   CO2 19 (L) 01/01/2019   GLUCOSE 84 01/01/2019   BUN 31 (H) 01/01/2019   CREATININE 1.57 (H) 01/01/2019   BILITOT 0.5 12/10/2017   ALKPHOS 120 (H) 12/10/2017   AST 11 12/10/2017   ALT 9 12/10/2017   PROT 6.8 01/14/2018   ALBUMIN 3.8 12/10/2017   CALCIUM 9.3 01/01/2019   GFR 46.55 (L) 11/27/2018   Lab Results  Component Value Date   CHOL 134 12/10/2017   Lab Results  Component Value Date   HDL 36.00 (L) 12/10/2017   Lab Results  Component Value Date   LDLCALC 75 12/10/2017   Lab Results  Component Value Date   TRIG 114.0 12/10/2017   Lab Results  Component Value Date   CHOLHDL 4 12/10/2017   No results found for: HGBA1C    Assessment & Plan:   Problem List Items Addressed This Visit      Cardiovascular and Mediastinum   Essential hypertension - Primary   Relevant Medications   chlorthalidone (HYGROTON) 25 MG tablet     Genitourinary   CKD (chronic kidney disease) stage 3, GFR 30-59 ml/min (HCC)     Other   Hyperkalemia   Relevant Medications   sodium polystyrene (KAYEXALATE) 15 GM/60ML suspension      Meds ordered this encounter  Medications  . chlorthalidone (HYGROTON) 25 MG tablet    Sig: Take 1 tablet (25 mg total) by  mouth daily for 30 days.    Dispense:  30 tablet    Refill:  0  . DISCONTD: patiromer (  VELTASSA) 8.4 g packet    Sig: Take 1 packet (8.4 g total) by mouth daily.    Dispense:  30 packet    Refill:  0  . DISCONTD: sodium polystyrene (KAYEXALATE) 15 GM/60ML suspension    Sig: Take 60 mLs (15 g total) by mouth 2 (two) times a day for 5 days.    Dispense:  240 mL    Refill:  0  . sodium polystyrene (KAYEXALATE) 15 GM/60ML suspension    Sig: Take 60 mLs (15 g total) by mouth daily for 6 days.    Dispense:  360 mL    Refill:  0    Follow-up: Return in about 1 week (around 01/09/2019).   Patient will discontinue the 20 mg of lisinopril.  He will restart lisinopril at 10 mg.  Have added chlorthalidone 25 mg daily.  Strongly encouraged hydration.  Have added Patiromer 8.4 mg daily. 6/9: Continue room air is needing prior off.  Zirconium is too expensive.  Will use Kayexalate 15 g twice daily for 5 days.  Patient will follow-up on Friday for recheck of his potassium.

## 2019-01-05 ENCOUNTER — Encounter: Payer: Self-pay | Admitting: Family Medicine

## 2019-01-05 DIAGNOSIS — E875 Hyperkalemia: Secondary | ICD-10-CM

## 2019-01-06 ENCOUNTER — Encounter: Payer: Self-pay | Admitting: Family Medicine

## 2019-01-06 ENCOUNTER — Telehealth: Payer: Self-pay

## 2019-01-06 DIAGNOSIS — E875 Hyperkalemia: Secondary | ICD-10-CM

## 2019-01-06 MED ORDER — SODIUM ZIRCONIUM CYCLOSILICATE 5 G PO PACK: 10.0000 g | PACK | Freq: Every day | ORAL | 0 refills | Status: AC

## 2019-01-06 MED ORDER — SODIUM POLYSTYRENE SULFONATE 15 GM/60ML PO SUSP: 15.0000 g | Freq: Every day | ORAL | 0 refills | Status: AC

## 2019-01-06 MED ORDER — SODIUM POLYSTYRENE SULFONATE 15 GM/60ML PO SUSP: 15.0000 g | Freq: Two times a day (BID) | ORAL | 0 refills | Status: DC

## 2019-01-06 NOTE — Telephone Encounter (Signed)
Copied from [place] [id number] [phone]. Topic: General - Other >> Jan 06, 2019 10:47 AM Rainey Pines A wrote: Cecille Rubin from [facility] is asking if Dr. Ethelene Hal can send over a different drug prescription because the sodium zirconium cyclosilicate (LOKELMA) 5 g packet that was sent over is $255. Cecille Rubin would like a callback at [phone]

## 2019-01-06 NOTE — Addendum Note (Signed)
Addended by: Abelino Derrick A on: 01/06/2019 11:42 AM   Modules accepted: Orders

## 2019-01-06 NOTE — Addendum Note (Signed)
Addended by: Jon Billings on: 01/06/2019 12:46 PM   Modules accepted: Orders

## 2019-01-07 ENCOUNTER — Encounter: Payer: Self-pay | Admitting: Family Medicine

## 2019-01-07 ENCOUNTER — Telehealth: Payer: Self-pay | Admitting: Family Medicine

## 2019-01-07 NOTE — Telephone Encounter (Signed)
Admin please help

## 2019-01-07 NOTE — Telephone Encounter (Unsigned)
Copied from [place] [id number]. Topic: General - Other >> Jan 07, 2019 12:22 PM Oneta Rack wrote: Relation to pt: self  Call back number: [phone] Pharmacy: [place], [place] [phone] (Phone) [phone] (Fax)  Reason for call:  Patient would like to scheduled lab appointment for Friday. Patient would like a call back today from Dr. Ethelene Hal nurse in need of clarity regarding BP and cholesterol medication stating the MG is different, please advise

## 2019-01-08 ENCOUNTER — Telehealth: Payer: Self-pay | Admitting: Family Medicine

## 2019-01-08 NOTE — Telephone Encounter (Signed)

## 2019-01-09 ENCOUNTER — Other Ambulatory Visit (INDEPENDENT_AMBULATORY_CARE_PROVIDER_SITE_OTHER): Payer: Medicare HMO

## 2019-01-09 DIAGNOSIS — E875 Hyperkalemia: Secondary | ICD-10-CM

## 2019-01-09 LAB — BASIC METABOLIC PANEL
BUN: 24 mg/dL — ABNORMAL HIGH (ref 6–23)
CO2: 20 mEq/L (ref 19–32)
Calcium: 8.9 mg/dL (ref 8.4–10.5)
Chloride: 107 mEq/L (ref 96–112)
Creatinine, Ser: 1.33 mg/dL (ref 0.40–1.50)
GFR: 50.6 mL/min — ABNORMAL LOW (ref >60.00)
Glucose, Bld: 90 mg/dL (ref 70–99)
Potassium: 5 mEq/L (ref 3.5–5.1)
Sodium: 138 mEq/L (ref 135–145)

## 2019-01-09 LAB — CORTISOL: Cortisol, Plasma: 11.8 ug/dL

## 2019-01-10 ENCOUNTER — Other Ambulatory Visit: Payer: Self-pay | Admitting: Family Medicine

## 2019-01-12 ENCOUNTER — Telehealth: Payer: Self-pay

## 2019-01-12 NOTE — Telephone Encounter (Signed)

## 2019-01-13 ENCOUNTER — Encounter: Payer: Self-pay | Admitting: Family Medicine

## 2019-01-13 ENCOUNTER — Ambulatory Visit (INDEPENDENT_AMBULATORY_CARE_PROVIDER_SITE_OTHER): Payer: Medicare HMO | Admitting: Family Medicine

## 2019-01-13 VITALS — BP 148/60 | HR 63 | Ht 70.0 in | Wt 201.4 lb

## 2019-01-13 DIAGNOSIS — I10 Essential (primary) hypertension: Secondary | ICD-10-CM

## 2019-01-13 DIAGNOSIS — N183 Chronic kidney disease, stage 3 unspecified: Secondary | ICD-10-CM

## 2019-01-13 DIAGNOSIS — E875 Hyperkalemia: Secondary | ICD-10-CM | POA: Diagnosis not present

## 2019-01-13 MED ORDER — CLONIDINE HCL 0.1 MG PO TABS: 0.1000 mg | ORAL_TABLET | Freq: Two times a day (BID) | ORAL | 3 refills | Status: DC

## 2019-01-13 NOTE — Progress Notes (Signed)
Established Patient Office Visit  Subjective:  Patient ID: [name], male    DOB: Mar 27, 1928  Age: 85 y.o. MRN: [id number]  CC:  Chief Complaint  Patient presents with  . Follow-up    HPI [name] presents for follow-up of his hypertension, hyperkalemia and CKD.  Currently taking lisinopril 10, chlorthalidone 25 and Norvasc 10.  Tolerated Kayexalate and hyperkalemia responded.  Believe that lisinopril may be chiefly responsible for hyperkalemia.  Cortisol level is normal.  Past Medical History:  Diagnosis Date  . Branch retinal vein occlusion of left eye   . High cholesterol   . Hypertension   . TIA (transient ischemic attack)     Past Surgical History:  Procedure Laterality Date  . CAROTID ENDARTERECTOMY Right   . HERNIA REPAIR    . INNER EAR SURGERY Right    for hearing loss    Family History  Problem Relation Age of Onset  . Lung cancer Father     Social History   Socioeconomic History  . Marital status: Widowed    Spouse name: Not on file  . Number of children: 4  . Years of education: Not on file  . 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 Financial resource strain: Not on file  . Food insecurity    Worry: Not on file    Inability: Not on file  . Transportation needs    Medical: Not on file    Non-medical: Not on file  Tobacco Use  . Smoking status: Never Smoker  . Smokeless tobacco: Never Used  Substance and Sexual Activity  . Alcohol use: Yes    Comment: 1 glass wine  . Drug use: Never  . Sexual activity: Not on file  Lifestyle  . Physical activity    Days per week: Not on file    Minutes per session: Not on file  . Stress: Not on file  Relationships  . Social Herbalist on phone: Not on file    Gets together: Not on file    Attends religious service: Not on file    Active member of club or organization: Not on file    Attends  meetings of clubs or organizations: Not on file    Relationship status: Not on file  . Intimate partner violence    Fear of current or ex partner: Not on file    Emotionally abused: Not on file    Physically abused: Not on file    Forced sexual activity: Not on file  Other Topics Concern  . Not on file  Social History Narrative   Lives alone    Outpatient Medications Prior to Visit  Medication Sig Dispense Refill  . acetaminophen (TYLENOL) 500 MG tablet Take 500 mg by mouth every 6 (six) hours as needed.    [name] amLODipine (NORVASC) 10 MG tablet TAKE 1 TABLET BY MOUTH ONCE DAILY 90 tablet 1  . chlorthalidone (HYGROTON) 25 MG tablet Take 1 tablet (25 mg total) by mouth daily for 30 days. 30 tablet 0  . Cholecalciferol (VITAMIN D3) 2000 units TABS Take 1 tablet by mouth daily.    . clopidogrel (PLAVIX) 75 MG tablet Take 1 tablet by mouth once daily 90 tablet 1  . gabapentin (NEURONTIN) 300 MG capsule Take 1 capsule (300 mg total) by mouth 3 (three) times daily. 90 capsule 3  . simvastatin (ZOCOR)  10 MG tablet Take 1 tablet (10 mg total) by mouth at bedtime. 90 tablet 3  . vitamin B-12 (CYANOCOBALAMIN) 500 MCG tablet Take 500 mcg by mouth daily.    . vitamin C (ASCORBIC ACID) 500 MG tablet Take 500 mg by mouth daily.     No facility-administered medications prior to visit.     Allergies  Allergen Reactions  . Codeine Other (See Comments)    No reaction noted  . Penicillins     REACTION: rash    ROS Review of Systems  Constitutional: Negative.   Respiratory: Negative.   Cardiovascular: Negative.   Gastrointestinal: Negative.   Endocrine: Negative for polyphagia and polyuria.  Genitourinary: Negative.   Musculoskeletal: Negative for joint swelling and myalgias.  Skin: Negative for pallor and rash.  Neurological: Negative for light-headedness and headaches.  Hematological: Negative.   Psychiatric/Behavioral: Negative.       Objective:    Physical Exam  Constitutional: He  is oriented to person, place, and time. He appears well-developed and well-nourished. No distress.  HENT:  Head: Normocephalic and atraumatic.  Right Ear: External ear normal.  Left Ear: External ear normal.  Eyes: Conjunctivae are normal. Right eye exhibits no discharge. Left eye exhibits no discharge. No scleral icterus.  Neck: No JVD present. No tracheal deviation present.  Cardiovascular: Normal rate, regular rhythm and normal heart sounds.  Pulmonary/Chest: Effort normal and breath sounds normal. No stridor.  Neurological: He is alert and oriented to person, place, and time.  Skin: Skin is warm and dry. He is not diaphoretic.  Psychiatric: He has a normal mood and affect. His behavior is normal.    BP (!) 148/60   Pulse 63   Ht 5\' 10"  (1.778 m)   Wt 201 lb 6 oz (91.3 kg)   SpO2 97%   BMI 28.89 kg/m  Wt Readings from Last 3 Encounters:  01/13/19 201 lb 6 oz (91.3 kg)  01/01/19 201 lb (91.2 kg)  08/19/18 195 lb 6 oz (88.6 kg)   BP Readings from Last 3 Encounters:  01/13/19 (!) 148/60  01/01/19 (!) 150/64  08/19/18 136/70   Guideline developer:  UpToDate (see UpToDate for funding source) Date Released: June 2014  Health Maintenance Due  Topic Date Due  . [name]  10/09/1946    There are no preventive care reminders to display for this patient.  Lab Results  Component Value Date   TSH 2.650 04/04/2018   Lab Results  Component Value Date   WBC 8.5 08/19/2018   HGB 13.6 08/19/2018   HCT 42.6 08/19/2018   MCV 85.9 08/19/2018   PLT 282.0 08/19/2018   Lab Results  Component Value Date   NA 138 01/09/2019   K 5.0 01/09/2019   CO2 20 01/09/2019   GLUCOSE 90 01/09/2019   BUN 24 (H) 01/09/2019   CREATININE 1.33 01/09/2019   BILITOT 0.5 12/10/2017   ALKPHOS 120 (H) 12/10/2017   AST 11 12/10/2017   ALT 9 12/10/2017   PROT 6.8 01/14/2018   ALBUMIN 3.8 12/10/2017   CALCIUM 8.9 01/09/2019   GFR 50.60 (L) 01/09/2019   Lab Results  Component Value Date    CHOL 134 12/10/2017   Lab Results  Component Value Date   HDL 36.00 (L) 12/10/2017   Lab Results  Component Value Date   LDLCALC 75 12/10/2017   Lab Results  Component Value Date   TRIG 114.0 12/10/2017   Lab Results  Component Value Date   CHOLHDL 4 12/10/2017  No results found for: HGBA1C    Assessment & Plan:   Problem List Items Addressed This Visit      Cardiovascular and Mediastinum   Essential hypertension - Primary   Relevant Medications   cloNIDine (CATAPRES) 0.1 MG tablet     Genitourinary   CKD (chronic kidney disease) stage 3, GFR 30-59 ml/min (HCC)   Relevant Orders   Basic metabolic panel     Other   Hyperkalemia   Relevant Orders   Basic metabolic panel      Meds ordered this encounter  Medications  . cloNIDine (CATAPRES) 0.1 MG tablet    Sig: Take 1 tablet (0.1 mg total) by mouth 2 (two) times daily.    Dispense:  60 tablet    Refill:  3    Follow-up: Return in about 2 weeks (around 01/27/2019), or Stop the lisinopril. Continue Chlorthalidone and Amlodipine. Start Clonidine..   Patient will discontinue lisinopril.  He will continue chlorthalidone and amlodipine.  Have added clonidine 0.1 mg twice daily.  Follow-up in 2 weeks.

## 2019-01-14 LAB — BASIC METABOLIC PANEL
BUN/Creatinine Ratio: 19 (calc) (ref 6–22)
BUN: 26 mg/dL — ABNORMAL HIGH (ref 7–25)
CO2: 24 mmol/L (ref 20–32)
Calcium: 9.1 mg/dL (ref 8.6–10.3)
Chloride: 106 mmol/L (ref 98–110)
Creat: 1.4 mg/dL — ABNORMAL HIGH (ref 0.70–1.11)
Glucose, Bld: 82 mg/dL (ref 65–99)
Potassium: 4.8 mmol/L (ref 3.5–5.3)
Sodium: 139 mmol/L (ref 135–146)

## 2019-01-26 ENCOUNTER — Telehealth: Payer: Self-pay | Admitting: Behavioral Health

## 2019-01-26 NOTE — Telephone Encounter (Signed)

## 2019-01-27 ENCOUNTER — Ambulatory Visit (INDEPENDENT_AMBULATORY_CARE_PROVIDER_SITE_OTHER): Payer: Medicare HMO | Admitting: Family Medicine

## 2019-01-27 ENCOUNTER — Encounter: Payer: Self-pay | Admitting: Family Medicine

## 2019-01-27 VITALS — BP 138/60 | HR 83 | Ht 70.0 in | Wt 203.2 lb

## 2019-01-27 DIAGNOSIS — N183 Chronic kidney disease, stage 3 unspecified: Secondary | ICD-10-CM

## 2019-01-27 DIAGNOSIS — I10 Essential (primary) hypertension: Secondary | ICD-10-CM | POA: Diagnosis not present

## 2019-01-27 DIAGNOSIS — R609 Edema, unspecified: Secondary | ICD-10-CM

## 2019-01-27 DIAGNOSIS — I129 Hypertensive chronic kidney disease with stage 1 through stage 4 chronic kidney disease, or unspecified chronic kidney disease: Secondary | ICD-10-CM | POA: Diagnosis not present

## 2019-01-27 DIAGNOSIS — R0989 Other specified symptoms and signs involving the circulatory and respiratory systems: Secondary | ICD-10-CM | POA: Insufficient documentation

## 2019-01-27 MED ORDER — FUROSEMIDE 20 MG PO TABS: 20.0000 mg | ORAL_TABLET | Freq: Every day | ORAL | 1 refills | Status: DC

## 2019-01-27 NOTE — Progress Notes (Signed)
Established Patient Office Visit  Subjective:  Patient ID: [name], male    DOB: 16-Dec-1927  Age: 85 y.o. MRN: [id number]  CC:  Chief Complaint  Patient presents with  . Follow-up    HPI JOHNMARK GEIGER presents for follow-up of his hypertension, hyperkalemia status post discontinuation of lisinopril and initiation of clonidine 0.1 twice daily.  Blood pressure has been doing well and averaging in the 130s over 70-80.  He is doing okay on the clonidine.  For some time he has noted decrease of energy.  This was also prior to starting clonidine.  Like to have more energy.  Denies chest pain or shortness of breath.  His legs continue to swell during the day and then decreased somewhat overnight.  He is sleeping on one pillow.  Echocardiogram in 2019 showed a 60% ejection fraction.  Past Medical History:  Diagnosis Date  . Branch retinal vein occlusion of left eye   . High cholesterol   . Hypertension   . TIA (transient ischemic attack)     Past Surgical History:  Procedure Laterality Date  . CAROTID ENDARTERECTOMY Right   . HERNIA REPAIR    . INNER EAR SURGERY Right    for hearing loss    Family History  Problem Relation Age of Onset  . Lung cancer Father     Social History   Socioeconomic History  . Marital status: Widowed    Spouse name: Not on file  . Number of children: 4  . Years of education: Not on file  . 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 Financial resource strain: Not on file  . Food insecurity    Worry: Not on file    Inability: Not on file  . Transportation needs    Medical: Not on file    Non-medical: Not on file  Tobacco Use  . Smoking status: Never Smoker  . Smokeless tobacco: Never Used  Substance and Sexual Activity  . Alcohol use: Yes    Comment: 1 glass wine  . Drug use: Never  . Sexual activity: Not on file  Lifestyle  . Physical activity     Days per week: Not on file    Minutes per session: Not on file  . Stress: Not on file  Relationships  . Social Herbalist on phone: Not on file    Gets together: Not on file    Attends religious service: Not on file    Active member of club or organization: Not on file    Attends meetings of clubs or organizations: Not on file    Relationship status: Not on file  . Intimate partner violence    Fear of current or ex partner: Not on file    Emotionally abused: Not on file    Physically abused: Not on file    Forced sexual activity: Not on file  Other Topics Concern  . Not on file  Social History Narrative   Lives alone    Outpatient Medications Prior to Visit  Medication Sig Dispense Refill  . acetaminophen (TYLENOL) 500 MG tablet Take 500 mg by mouth every 6 (six) hours as needed.    [name] amLODipine (NORVASC) 10 MG tablet TAKE 1 TABLET BY MOUTH ONCE DAILY 90 tablet 1  . Cholecalciferol (VITAMIN D3) 2000 units TABS Take 1 tablet by mouth daily.    [name]  cloNIDine (CATAPRES) 0.1 MG tablet Take 1 tablet (0.1 mg total) by mouth 2 (two) times daily. 60 tablet 3  . clopidogrel (PLAVIX) 75 MG tablet Take 1 tablet by mouth once daily 90 tablet 1  . gabapentin (NEURONTIN) 300 MG capsule Take 1 capsule (300 mg total) by mouth 3 (three) times daily. 90 capsule 3  . simvastatin (ZOCOR) 10 MG tablet Take 1 tablet (10 mg total) by mouth at bedtime. 90 tablet 3  . vitamin B-12 (CYANOCOBALAMIN) 500 MCG tablet Take 500 mcg by mouth daily.    . vitamin C (ASCORBIC ACID) 500 MG tablet Take 500 mg by mouth daily.    . chlorthalidone (HYGROTON) 25 MG tablet Take 1 tablet (25 mg total) by mouth daily for 30 days. 30 tablet 0   No facility-administered medications prior to visit.     Allergies  Allergen Reactions  . Codeine Other (See Comments)    No reaction noted  . Penicillins     REACTION: rash    ROS Review of Systems  Constitutional: Negative for diaphoresis, fatigue, fever and  unexpected weight change.  HENT: Negative.   Respiratory: Negative for chest tightness, shortness of breath and wheezing.   Cardiovascular: Positive for leg swelling. Negative for chest pain.  Gastrointestinal: Negative.  Negative for nausea and vomiting.  Endocrine: Negative for polyphagia and polyuria.  Musculoskeletal: Negative for gait problem and joint swelling.  Skin: Negative for color change and pallor.  Allergic/Immunologic: Negative for immunocompromised state.  Neurological: Negative for light-headedness and headaches.  Hematological: Does not bruise/bleed easily.  Psychiatric/Behavioral: Negative.       Objective:    Physical Exam  Constitutional: He is oriented to person, place, and time. He appears well-developed and well-nourished. No distress.  HENT:  Head: Normocephalic and atraumatic.  Right Ear: External ear normal.  Left Ear: External ear normal.  Mouth/Throat: Oropharynx is clear and moist. No oropharyngeal exudate.  Eyes: Pupils are equal, round, and reactive to light. Conjunctivae are normal. Right eye exhibits no discharge. Left eye exhibits no discharge. No scleral icterus.  Neck: No JVD present. No tracheal deviation present. No thyromegaly present.  Cardiovascular: Normal rate, regular rhythm and normal heart sounds.  Pulmonary/Chest: Effort normal. No stridor. He has no decreased breath sounds. He has no rhonchi. He has rales in the right lower field and the left lower field.  Musculoskeletal:        General: Edema present.  Lymphadenopathy:    He has no cervical adenopathy.  Neurological: He is alert and oriented to person, place, and time.  Skin: He is not diaphoretic.  Psychiatric: He has a normal mood and affect.    BP 138/60   Pulse 83   Ht 5\' 10"  (1.778 m)   Wt 203 lb 4 oz (92.2 kg)   SpO2 94%   BMI 29.16 kg/m  Wt Readings from Last 3 Encounters:  01/27/19 203 lb 4 oz (92.2 kg)  01/13/19 201 lb 6 oz (91.3 kg)  01/01/19 201 lb (91.2 kg)    BP Readings from Last 3 Encounters:  01/27/19 138/60  01/13/19 (!) 148/60  01/01/19 (!) 150/64   Guideline developer:  UpToDate (see UpToDate for funding source) Date Released: June 2014  Health Maintenance Due  Topic Date Due  . [name]  10/09/1948    There are no preventive care reminders to display for this patient.  Lab Results  Component Value Date   TSH 2.650 04/04/2018   Lab Results  Component Value Date  WBC 8.5 08/19/2018   HGB 13.6 08/19/2018   HCT 42.6 08/19/2018   MCV 85.9 08/19/2018   PLT 282.0 08/19/2018   Lab Results  Component Value Date   NA 139 01/13/2019   K 4.8 01/13/2019   CO2 24 01/13/2019   GLUCOSE 82 01/13/2019   BUN 26 (H) 01/13/2019   CREATININE 1.40 (H) 01/13/2019   BILITOT 0.5 12/10/2017   ALKPHOS 120 (H) 12/10/2017   AST 11 12/10/2017   ALT 9 12/10/2017   PROT 6.8 01/14/2018   ALBUMIN 3.8 12/10/2017   CALCIUM 9.1 01/13/2019   GFR 50.60 (L) 01/09/2019   Lab Results  Component Value Date   CHOL 134 12/10/2017   Lab Results  Component Value Date   HDL 36.00 (L) 12/10/2017   Lab Results  Component Value Date   LDLCALC 75 12/10/2017   Lab Results  Component Value Date   TRIG 114.0 12/10/2017   Lab Results  Component Value Date   CHOLHDL 4 12/10/2017   No results found for: HGBA1C    Assessment & Plan:   Problem List Items Addressed This Visit      Cardiovascular and Mediastinum   Essential hypertension - Primary   Relevant Medications   furosemide (LASIX) 20 MG tablet   Other Relevant Orders   Basic metabolic panel     Genitourinary   CKD (chronic kidney disease) stage 3, GFR 30-59 ml/min (HCC)   Relevant Orders   Basic metabolic panel     Other   Edema   Relevant Medications   furosemide (LASIX) 20 MG tablet   Bilateral rales   Relevant Medications   furosemide (LASIX) 20 MG tablet      Meds ordered this encounter  Medications  . furosemide (LASIX) 20 MG tablet    Sig: Take 1 tablet (20  mg total) by mouth daily.    Dispense:  30 tablet    Refill:  1    Follow-up: Return in about 2 weeks (around 02/10/2019).   I have discontinued chlorthalidone for now.  Will start Lasix.  Continue clonidine and Norvasc.  Norvasc could be contributing to the lower extremity edema but not the rales.

## 2019-01-28 LAB — BASIC METABOLIC PANEL
BUN/Creatinine Ratio: 20 (ref 10–24)
BUN: 27 mg/dL (ref 8–27)
CO2: 20 mmol/L (ref 20–29)
Calcium: 8.7 mg/dL (ref 8.6–10.2)
Chloride: 106 mmol/L (ref 96–106)
Creatinine, Ser: 1.38 mg/dL — ABNORMAL HIGH (ref 0.76–1.27)
GFR calc Af Amer: 52 mL/min/{1.73_m2} — ABNORMAL LOW (ref >59)
GFR calc non Af Amer: 45 mL/min/{1.73_m2} — ABNORMAL LOW (ref >59)
Glucose: 103 mg/dL — ABNORMAL HIGH (ref 65–99)
Potassium: 4.4 mmol/L (ref 3.5–5.2)
Sodium: 141 mmol/L (ref 134–144)

## 2019-02-12 ENCOUNTER — Ambulatory Visit (INDEPENDENT_AMBULATORY_CARE_PROVIDER_SITE_OTHER): Payer: Medicare HMO | Admitting: Family Medicine

## 2019-02-12 ENCOUNTER — Encounter: Payer: Self-pay | Admitting: Family Medicine

## 2019-02-12 VITALS — BP 138/60 | HR 76 | Ht 70.0 in | Wt 203.2 lb

## 2019-02-12 DIAGNOSIS — I10 Essential (primary) hypertension: Secondary | ICD-10-CM

## 2019-02-12 DIAGNOSIS — E875 Hyperkalemia: Secondary | ICD-10-CM | POA: Diagnosis not present

## 2019-02-12 DIAGNOSIS — R609 Edema, unspecified: Secondary | ICD-10-CM | POA: Diagnosis not present

## 2019-02-12 NOTE — Progress Notes (Addendum)
Established Patient Office Visit  Subjective:  Patient ID: [name], male    DOB: Jun 21, 1928  Age: 85 y.o. MRN: [id number]  CC:  Chief Complaint  Patient presents with  . Follow-up    HPI [name] presents for follow-up of his hypertension, edema and hyperkalemia.  Patient continues with Norvasc, clonidine and Lasix.  Past Medical History:  Diagnosis Date  . Branch retinal vein occlusion of left eye   . High cholesterol   . Hypertension   . TIA (transient ischemic attack)     Past Surgical History:  Procedure Laterality Date  . CAROTID ENDARTERECTOMY Right   . HERNIA REPAIR    . INNER EAR SURGERY Right    for hearing loss    Family History  Problem Relation Age of Onset  . Lung cancer Father     Social History   Socioeconomic History  . Marital status: Widowed    Spouse name: Not on file  . Number of children: 4  . Years of education: Not on file  . 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 Financial resource strain: Not on file  . Food insecurity    Worry: Not on file    Inability: Not on file  . Transportation needs    Medical: Not on file    Non-medical: Not on file  Tobacco Use  . Smoking status: Never Smoker  . Smokeless tobacco: Never Used  Substance and Sexual Activity  . Alcohol use: Yes    Comment: 1 glass wine  . Drug use: Never  . Sexual activity: Not on file  Lifestyle  . Physical activity    Days per week: Not on file    Minutes per session: Not on file  . Stress: Not on file  Relationships  . Social Herbalist on phone: Not on file    Gets together: Not on file    Attends religious service: Not on file    Active member of club or organization: Not on file    Attends meetings of clubs or organizations: Not on file    Relationship status: Not on file  . Intimate partner violence    Fear of current or ex partner: Not on  file    Emotionally abused: Not on file    Physically abused: Not on file    Forced sexual activity: Not on file  Other Topics Concern  . Not on file  Social History Narrative   Lives alone    Outpatient Medications Prior to Visit  Medication Sig Dispense Refill  . acetaminophen (TYLENOL) 500 MG tablet Take 500 mg by mouth every 6 (six) hours as needed.    [name] amLODipine (NORVASC) 10 MG tablet TAKE 1 TABLET BY MOUTH ONCE DAILY 90 tablet 1  . Cholecalciferol (VITAMIN D3) 2000 units TABS Take 1 tablet by mouth daily.    . cloNIDine (CATAPRES) 0.1 MG tablet Take 1 tablet (0.1 mg total) by mouth 2 (two) times daily. 60 tablet 3  . clopidogrel (PLAVIX) 75 MG tablet Take 1 tablet by mouth once daily 90 tablet 1  . furosemide (LASIX) 20 MG tablet Take 1 tablet (20 mg total) by mouth daily. 30 tablet 1  . gabapentin (NEURONTIN) 300 MG capsule Take 1 capsule (300 mg total) by mouth 3 (three) times daily. 90 capsule 3  . simvastatin (ZOCOR) 10 MG tablet Take  1 tablet (10 mg total) by mouth at bedtime. 90 tablet 3  . vitamin B-12 (CYANOCOBALAMIN) 500 MCG tablet Take 500 mcg by mouth daily.    . vitamin C (ASCORBIC ACID) 500 MG tablet Take 500 mg by mouth daily.     No facility-administered medications prior to visit.     Allergies  Allergen Reactions  . Codeine Other (See Comments)    No reaction noted  . Penicillins     REACTION: rash    ROS Review of Systems  Constitutional: Positive for fatigue. Negative for diaphoresis, fever and unexpected weight change.  Respiratory: Negative.   Cardiovascular: Negative.   Gastrointestinal: Negative.   Neurological: Negative for light-headedness and headaches.  Hematological: Negative.   Psychiatric/Behavioral: Negative.       Objective:    Physical Exam  Constitutional: He is oriented to person, place, and time. He appears well-developed and well-nourished. No distress.  HENT:  Head: Normocephalic and atraumatic.  Right Ear: External ear  normal.  Left Ear: External ear normal.  Eyes: Conjunctivae are normal. Right eye exhibits no discharge. Left eye exhibits no discharge. No scleral icterus.  Neck: No JVD present. No tracheal deviation present.  Cardiovascular: Normal rate, regular rhythm and normal heart sounds.  Pulmonary/Chest: Effort normal and breath sounds normal. No stridor.  Musculoskeletal:        General: No edema.  Neurological: He is alert and oriented to person, place, and time.  Skin: He is not diaphoretic.  Psychiatric: He has a normal mood and affect. His behavior is normal.    BP 138/60   Pulse 76   Ht 5\' 10"  (1.778 m)   Wt 203 lb 4 oz (92.2 kg)   SpO2 93%   BMI 29.16 kg/m  Wt Readings from Last 3 Encounters:  02/12/19 203 lb 4 oz (92.2 kg)  01/27/19 203 lb 4 oz (92.2 kg)  01/13/19 201 lb 6 oz (91.3 kg)   BP Readings from Last 3 Encounters:  02/12/19 138/60  01/27/19 138/60  01/13/19 (!) 148/60   Guideline developer:  UpToDate (see UpToDate for funding source) Date Released: June 2014  Health Maintenance Due  Topic Date Due  . [name]  10/09/1948    There are no preventive care reminders to display for this patient.  Lab Results  Component Value Date   TSH 2.650 04/04/2018   Lab Results  Component Value Date   WBC 8.5 08/19/2018   HGB 13.6 08/19/2018   HCT 42.6 08/19/2018   MCV 85.9 08/19/2018   PLT 282.0 08/19/2018   Lab Results  Component Value Date   NA 138 02/12/2019   K 4.6 02/12/2019   CO2 22 02/12/2019   GLUCOSE 101 (H) 02/12/2019   BUN 29 (H) 02/12/2019   CREATININE 1.29 (H) 02/12/2019   BILITOT 0.5 12/10/2017   ALKPHOS 120 (H) 12/10/2017   AST 11 12/10/2017   ALT 9 12/10/2017   PROT 6.8 01/14/2018   ALBUMIN 3.8 12/10/2017   CALCIUM 8.9 02/12/2019   GFR 50.60 (L) 01/09/2019   Lab Results  Component Value Date   CHOL 134 12/10/2017   Lab Results  Component Value Date   HDL 36.00 (L) 12/10/2017   Lab Results  Component Value Date   LDLCALC 75  12/10/2017   Lab Results  Component Value Date   TRIG 114.0 12/10/2017   Lab Results  Component Value Date   CHOLHDL 4 12/10/2017   No results found for: HGBA1C    Assessment & Plan:  Problem List Items Addressed This Visit      Cardiovascular and Mediastinum   Essential hypertension - Primary   Relevant Orders   Basic metabolic panel (Completed)     Other   Hyperkalemia   Relevant Orders   Basic metabolic panel (Completed)   Edema      No orders of the defined types were placed in this encounter.   Follow-up: Return in about 3 months (around 05/15/2019).  Blood pressure is well controlled on above regimen.  May need to discontinue Norvasc and increase clonidine secondary to persistent edema.  Continue Lasix for now.  He will try to add compression stockings for now.  Follow-up 3 month.

## 2019-02-13 ENCOUNTER — Telehealth: Payer: Self-pay

## 2019-02-13 LAB — BASIC METABOLIC PANEL
BUN/Creatinine Ratio: 22 (calc) (ref 6–22)
BUN: 29 mg/dL — ABNORMAL HIGH (ref 7–25)
CO2: 22 mmol/L (ref 20–32)
Calcium: 8.9 mg/dL (ref 8.6–10.3)
Chloride: 103 mmol/L (ref 98–110)
Creat: 1.29 mg/dL — ABNORMAL HIGH (ref 0.70–1.11)
Glucose, Bld: 101 mg/dL — ABNORMAL HIGH (ref 65–99)
Potassium: 4.6 mmol/L (ref 3.5–5.3)
Sodium: 138 mmol/L (ref 135–146)

## 2019-02-13 NOTE — Telephone Encounter (Signed)
error 

## 2019-02-15 ENCOUNTER — Other Ambulatory Visit: Payer: Self-pay | Admitting: Family Medicine

## 2019-02-23 ENCOUNTER — Other Ambulatory Visit: Payer: Self-pay | Admitting: Family Medicine

## 2019-02-23 DIAGNOSIS — G629 Polyneuropathy, unspecified: Secondary | ICD-10-CM

## 2019-03-25 ENCOUNTER — Other Ambulatory Visit: Payer: Self-pay | Admitting: Family Medicine

## 2019-03-25 DIAGNOSIS — R609 Edema, unspecified: Secondary | ICD-10-CM

## 2019-03-25 DIAGNOSIS — I10 Essential (primary) hypertension: Secondary | ICD-10-CM

## 2019-03-25 DIAGNOSIS — R0989 Other specified symptoms and signs involving the circulatory and respiratory systems: Secondary | ICD-10-CM

## 2019-03-28 ENCOUNTER — Other Ambulatory Visit: Payer: Self-pay | Admitting: Family Medicine

## 2019-03-28 DIAGNOSIS — G629 Polyneuropathy, unspecified: Secondary | ICD-10-CM

## 2019-04-01 DIAGNOSIS — H52203 Unspecified astigmatism, bilateral: Secondary | ICD-10-CM | POA: Diagnosis not present

## 2019-04-01 DIAGNOSIS — H5203 Hypermetropia, bilateral: Secondary | ICD-10-CM | POA: Diagnosis not present

## 2019-04-01 DIAGNOSIS — H43813 Vitreous degeneration, bilateral: Secondary | ICD-10-CM | POA: Diagnosis not present

## 2019-04-01 DIAGNOSIS — H26492 Other secondary cataract, left eye: Secondary | ICD-10-CM | POA: Diagnosis not present

## 2019-04-01 DIAGNOSIS — H524 Presbyopia: Secondary | ICD-10-CM | POA: Diagnosis not present

## 2019-04-01 DIAGNOSIS — H35352 Cystoid macular degeneration, left eye: Secondary | ICD-10-CM | POA: Diagnosis not present

## 2019-04-01 DIAGNOSIS — Z961 Presence of intraocular lens: Secondary | ICD-10-CM | POA: Diagnosis not present

## 2019-04-01 DIAGNOSIS — H43393 Other vitreous opacities, bilateral: Secondary | ICD-10-CM | POA: Diagnosis not present

## 2019-04-15 ENCOUNTER — Ambulatory Visit (INDEPENDENT_AMBULATORY_CARE_PROVIDER_SITE_OTHER): Payer: Medicare HMO

## 2019-04-15 ENCOUNTER — Other Ambulatory Visit: Payer: Self-pay

## 2019-04-15 DIAGNOSIS — Z23 Encounter for immunization: Secondary | ICD-10-CM

## 2019-04-15 NOTE — Progress Notes (Signed)
Pt came in for his flu shot. Vaccine given in the left deltoid. Pt tolerated injection well, no signs/symptoms of a reaction prior to leaving the office. VIS given to pt.

## 2019-04-24 ENCOUNTER — Other Ambulatory Visit: Payer: Self-pay | Admitting: Family Medicine

## 2019-04-24 DIAGNOSIS — I10 Essential (primary) hypertension: Secondary | ICD-10-CM

## 2019-04-24 DIAGNOSIS — R609 Edema, unspecified: Secondary | ICD-10-CM

## 2019-04-24 DIAGNOSIS — R0989 Other specified symptoms and signs involving the circulatory and respiratory systems: Secondary | ICD-10-CM

## 2019-04-27 ENCOUNTER — Other Ambulatory Visit: Payer: Self-pay | Admitting: Family Medicine

## 2019-04-27 DIAGNOSIS — G629 Polyneuropathy, unspecified: Secondary | ICD-10-CM

## 2019-05-14 ENCOUNTER — Telehealth: Payer: Self-pay

## 2019-05-14 NOTE — Telephone Encounter (Signed)

## 2019-05-15 ENCOUNTER — Ambulatory Visit (INDEPENDENT_AMBULATORY_CARE_PROVIDER_SITE_OTHER): Payer: Medicare HMO

## 2019-05-15 ENCOUNTER — Other Ambulatory Visit: Payer: Self-pay

## 2019-05-15 ENCOUNTER — Encounter: Payer: Self-pay | Admitting: Family Medicine

## 2019-05-15 ENCOUNTER — Ambulatory Visit (INDEPENDENT_AMBULATORY_CARE_PROVIDER_SITE_OTHER): Payer: Medicare HMO | Admitting: Family Medicine

## 2019-05-15 VITALS — BP 138/70 | HR 66 | Ht 70.0 in | Wt 204.0 lb

## 2019-05-15 DIAGNOSIS — R0989 Other specified symptoms and signs involving the circulatory and respiratory systems: Secondary | ICD-10-CM | POA: Diagnosis not present

## 2019-05-15 DIAGNOSIS — G459 Transient cerebral ischemic attack, unspecified: Secondary | ICD-10-CM

## 2019-05-15 DIAGNOSIS — I10 Essential (primary) hypertension: Secondary | ICD-10-CM

## 2019-05-15 DIAGNOSIS — R0982 Postnasal drip: Secondary | ICD-10-CM

## 2019-05-15 DIAGNOSIS — R9389 Abnormal findings on diagnostic imaging of other specified body structures: Secondary | ICD-10-CM

## 2019-05-15 DIAGNOSIS — R609 Edema, unspecified: Secondary | ICD-10-CM

## 2019-05-15 DIAGNOSIS — N1831 Chronic kidney disease, stage 3a: Secondary | ICD-10-CM

## 2019-05-15 DIAGNOSIS — I6523 Occlusion and stenosis of bilateral carotid arteries: Secondary | ICD-10-CM

## 2019-05-15 DIAGNOSIS — E559 Vitamin D deficiency, unspecified: Secondary | ICD-10-CM

## 2019-05-15 DIAGNOSIS — E7800 Pure hypercholesterolemia, unspecified: Secondary | ICD-10-CM

## 2019-05-15 DIAGNOSIS — J984 Other disorders of lung: Secondary | ICD-10-CM | POA: Diagnosis not present

## 2019-05-15 LAB — COMPREHENSIVE METABOLIC PANEL
ALT: 9 U/L (ref 0–53)
AST: 13 U/L (ref 0–37)
Albumin: 4 g/dL (ref 3.5–5.2)
Alkaline Phosphatase: 112 U/L (ref 39–117)
BUN: 29 mg/dL — ABNORMAL HIGH (ref 6–23)
CO2: 24 mEq/L (ref 19–32)
Calcium: 8.9 mg/dL (ref 8.4–10.5)
Chloride: 104 mEq/L (ref 96–112)
Creatinine, Ser: 1.39 mg/dL (ref 0.40–1.50)
GFR: 48.05 mL/min — ABNORMAL LOW (ref >60.00)
Glucose, Bld: 142 mg/dL — ABNORMAL HIGH (ref 70–99)
Potassium: 4 mEq/L (ref 3.5–5.1)
Sodium: 139 mEq/L (ref 135–145)
Total Bilirubin: 0.6 mg/dL (ref 0.2–1.2)
Total Protein: 6.7 g/dL (ref 6.0–8.3)

## 2019-05-15 LAB — CBC
HCT: 44.8 % (ref 39.0–52.0)
Hemoglobin: 14.4 g/dL (ref 13.0–17.0)
MCHC: 32.1 g/dL (ref 30.0–36.0)
MCV: 85.9 fl (ref 78.0–100.0)
Platelets: 231 10*3/uL (ref 150.0–400.0)
RBC: 5.22 Mil/uL (ref 4.22–5.81)
RDW: 13.6 % (ref 11.5–15.5)
WBC: 7.2 10*3/uL (ref 4.0–10.5)

## 2019-05-15 LAB — URINALYSIS, ROUTINE W REFLEX MICROSCOPIC
Bilirubin Urine: NEGATIVE
Hgb urine dipstick: NEGATIVE
Ketones, ur: NEGATIVE
Leukocytes,Ua: NEGATIVE
Nitrite: NEGATIVE
RBC / HPF: NONE SEEN (ref 0–?)
Specific Gravity, Urine: 1.025 (ref 1.000–1.030)
Total Protein, Urine: NEGATIVE
Urine Glucose: NEGATIVE
Urobilinogen, UA: 0.2 (ref 0.0–1.0)
WBC, UA: NONE SEEN (ref 0–?)
pH: 5.5 (ref 5.0–8.0)

## 2019-05-15 LAB — BASIC METABOLIC PANEL
BUN: 29 mg/dL — ABNORMAL HIGH (ref 6–23)
CO2: 24 mEq/L (ref 19–32)
Calcium: 8.9 mg/dL (ref 8.4–10.5)
Chloride: 104 mEq/L (ref 96–112)
Creatinine, Ser: 1.39 mg/dL (ref 0.40–1.50)
GFR: 48.05 mL/min — ABNORMAL LOW (ref >60.00)
Glucose, Bld: 142 mg/dL — ABNORMAL HIGH (ref 70–99)
Potassium: 4 mEq/L (ref 3.5–5.1)
Sodium: 139 mEq/L (ref 135–145)

## 2019-05-15 LAB — BRAIN NATRIURETIC PEPTIDE: Pro B Natriuretic peptide (BNP): 65 pg/mL (ref 0.0–100.0)

## 2019-05-15 LAB — VITAMIN D 25 HYDROXY (VIT D DEFICIENCY, FRACTURES): VITD: 35.22 ng/mL (ref 30.00–100.00)

## 2019-05-15 LAB — LDL CHOLESTEROL, DIRECT: Direct LDL: 88 mg/dL

## 2019-05-15 MED ORDER — SIMVASTATIN 10 MG PO TABS: 10.0000 mg | ORAL_TABLET | Freq: Every day | ORAL | 3 refills | Status: DC

## 2019-05-15 MED ORDER — FUROSEMIDE 20 MG PO TABS: 20.0000 mg | ORAL_TABLET | Freq: Every day | ORAL | 0 refills | Status: DC

## 2019-05-15 MED ORDER — AMLODIPINE BESYLATE 10 MG PO TABS: 10.0000 mg | ORAL_TABLET | Freq: Every day | ORAL | 1 refills | Status: DC

## 2019-05-15 MED ORDER — CLOPIDOGREL BISULFATE 75 MG PO TABS: 75.0000 mg | ORAL_TABLET | Freq: Every day | ORAL | 1 refills | Status: DC

## 2019-05-15 MED ORDER — CLONIDINE HCL 0.1 MG PO TABS: 0.1000 mg | ORAL_TABLET | Freq: Two times a day (BID) | ORAL | 3 refills | Status: DC

## 2019-05-15 NOTE — Progress Notes (Addendum)
Established Patient Office Visit  Subjective:  Patient ID: [name], male    DOB: 1928/07/13  Age: 85 y.o. MRN: [id number]  CC:  Chief Complaint  Patient presents with  . Follow-up    HPI [name] presents for follow-up of his hypertension, CKD, elevated cholesterol, vitamin D deficiency and lower extremity edema.  Patient denies fever chills phlegm production or cough.  Denies chest pain or shortness of breath other than some dyspnea with exertion.  He sleeps on 1 pillow.  Lower extremity edema is well controlled with compression stockings.  He has no history of CHF.  He does experience some postnasal drip.  He is having no issues tolerating the clonidine.  Blood pressure is well controlled.  Past Medical History:  Diagnosis Date  . Branch retinal vein occlusion of left eye   . High cholesterol   . Hypertension   . TIA (transient ischemic attack)     Past Surgical History:  Procedure Laterality Date  . CAROTID ENDARTERECTOMY Right   . HERNIA REPAIR    . INNER EAR SURGERY Right    for hearing loss    Family History  Problem Relation Age of Onset  . Lung cancer Father     Social History   Socioeconomic History  . Marital status: Widowed    Spouse name: Not on file  . Number of children: 4  . Years of education: Not on file  . 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 Financial resource strain: Not on file  . Food insecurity    Worry: Not on file    Inability: Not on file  . Transportation needs    Medical: Not on file    Non-medical: Not on file  Tobacco Use  . Smoking status: Never Smoker  . Smokeless tobacco: Never Used  Substance and Sexual Activity  . Alcohol use: Yes    Comment: 1 glass wine  . Drug use: Never  . Sexual activity: Not on file  Lifestyle  . Physical activity    Days per week: Not on file    Minutes per session: Not on file  . Stress:  Not on file  Relationships  . Social Herbalist on phone: Not on file    Gets together: Not on file    Attends religious service: Not on file    Active member of club or organization: Not on file    Attends meetings of clubs or organizations: Not on file    Relationship status: Not on file  . Intimate partner violence    Fear of current or ex partner: Not on file    Emotionally abused: Not on file    Physically abused: Not on file    Forced sexual activity: Not on file  Other Topics Concern  . Not on file  Social History Narrative   Lives alone    Outpatient Medications Prior to Visit  Medication Sig Dispense Refill  . acetaminophen (TYLENOL) 500 MG tablet Take 500 mg by mouth every 6 (six) hours as needed.    . Cholecalciferol (VITAMIN D3) 2000 units TABS Take 1 tablet by mouth daily.    [name] gabapentin (NEURONTIN) 300 MG capsule TAKE 1 CAPSULE BY MOUTH THREE TIMES DAILY 90 capsule 0  . vitamin B-12 (CYANOCOBALAMIN) 500 MCG tablet Take 500 mcg by mouth daily.    . vitamin C (  ASCORBIC ACID) 500 MG tablet Take 500 mg by mouth daily.    [name] amLODipine (NORVASC) 10 MG tablet Take 1 tablet by mouth once daily 90 tablet 0  . cloNIDine (CATAPRES) 0.1 MG tablet Take 1 tablet (0.1 mg total) by mouth 2 (two) times daily. 60 tablet 3  . clopidogrel (PLAVIX) 75 MG tablet Take 1 tablet by mouth once daily 90 tablet 1  . furosemide (LASIX) 20 MG tablet Take 1 tablet by mouth once daily 30 tablet 0  . simvastatin (ZOCOR) 10 MG tablet Take 1 tablet (10 mg total) by mouth at bedtime. 90 tablet 3   No facility-administered medications prior to visit.     Allergies  Allergen Reactions  . Codeine Other (See Comments)    No reaction noted  . Penicillins     REACTION: rash    ROS Review of Systems  Constitutional: Negative for diaphoresis, fatigue, fever and unexpected weight change.  HENT: Positive for postnasal drip. Negative for rhinorrhea, sinus pressure and sinus pain.   Eyes:  Negative for photophobia and visual disturbance.  Respiratory: Negative for chest tightness, shortness of breath and wheezing.   Cardiovascular: Positive for leg swelling. Negative for chest pain and palpitations.  Gastrointestinal: Negative.  Negative for nausea and vomiting.  Endocrine: Negative for polyphagia and polyuria.  Genitourinary: Negative.   Musculoskeletal: Positive for gait problem.  Skin: Negative for pallor and rash.  Neurological: Negative for weakness and numbness.  Hematological: Does not bruise/bleed easily.  Psychiatric/Behavioral: Negative.       Objective:    Physical Exam  Constitutional: He is oriented to person, place, and time. He appears well-developed and well-nourished. No distress.  HENT:  Head: Normocephalic and atraumatic.  Right Ear: External ear normal.  Left Ear: External ear normal.  Mouth/Throat: Oropharynx is clear and moist. No oropharyngeal exudate.  Eyes: Pupils are equal, round, and reactive to light. Conjunctivae are normal. Right eye exhibits no discharge. Left eye exhibits no discharge. No scleral icterus.  Neck: Neck supple. No JVD present. No tracheal deviation present. No thyromegaly present.  Cardiovascular: Normal rate, regular rhythm and normal heart sounds. Exam reveals no gallop.  Pulmonary/Chest: Effort normal. No stridor. No respiratory distress. He has no wheezes. He has rales in the right lower field and the left lower field.  Abdominal: Bowel sounds are normal.  Musculoskeletal:        General: No edema (compression stockings worn bilaterally).  Lymphadenopathy:    He has no cervical adenopathy.  Neurological: He is alert and oriented to person, place, and time.  Skin: Skin is warm and dry. He is not diaphoretic.  Psychiatric: He has a normal mood and affect. His behavior is normal.    BP 138/70   Pulse 66   Ht 5\' 10"  (1.778 m)   Wt 204 lb (92.5 kg)   SpO2 93%   BMI 29.27 kg/m  Wt Readings from Last 3 Encounters:   05/15/19 204 lb (92.5 kg)  02/12/19 203 lb 4 oz (92.2 kg)  01/27/19 203 lb 4 oz (92.2 kg)   BP Readings from Last 3 Encounters:  05/15/19 138/70  02/12/19 138/60  01/27/19 138/60   Guideline developer:  UpToDate (see UpToDate for funding source) Date Released: June 2014  There are no preventive care reminders to display for this patient.  There are no preventive care reminders to display for this patient.  Lab Results  Component Value Date   TSH 2.650 04/04/2018   Lab Results  Component  Value Date   WBC 7.2 05/15/2019   HGB 14.4 05/15/2019   HCT 44.8 05/15/2019   MCV 85.9 05/15/2019   PLT 231.0 05/15/2019   Lab Results  Component Value Date   NA 139 05/15/2019   NA 139 05/15/2019   K 4.0 05/15/2019   K 4.0 05/15/2019   CO2 24 05/15/2019   CO2 24 05/15/2019   GLUCOSE 142 (H) 05/15/2019   GLUCOSE 142 (H) 05/15/2019   BUN 29 (H) 05/15/2019   BUN 29 (H) 05/15/2019   CREATININE 1.39 05/15/2019   CREATININE 1.39 05/15/2019   BILITOT 0.6 05/15/2019   ALKPHOS 112 05/15/2019   AST 13 05/15/2019   ALT 9 05/15/2019   PROT 6.7 05/15/2019   ALBUMIN 4.0 05/15/2019   CALCIUM 8.9 05/15/2019   CALCIUM 8.9 05/15/2019   GFR 48.05 (L) 05/15/2019   GFR 48.05 (L) 05/15/2019   Lab Results  Component Value Date   CHOL 134 12/10/2017   Lab Results  Component Value Date   HDL 36.00 (L) 12/10/2017   Lab Results  Component Value Date   LDLCALC 75 12/10/2017   Lab Results  Component Value Date   TRIG 114.0 12/10/2017   Lab Results  Component Value Date   CHOLHDL 4 12/10/2017   No results found for: HGBA1C    Assessment & Plan:   Problem List Items Addressed This Visit      Cardiovascular and Mediastinum   Essential hypertension - Primary   Relevant Medications   amLODipine (NORVASC) 10 MG tablet   cloNIDine (CATAPRES) 0.1 MG tablet   furosemide (LASIX) 20 MG tablet   simvastatin (ZOCOR) 10 MG tablet   Other Relevant Orders   CBC (Completed)   Basic  metabolic panel (Completed)   Comprehensive metabolic panel (Completed)   Urinalysis, Routine w reflex microscopic (Completed)   Carotid disease, bilateral (HCC)   Relevant Medications   amLODipine (NORVASC) 10 MG tablet   cloNIDine (CATAPRES) 0.1 MG tablet   furosemide (LASIX) 20 MG tablet   simvastatin (ZOCOR) 10 MG tablet   Other Relevant Orders   LDL cholesterol, direct (Completed)   Transient cerebral ischemia   Relevant Medications   amLODipine (NORVASC) 10 MG tablet   cloNIDine (CATAPRES) 0.1 MG tablet   clopidogrel (PLAVIX) 75 MG tablet   furosemide (LASIX) 20 MG tablet   simvastatin (ZOCOR) 10 MG tablet     Genitourinary   CKD (chronic kidney disease) stage 3, GFR 30-59 ml/min     Other   PND (post-nasal drip)   Elevated cholesterol   Relevant Medications   amLODipine (NORVASC) 10 MG tablet   cloNIDine (CATAPRES) 0.1 MG tablet   furosemide (LASIX) 20 MG tablet   simvastatin (ZOCOR) 10 MG tablet   Other Relevant Orders   LDL cholesterol, direct (Completed)   Edema   Relevant Medications   furosemide (LASIX) 20 MG tablet   Bilateral rales   Relevant Medications   furosemide (LASIX) 20 MG tablet   Other Relevant Orders   B Nat Peptide (Completed)   DG Chest 2 View (Completed)   CT Chest Wo Contrast   Ambulatory referral to Pulmonology    Other Visit Diagnoses    Vitamin D deficiency       Relevant Orders   VITAMIN D 25 Hydroxy (Vit-D Deficiency, Fractures) (Completed)      Meds ordered this encounter  Medications  . amLODipine (NORVASC) 10 MG tablet    Sig: Take 1 tablet (10 mg total) by mouth daily.  Dispense:  90 tablet    Refill:  1  . cloNIDine (CATAPRES) 0.1 MG tablet    Sig: Take 1 tablet (0.1 mg total) by mouth 2 (two) times daily.    Dispense:  60 tablet    Refill:  3  . clopidogrel (PLAVIX) 75 MG tablet    Sig: Take 1 tablet (75 mg total) by mouth daily.    Dispense:  90 tablet    Refill:  1  . furosemide (LASIX) 20 MG tablet    Sig:  Take 1 tablet (20 mg total) by mouth daily.    Dispense:  30 tablet    Refill:  0  . simvastatin (ZOCOR) 10 MG tablet    Sig: Take 1 tablet (10 mg total) by mouth at bedtime.    Dispense:  90 tablet    Refill:  3    Follow-up: Return in about 1 month (around 06/15/2019).   Have added a short course of Lasix.  Chest x-ray and BNP pending.  May need cardiology referral for echocardiogram.  Question new onset of CHF.  Patient will consume a banana daily for me.  Patient will use the nasal steroid of his choice for his postnasal drip.

## 2019-05-18 DIAGNOSIS — H43813 Vitreous degeneration, bilateral: Secondary | ICD-10-CM | POA: Diagnosis not present

## 2019-05-18 DIAGNOSIS — H35352 Cystoid macular degeneration, left eye: Secondary | ICD-10-CM | POA: Diagnosis not present

## 2019-05-18 DIAGNOSIS — H34832 Tributary (branch) retinal vein occlusion, left eye, with macular edema: Secondary | ICD-10-CM | POA: Diagnosis not present

## 2019-05-18 NOTE — Addendum Note (Signed)
Addended by: Jon Billings on: 05/18/2019 08:49 AM   Modules accepted: Orders

## 2019-05-22 ENCOUNTER — Ambulatory Visit
Admission: RE | Admit: 2019-05-22 | Discharge: 2019-05-22 | Disposition: A | Discharge status: Home / Self Care | Payer: Medicare HMO | Source: Ambulatory Visit | Attending: Family Medicine | Admitting: Family Medicine

## 2019-05-22 DIAGNOSIS — R0989 Other specified symptoms and signs involving the circulatory and respiratory systems: Secondary | ICD-10-CM

## 2019-05-22 DIAGNOSIS — E559 Vitamin D deficiency, unspecified: Secondary | ICD-10-CM | POA: Insufficient documentation

## 2019-05-22 DIAGNOSIS — I2510 Atherosclerotic heart disease of native coronary artery without angina pectoris: Secondary | ICD-10-CM | POA: Diagnosis not present

## 2019-05-22 DIAGNOSIS — M47814 Spondylosis without myelopathy or radiculopathy, thoracic region: Secondary | ICD-10-CM | POA: Diagnosis not present

## 2019-05-22 DIAGNOSIS — J984 Other disorders of lung: Secondary | ICD-10-CM | POA: Diagnosis not present

## 2019-05-22 DIAGNOSIS — R9389 Abnormal findings on diagnostic imaging of other specified body structures: Secondary | ICD-10-CM | POA: Insufficient documentation

## 2019-05-22 DIAGNOSIS — I700 Atherosclerosis of aorta: Secondary | ICD-10-CM | POA: Diagnosis not present

## 2019-05-27 ENCOUNTER — Other Ambulatory Visit: Payer: Self-pay | Admitting: Family Medicine

## 2019-05-27 DIAGNOSIS — G629 Polyneuropathy, unspecified: Secondary | ICD-10-CM

## 2019-06-24 ENCOUNTER — Other Ambulatory Visit: Payer: Self-pay | Admitting: Family Medicine

## 2019-06-24 DIAGNOSIS — R609 Edema, unspecified: Secondary | ICD-10-CM

## 2019-06-24 DIAGNOSIS — I10 Essential (primary) hypertension: Secondary | ICD-10-CM

## 2019-06-24 DIAGNOSIS — R0989 Other specified symptoms and signs involving the circulatory and respiratory systems: Secondary | ICD-10-CM

## 2019-06-26 ENCOUNTER — Other Ambulatory Visit: Payer: Self-pay | Admitting: Family Medicine

## 2019-06-26 DIAGNOSIS — G629 Polyneuropathy, unspecified: Secondary | ICD-10-CM

## 2019-07-23 ENCOUNTER — Other Ambulatory Visit: Payer: Self-pay | Admitting: Family Medicine

## 2019-07-23 DIAGNOSIS — R609 Edema, unspecified: Secondary | ICD-10-CM

## 2019-07-23 DIAGNOSIS — I10 Essential (primary) hypertension: Secondary | ICD-10-CM

## 2019-07-23 DIAGNOSIS — R0989 Other specified symptoms and signs involving the circulatory and respiratory systems: Secondary | ICD-10-CM

## 2019-07-23 NOTE — Telephone Encounter (Signed)
Pt aware to keep upcoming appt 07/27/19 for f/u on medication and refills

## 2019-07-27 ENCOUNTER — Other Ambulatory Visit: Payer: Self-pay

## 2019-07-27 ENCOUNTER — Encounter: Payer: Self-pay | Admitting: Family Medicine

## 2019-07-27 ENCOUNTER — Ambulatory Visit (INDEPENDENT_AMBULATORY_CARE_PROVIDER_SITE_OTHER): Payer: Medicare HMO | Admitting: Family Medicine

## 2019-07-27 VITALS — BP 141/68 | HR 54 | Ht 70.0 in | Wt 206.0 lb

## 2019-07-27 DIAGNOSIS — N1831 Chronic kidney disease, stage 3a: Secondary | ICD-10-CM | POA: Diagnosis not present

## 2019-07-27 DIAGNOSIS — G629 Polyneuropathy, unspecified: Secondary | ICD-10-CM | POA: Diagnosis not present

## 2019-07-27 DIAGNOSIS — I10 Essential (primary) hypertension: Secondary | ICD-10-CM | POA: Diagnosis not present

## 2019-07-27 MED ORDER — GABAPENTIN 300 MG PO CAPS: 300.0000 mg | ORAL_CAPSULE | Freq: Three times a day (TID) | ORAL | 2 refills | Status: DC

## 2019-07-27 NOTE — Progress Notes (Signed)
Established Patient Office Visit  Subjective:  Patient ID: [name], male    DOB: Oct 16, 1927  Age: 85 y.o. MRN: [id number]  CC:  Chief Complaint  Patient presents with  . Follow-up    f/u on medication pt needs refill on meds. 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  Blood pressures been well controlled on the clonidine, amlodipine and Lasix combination.  He feels much better.  Pressures have been running in the 140/70 range.  Would like to continue the gabapentin for his neuropathy as it is been quite helpful.  Believes that the clonidine is causing some dryness in his mouth but the side effect is tolerable.  Past Medical History:  Diagnosis Date  . Branch retinal vein occlusion of left eye   . High cholesterol   . Hypertension   . TIA (transient ischemic attack)     Past Surgical History:  Procedure Laterality Date  . CAROTID ENDARTERECTOMY Right   . HERNIA REPAIR    . INNER EAR SURGERY Right    for hearing loss    Family History  Problem Relation Age of Onset  . Lung cancer Father     Social History   Socioeconomic History  . Marital status: Widowed    Spouse name: Not on file  . Number of children: 4  . Years of education: Not on file  . 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 Smoking status: Never Smoker  . Smokeless tobacco: Never Used  Substance and Sexual Activity  . Alcohol use: Yes    Comment: 1 glass wine  . Drug use: Never  . Sexual activity: Not on file  Other Topics Concern  . Not on file  Social History Narrative   Lives alone   Social Determinants of Health   Financial Resource Strain:   . Difficulty of Paying Living Expenses: Not on file  Food Insecurity:   . Worried About Charity fundraiser in the Last Year: Not on file  . Ran Out of Food in the Last Year:  Not on file  Transportation Needs:   . Lack of Transportation (Medical): Not on file  . Lack of Transportation (Non-Medical): Not on file  Physical Activity:   . Days of Exercise per Week: Not on file  . Minutes of Exercise per Session: Not on file  Stress:   . Feeling of Stress : Not on file  Social Connections:   . Frequency of Communication with Friends and Family: Not on file  . Frequency of Social Gatherings with Friends and Family: Not on file  . Attends Religious Services: Not on file  . Active Member of Clubs or Organizations: Not on file  . Attends Archivist Meetings: Not on file  . Marital Status: Not on file  Intimate Partner Violence:   . Fear of Current or Ex-Partner: Not on file  . Emotionally Abused: Not on file  . Physically Abused: Not on file  . Sexually Abused: Not on file    Outpatient Medications Prior to Visit  Medication Sig Dispense Refill  . acetaminophen (TYLENOL) 500 MG tablet Take 500 mg by mouth every 6 (six) hours as needed.    [name] amLODipine (NORVASC) 10 MG tablet Take 1 tablet (10 mg total) by mouth daily. 90 tablet 1  .  Cholecalciferol (VITAMIN D3) 2000 units TABS Take 1 tablet by mouth daily.    . cloNIDine (CATAPRES) 0.1 MG tablet Take 1 tablet (0.1 mg total) by mouth 2 (two) times daily. 60 tablet 3  . clopidogrel (PLAVIX) 75 MG tablet Take 1 tablet (75 mg total) by mouth daily. 90 tablet 1  . furosemide (LASIX) 20 MG tablet Take 1 tablet by mouth once daily 30 tablet 0  . simvastatin (ZOCOR) 10 MG tablet Take 1 tablet (10 mg total) by mouth at bedtime. 90 tablet 3  . vitamin B-12 (CYANOCOBALAMIN) 500 MCG tablet Take 500 mcg by mouth daily.    . vitamin C (ASCORBIC ACID) 500 MG tablet Take 500 mg by mouth daily.    [name] gabapentin (NEURONTIN) 300 MG capsule TAKE 1 CAPSULE BY MOUTH THREE TIMES DAILY 90 capsule 0   No facility-administered medications prior to visit.    Allergies  Allergen Reactions  . Codeine Other (See Comments)     No reaction noted  . Penicillins     REACTION: rash    ROS Review of Systems  Constitutional: Negative.   Eyes: Negative.   Respiratory: Negative.   Cardiovascular: Negative.   Gastrointestinal: Negative.   Musculoskeletal: Negative for gait problem and joint swelling.  Neurological: Negative for weakness and headaches.  Psychiatric/Behavioral: Negative.       Objective:    Physical Exam  Constitutional: He is oriented to person, place, and time. He appears well-developed and well-nourished. No distress.  HENT:  Head: Normocephalic and atraumatic.  Right Ear: External ear normal.  Left Ear: External ear normal.  Eyes: Conjunctivae are normal. Right eye exhibits no discharge. Left eye exhibits no discharge. No scleral icterus.  Neck: No JVD present. No tracheal deviation present.  Pulmonary/Chest: Effort normal. No stridor.  Neurological: He is alert and oriented to person, place, and time.  Skin: Skin is warm and dry. He is not diaphoretic.  Psychiatric: He has a normal mood and affect. His behavior is normal.    BP (!) 141/68 Comment: per pt  Pulse (!) 54 Comment: per pt  Ht 5\' 10"  (1.778 m)   Wt 206 lb (93.4 kg) Comment: per pt  SpO2 98% Comment: per pt  BMI 29.56 kg/m  Wt Readings from Last 3 Encounters:  07/27/19 206 lb (93.4 kg)  05/15/19 204 lb (92.5 kg)  02/12/19 203 lb 4 oz (92.2 kg)     There are no preventive care reminders to display for this patient.  There are no preventive care reminders to display for this patient.  Lab Results  Component Value Date   TSH 2.650 04/04/2018   Lab Results  Component Value Date   WBC 7.2 05/15/2019   HGB 14.4 05/15/2019   HCT 44.8 05/15/2019   MCV 85.9 05/15/2019   PLT 231.0 05/15/2019   Lab Results  Component Value Date   NA 139 05/15/2019   NA 139 05/15/2019   K 4.0 05/15/2019   K 4.0 05/15/2019   CO2 24 05/15/2019   CO2 24 05/15/2019   GLUCOSE 142 (H) 05/15/2019   GLUCOSE 142 (H) 05/15/2019   BUN  29 (H) 05/15/2019   BUN 29 (H) 05/15/2019   CREATININE 1.39 05/15/2019   CREATININE 1.39 05/15/2019   BILITOT 0.6 05/15/2019   ALKPHOS 112 05/15/2019   AST 13 05/15/2019   ALT 9 05/15/2019   PROT 6.7 05/15/2019   ALBUMIN 4.0 05/15/2019   CALCIUM 8.9 05/15/2019   CALCIUM 8.9 05/15/2019   GFR  48.05 (L) 05/15/2019   GFR 48.05 (L) 05/15/2019   Lab Results  Component Value Date   CHOL 134 12/10/2017   Lab Results  Component Value Date   HDL 36.00 (L) 12/10/2017   Lab Results  Component Value Date   LDLCALC 75 12/10/2017   Lab Results  Component Value Date   TRIG 114.0 12/10/2017   Lab Results  Component Value Date   CHOLHDL 4 12/10/2017   No results found for: HGBA1C    Assessment & Plan:   Problem List Items Addressed This Visit      Cardiovascular and Mediastinum   Essential hypertension - Primary   Relevant Orders   Basic Metabolic Panel (BMET)     Nervous and Auditory   Neuropathy   Relevant Medications   gabapentin (NEURONTIN) 300 MG capsule     Genitourinary   Stage 3a chronic kidney disease   Relevant Orders   Basic Metabolic Panel (BMET)      Meds ordered this encounter  Medications  . gabapentin (NEURONTIN) 300 MG capsule    Sig: Take 1 capsule (300 mg total) by mouth 3 (three) times daily.    Dispense:  90 capsule    Refill:  2    Follow-up: Return in about 3 months (around 10/25/2019), or return in 2 weeks for blood work.Libby Maw, MD   Virtual Visit via Video Note  I connected with [name] on 07/27/19 at  1:30 PM EST by a video enabled telemedicine application and verified that I am speaking with the correct person using two identifiers.  Location: Patient: home with daughter.  Provider:   I discussed the limitations of evaluation and management by telemedicine and the availability of in person appointments. The patient expressed understanding and agreed to proceed.  History of Present Illness:      Observations/Objective:   Assessment and Plan:   Follow Up Instructions:    I discussed the assessment and treatment plan with the patient. The patient was provided an opportunity to ask questions and all were answered. The patient agreed with the plan and demonstrated an understanding of the instructions.   The patient was advised to call back or seek an in-person evaluation if the symptoms worsen or if the condition fails to improve as anticipated.  I provided 20 minutes of non-face-to-face time during this encounter.   Libby Maw, MD

## 2019-08-10 ENCOUNTER — Other Ambulatory Visit: Payer: Self-pay

## 2019-08-11 ENCOUNTER — Other Ambulatory Visit (INDEPENDENT_AMBULATORY_CARE_PROVIDER_SITE_OTHER): Payer: Medicare HMO

## 2019-08-11 DIAGNOSIS — I10 Essential (primary) hypertension: Secondary | ICD-10-CM | POA: Diagnosis not present

## 2019-08-11 DIAGNOSIS — N1831 Chronic kidney disease, stage 3a: Secondary | ICD-10-CM

## 2019-08-11 LAB — BASIC METABOLIC PANEL WITH GFR
BUN: 23 mg/dL (ref 6–23)
CO2: 28 meq/L (ref 19–32)
Calcium: 9.2 mg/dL (ref 8.4–10.5)
Chloride: 100 meq/L (ref 96–112)
Creatinine, Ser: 1.37 mg/dL (ref 0.40–1.50)
GFR: 48.83 mL/min — ABNORMAL LOW (ref >60.00)
Glucose, Bld: 92 mg/dL (ref 70–99)
Potassium: 4.1 meq/L (ref 3.5–5.1)
Sodium: 138 meq/L (ref 135–145)

## 2019-08-24 ENCOUNTER — Other Ambulatory Visit: Payer: Self-pay | Admitting: Family Medicine

## 2019-08-24 DIAGNOSIS — R0989 Other specified symptoms and signs involving the circulatory and respiratory systems: Secondary | ICD-10-CM

## 2019-08-24 DIAGNOSIS — R609 Edema, unspecified: Secondary | ICD-10-CM

## 2019-08-24 DIAGNOSIS — I10 Essential (primary) hypertension: Secondary | ICD-10-CM

## 2019-08-27 ENCOUNTER — Ambulatory Visit: Payer: Medicare HMO

## 2019-09-04 ENCOUNTER — Ambulatory Visit: Payer: Medicare HMO

## 2019-09-11 ENCOUNTER — Other Ambulatory Visit: Payer: Self-pay | Admitting: Family Medicine

## 2019-09-11 DIAGNOSIS — I10 Essential (primary) hypertension: Secondary | ICD-10-CM

## 2019-09-13 ENCOUNTER — Ambulatory Visit: Payer: Medicare HMO

## 2019-09-22 ENCOUNTER — Other Ambulatory Visit: Payer: Self-pay | Admitting: Family Medicine

## 2019-09-22 DIAGNOSIS — R609 Edema, unspecified: Secondary | ICD-10-CM

## 2019-09-22 DIAGNOSIS — R0989 Other specified symptoms and signs involving the circulatory and respiratory systems: Secondary | ICD-10-CM

## 2019-09-22 DIAGNOSIS — I10 Essential (primary) hypertension: Secondary | ICD-10-CM

## 2019-10-12 ENCOUNTER — Other Ambulatory Visit: Payer: Self-pay | Admitting: Family Medicine

## 2019-10-12 DIAGNOSIS — I10 Essential (primary) hypertension: Secondary | ICD-10-CM

## 2019-10-13 NOTE — Progress Notes (Signed)
Nurse connected with patient 10/14/19 at  1:45 PM EDT by a telephone enabled telemedicine application and verified that I am speaking with the correct person using two identifiers. Patient stated full name and DOB. Patient gave permission to continue with virtual visit. Patient's location was at home and Nurse's location was at [place] office.   Subjective:   [name] is a 86 y.o. male who presents for an Initial Medicare Annual Wellness Visit.  Review of Systems Home Safety/Smoke Alarms: Feels safe in home. Smoke alarms in place.  Lives w/ dtr  In 2 story home. Stays on 1 level. Uses a cane.   Male:   PSA- No results found for: PSA     Objective:    Advanced Directives 10/14/2019 07/07/2018  Does Patient Have a Medical Advance Directive? No Yes  Type of Advance Directive - Living will  Would patient like information on creating a medical advance directive? No - Patient declined -    Current Medications (verified) Outpatient Encounter Medications as of 10/14/2019  Medication Sig  . acetaminophen (TYLENOL) 500 MG tablet Take 500 mg by mouth every 6 (six) hours as needed.  [name] amLODipine (NORVASC) 10 MG tablet Take 1 tablet (10 mg total) by mouth daily.  . Cholecalciferol (VITAMIN D3) 2000 units TABS Take 1 tablet by mouth daily.  . cloNIDine (CATAPRES) 0.1 MG tablet Take 1 tablet by mouth twice daily  . clopidogrel (PLAVIX) 75 MG tablet Take 1 tablet (75 mg total) by mouth daily.  . furosemide (LASIX) 20 MG tablet Take 1 tablet by mouth once daily  . gabapentin (NEURONTIN) 300 MG capsule Take 1 capsule (300 mg total) by mouth 3 (three) times daily.  . simvastatin (ZOCOR) 10 MG tablet Take 1 tablet (10 mg total) by mouth at bedtime.  . vitamin B-12 (CYANOCOBALAMIN) 500 MCG tablet Take 500 mcg by mouth daily.  . vitamin C (ASCORBIC ACID) 500 MG tablet Take 500 mg by mouth daily.   No facility-administered encounter medications on file as of 10/14/2019.    Allergies  (verified) Codeine and Penicillins   History: Past Medical History:  Diagnosis Date  . Branch retinal vein occlusion of left eye   . High cholesterol   . Hypertension   . TIA (transient ischemic attack)    Past Surgical History:  Procedure Laterality Date  . CAROTID ENDARTERECTOMY Right   . HERNIA REPAIR    . INNER EAR SURGERY Right    for hearing loss   Family History  Problem Relation Age of Onset  . Lung cancer Father    Social History   Socioeconomic History  . Marital status: Widowed    Spouse name: Not on file  . Number of children: 4  . Years of education: Not on file  . 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 Smoking status: Never Smoker  . Smokeless tobacco: Never Used  Substance and Sexual Activity  . Alcohol use: Yes    Comment: 1 glass wine  . Drug use: Never  . Sexual activity: Not on file  Other Topics Concern  . Not on file  Social History Narrative   Lives alone   Social Determinants of Health   Financial Resource Strain: Low Risk   . Difficulty of Paying Living Expenses: Not hard at all  Food Insecurity: No Food Insecurity  . Worried About Charity fundraiser in the Last Year:  Never true  . Ran Out of Food in the Last Year: Never true  Transportation Needs: No Transportation Needs  . Lack of Transportation (Medical): No  . Lack of Transportation (Non-Medical): No  Physical Activity:   . Days of Exercise per Week:   . Minutes of Exercise per Session:   Stress:   . Feeling of Stress :   Social Connections:   . Frequency of Communication with Friends and Family:   . Frequency of Social Gatherings with Friends and Family:   . Attends Religious Services:   . Active Member of Clubs or Organizations:   . Attends Archivist Meetings:   [name] Marital Status:    Tobacco Counseling Counseling given: Not Answered   Clinical Intake: Pain : No/denies  pain    Activities of Daily Living In your present state of health, do you have any difficulty performing the following activities: 10/14/2019  Hearing? 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  Vision? N  Difficulty concentrating or making decisions? N  Walking or climbing stairs? N  Dressing or bathing? N  Doing errands, shopping? N  Preparing Food and eating ? N  Using the Toilet? N  In the past six months, have you accidently leaked urine? N  Do you have problems with loss of bowel control? N  Managing your Medications? N  Managing your Finances? N  Housekeeping or managing your Housekeeping? N  Some recent data might be hidden     Immunizations and Health Maintenance Immunization History  Administered Date(s) Administered  . Fluad Quad(high Dose 65+) 04/15/2019  . Influenza Whole 06/19/2007  . Influenza, High Dose Seasonal PF 05/02/2018  . Influenza-Unspecified 05/13/2017  . Pneumococcal Conjugate-13 07/31/2015  . Pneumococcal Polysaccharide-23 07/31/2015, 10/24/2016   There are no preventive care reminders to display for this patient.  Patient Care Team: Libby Maw, MD as PCP - General (Family Medicine)  Indicate any recent Medical Services you may have received from other than [facility] providers in the past year (date may be approximate).    Assessment:   This is a routine wellness examination for [facility]. Physical assessment deferred to PCP.  Hearing/Vision screen Unable to assess. This visit is enabled though telemedicine due to Covid 19.   Dietary issues and exercise activities discussed: Current Exercise Habits: The patient does not participate in regular exercise at present, Exercise limited by: None identified Diet (meal preparation, eat out, water intake, caffeinated beverages, dairy products, fruits and vegetables): well balanced  Goals    . Maintian healthy lifestyle and independence      Depression Screen PHQ 2/9 Scores 10/14/2019  07/27/2019  PHQ - 2 Score 0 0    Fall Risk Fall Risk  10/14/2019 07/27/2019  Falls in the past year? 0 0  Number falls in past yr: 0 -  Injury with Fall? 0 -  Follow up Education provided;Falls prevention discussed -    Cognitive Function: Ad8 score reviewed for issues:  Issues making decisions:no  Less interest in hobbies / activities:no  Repeats questions, stories (family complaining):no  Trouble using ordinary gadgets (microwave, computer, phone):no  Forgets the month or year: no  Mismanaging finances: no  Remembering appts:no  Daily problems with thinking and/or memory:no Ad8 score is=0        Screening Tests Health Maintenance  Topic Date Due  . TETANUS/TDAP  07/22/2028 (Originally 10/09/1946)  . INFLUENZA VACCINE  Completed  . PNA vac Low Risk Adult  Completed  Plan:    Please schedule your next medicare wellness visit with me in 1 yr.  Continue to eat heart healthy diet (full of fruits, vegetables, whole grains, lean protein, water--limit salt, fat, and sugar intake) and increase physical activity as tolerated.  Continue doing brain stimulating activities (puzzles, reading, adult coloring books, staying active) to keep memory sharp.    I have personally reviewed and noted the following in the patient's chart:   . Medical and social history . Use of alcohol, tobacco or illicit drugs  . Current medications and supplements . Functional ability and status . Nutritional status . Physical activity . Advanced directives . List of other physicians . Hospitalizations, surgeries, and ER visits in previous 12 months . Vitals . Screenings to include cognitive, depression, and falls . Referrals and appointments  In addition, I have reviewed and discussed with patient certain preventive protocols, quality metrics, and best practice recommendations. A written personalized care plan for preventive services as well as general preventive health recommendations  were provided to patient.     Shela Nevin, [place]   10/14/2019

## 2019-10-14 ENCOUNTER — Other Ambulatory Visit: Payer: Self-pay

## 2019-10-14 ENCOUNTER — Encounter: Payer: Self-pay | Admitting: *Deleted

## 2019-10-14 ENCOUNTER — Ambulatory Visit: Payer: Medicare HMO | Admitting: *Deleted

## 2019-10-14 DIAGNOSIS — Z0000 Encounter for general adult medical examination without abnormal findings: Secondary | ICD-10-CM

## 2019-10-14 NOTE — Patient Instructions (Signed)
Please schedule your next medicare wellness visit with me in 1 yr.  Continue to eat heart healthy diet (full of fruits, vegetables, whole grains, lean protein, water--limit salt, fat, and sugar intake) and increase physical activity as tolerated.  Continue doing brain stimulating activities (puzzles, reading, adult coloring books, staying active) to keep memory sharp.    Mr. [name] , Thank you for taking time to come for your Medicare Wellness Visit. I appreciate your ongoing commitment to your health goals. Please review the following plan we discussed and let me know if I can assist you in the future.   These are the goals we discussed: Goals    . Maintian healthy lifestyle and independence       This is a list of the screening recommended for you and due dates:  Health Maintenance  Topic Date Due  . Tetanus Vaccine  07/22/2028*  . Flu Shot  Completed  . Pneumonia vaccines  Completed  *Topic was postponed. The date shown is not the original due date.    Preventive Care 29 Years and Older, Male Preventive care refers to lifestyle choices and visits with your health care provider that can promote health and wellness. This includes:  A yearly physical exam. This is also called an annual well check.  Regular dental and eye exams.  Immunizations.  Screening for certain conditions.  Healthy lifestyle choices, such as diet and exercise. What can I expect for my preventive care visit? Physical exam Your health care provider will check:  Height and weight. These may be used to calculate body mass index (BMI), which is a measurement that tells if you are at a healthy weight.  Heart rate and blood pressure.  Your skin for abnormal spots. Counseling Your health care provider may ask you questions about:  Alcohol, tobacco, and drug use.  Emotional well-being.  Home and relationship well-being.  Sexual activity.  Eating habits.  History of falls.  Memory and ability  to understand (cognition).  Work and work Statistician. What immunizations do I need?  Influenza (flu) vaccine  This is recommended every year. Tetanus, diphtheria, and pertussis (Tdap) vaccine  You may need a Td booster every 10 years. Varicella (chickenpox) vaccine  You may need this vaccine if you have not already been vaccinated. Zoster (shingles) vaccine  You may need this after age 110. Pneumococcal conjugate (PCV13) vaccine  One dose is recommended after age 60. Pneumococcal polysaccharide (PPSV23) vaccine  One dose is recommended after age 8. Measles, mumps, and rubella (MMR) vaccine  You may need at least one dose of MMR if you were born in 1957 or later. You may also need a second dose. Meningococcal conjugate (MenACWY) vaccine  You may need this if you have certain conditions. Hepatitis A vaccine  You may need this if you have certain conditions or if you travel or work in places where you may be exposed to hepatitis A. Hepatitis B vaccine  You may need this if you have certain conditions or if you travel or work in places where you may be exposed to hepatitis B. Haemophilus influenzae type b (Hib) vaccine  You may need this if you have certain conditions. You may receive vaccines as individual doses or as more than one vaccine together in one shot (combination vaccines). Talk with your health care provider about the risks and benefits of combination vaccines. What tests do I need? Blood tests  Lipid and cholesterol levels. These may be checked every 5 years, or  more frequently depending on your overall health.  Hepatitis C test.  Hepatitis B test. Screening  Lung cancer screening. You may have this screening every year starting at age 84 if you have a 30-pack-year history of smoking and currently smoke or have quit within the past 15 years.  Colorectal cancer screening. All adults should have this screening starting at age 84 and continuing until age 84.  Your health care provider may recommend screening at age 47 if you are at increased risk. You will have tests every 1-10 years, depending on your results and the type of screening test.  Prostate cancer screening. Recommendations will vary depending on your family history and other risks.  Diabetes screening. This is done by checking your blood sugar (glucose) after you have not eaten for a while (fasting). You may have this done every 1-3 years.  Abdominal aortic aneurysm (AAA) screening. You may need this if you are a current or former smoker.  Sexually transmitted disease (STD) testing. Follow these instructions at home: Eating and drinking  Eat a diet that includes fresh fruits and vegetables, whole grains, lean protein, and low-fat dairy products. Limit your intake of foods with high amounts of sugar, saturated fats, and salt.  Take vitamin and mineral supplements as recommended by your health care provider.  Do not drink alcohol if your health care provider tells you not to drink.  If you drink alcohol: ? Limit how much you have to 0-2 drinks a day. ? Be aware of how much alcohol is in your drink. In the [place] one drink equals one 12 oz bottle of beer (355 mL), one 5 oz glass of wine (148 mL), or one 1 oz glass of hard liquor (44 mL). Lifestyle  Take daily care of your teeth and gums.  Stay active. Exercise for at least 30 minutes on 5 or more days each week.  Do not use any products that contain nicotine or tobacco, such as cigarettes, e-cigarettes, and chewing tobacco. If you need help quitting, ask your health care provider.  If you are sexually active, practice safe sex. Use a condom or other form of protection to prevent STIs (sexually transmitted infections).  Talk with your health care provider about taking a low-dose aspirin or statin. What's next?  Visit your health care provider once a year for a well check visit.  Ask your health care provider how often you  should have your eyes and teeth checked.  Stay up to date on all vaccines. This information is not intended to replace advice given to you by your health care provider. Make sure you discuss any questions you have with your health care provider. Document Revised: 07/10/2018 Document Reviewed: 07/10/2018 Elsevier Patient Education  2020 [employer].

## 2019-10-26 ENCOUNTER — Other Ambulatory Visit: Payer: Self-pay

## 2019-10-26 ENCOUNTER — Ambulatory Visit (INDEPENDENT_AMBULATORY_CARE_PROVIDER_SITE_OTHER): Payer: Medicare HMO | Admitting: Family Medicine

## 2019-10-26 ENCOUNTER — Encounter: Payer: Self-pay | Admitting: Family Medicine

## 2019-10-26 VITALS — BP 158/72 | HR 55 | Temp 97.6°F | Ht 70.0 in | Wt 203.8 lb

## 2019-10-26 DIAGNOSIS — I10 Essential (primary) hypertension: Secondary | ICD-10-CM | POA: Diagnosis not present

## 2019-10-26 DIAGNOSIS — N1831 Chronic kidney disease, stage 3a: Secondary | ICD-10-CM

## 2019-10-26 DIAGNOSIS — E7800 Pure hypercholesterolemia, unspecified: Secondary | ICD-10-CM | POA: Diagnosis not present

## 2019-10-26 DIAGNOSIS — R609 Edema, unspecified: Secondary | ICD-10-CM

## 2019-10-26 LAB — LIPID PANEL
Cholesterol: 133 mg/dL (ref 0–200)
HDL: 34.8 mg/dL — ABNORMAL LOW (ref >39.00)
LDL Cholesterol: 74 mg/dL (ref 0–99)
NonHDL: 97.91
Total CHOL/HDL Ratio: 4
Triglycerides: 120 mg/dL (ref 0.0–149.0)
VLDL: 24 mg/dL (ref 0.0–40.0)

## 2019-10-26 LAB — BASIC METABOLIC PANEL
BUN: 23 mg/dL (ref 6–23)
CO2: 25 mEq/L (ref 19–32)
Calcium: 8.9 mg/dL (ref 8.4–10.5)
Chloride: 104 mEq/L (ref 96–112)
Creatinine, Ser: 1.17 mg/dL (ref 0.40–1.50)
GFR: 58.56 mL/min — ABNORMAL LOW (ref >60.00)
Glucose, Bld: 104 mg/dL — ABNORMAL HIGH (ref 70–99)
Potassium: 4.4 mEq/L (ref 3.5–5.1)
Sodium: 138 mEq/L (ref 135–145)

## 2019-10-26 MED ORDER — CLONIDINE HCL 0.2 MG PO TABS: 0.2000 mg | ORAL_TABLET | Freq: Two times a day (BID) | ORAL | 2 refills | Status: DC

## 2019-10-26 NOTE — Progress Notes (Signed)
Established Patient Office Visit  Subjective:  Patient ID: [name], male    DOB: 08-May-1928  Age: 86 y.o. MRN: [id number]  CC:  Chief Complaint  Patient presents with  . Follow-up    3 month follow up on BP, no concerns.     HPI [name] presents for follow-up of his blood pressure and elevated cholesterol.  Blood pressure has been running in the upper 140s over upper 80s.  Tolerating medicines well.  Denies shortness of breath or chest pain.  Swelling in his lower extremities is controlled with Lasix and compression stockings.  Having some pain in his knees and taking Tylenol as needed.  Past Medical History:  Diagnosis Date  . Branch retinal vein occlusion of left eye   . High cholesterol   . Hypertension   . TIA (transient ischemic attack)     Past Surgical History:  Procedure Laterality Date  . CAROTID ENDARTERECTOMY Right   . HERNIA REPAIR    . INNER EAR SURGERY Right    for hearing loss    Family History  Problem Relation Age of Onset  . Lung cancer Father     Social History   Socioeconomic History  . Marital status: Widowed    Spouse name: Not on file  . Number of children: 4  . Years of education: Not on file  . 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 Smoking status: Never Smoker  . Smokeless tobacco: Never Used  Substance and Sexual Activity  . Alcohol use: Yes    Comment: 1 glass wine  . Drug use: Never  . Sexual activity: Not on file  Other Topics Concern  . Not on file  Social History Narrative   Lives alone   Social Determinants of Health   Financial Resource Strain: Low Risk   . Difficulty of Paying Living Expenses: Not hard at all  Food Insecurity: No Food Insecurity  . Worried About Charity fundraiser in the Last Year: Never true  . Ran Out of Food in the Last Year: Never true  Transportation Needs: No Transportation Needs  . Lack  of Transportation (Medical): No  . Lack of Transportation (Non-Medical): No  Physical Activity:   . Days of Exercise per Week:   . Minutes of Exercise per Session:   Stress:   . Feeling of Stress :   Social Connections:   . Frequency of Communication with Friends and Family:   . Frequency of Social Gatherings with Friends and Family:   . Attends Religious Services:   . Active Member of Clubs or Organizations:   . Attends Archivist Meetings:   [name] Marital Status:   Intimate Partner Violence:   . Fear of Current or Ex-Partner:   . Emotionally Abused:   [name] Physically Abused:   . Sexually Abused:     Outpatient Medications Prior to Visit  Medication Sig Dispense Refill  . acetaminophen (TYLENOL) 500 MG tablet Take 500 mg by mouth every 6 (six) hours as needed.    [name] amLODipine (NORVASC) 10 MG tablet Take 1 tablet (10 mg total) by mouth daily. 90 tablet 1  . Cholecalciferol (VITAMIN D3) 2000 units TABS Take 1 tablet by mouth daily.    . clopidogrel (PLAVIX) 75 MG tablet Take 1 tablet (75 mg total) by mouth daily. 90 tablet 1  . furosemide (LASIX) 20 MG tablet  Take 1 tablet by mouth once daily 30 tablet 1  . gabapentin (NEURONTIN) 300 MG capsule Take 1 capsule (300 mg total) by mouth 3 (three) times daily. 90 capsule 2  . simvastatin (ZOCOR) 10 MG tablet Take 1 tablet (10 mg total) by mouth at bedtime. 90 tablet 3  . vitamin B-12 (CYANOCOBALAMIN) 500 MCG tablet Take 500 mcg by mouth daily.    . vitamin C (ASCORBIC ACID) 500 MG tablet Take 500 mg by mouth daily.    . cloNIDine (CATAPRES) 0.1 MG tablet Take 1 tablet by mouth twice daily 180 tablet 1   No facility-administered medications prior to visit.    Allergies  Allergen Reactions  . Codeine Other (See Comments)    No reaction noted  . Penicillins     REACTION: rash    ROS Review of Systems  Constitutional: Negative.   HENT: Negative.   Respiratory: Negative.  Negative for chest tightness and shortness of breath.    Cardiovascular: Negative for chest pain and palpitations.  Gastrointestinal: Negative.   Musculoskeletal: Positive for arthralgias and gait problem.  Neurological: Positive for numbness.  Hematological: Does not bruise/bleed easily.  Psychiatric/Behavioral: Negative.       Objective:    Physical Exam  Constitutional: He is oriented to person, place, and time. He appears well-developed and well-nourished. No distress.  HENT:  Head: Normocephalic and atraumatic.  Right Ear: External ear normal.  Left Ear: External ear normal.  Eyes: Conjunctivae are normal. Right eye exhibits no discharge. Left eye exhibits no discharge. No scleral icterus.  Neck: No JVD present. No tracheal deviation present.  Cardiovascular: Normal rate, regular rhythm and normal heart sounds.  Pulmonary/Chest: Effort normal and breath sounds normal. No stridor.  Musculoskeletal:        General: No edema.  Neurological: He is alert and oriented to person, place, and time.  Skin: Skin is warm and dry. He is not diaphoretic.  Psychiatric: He has a normal mood and affect. His behavior is normal.    BP (!) 158/72   Pulse (!) 55   Temp 97.6 F (36.4 C) (Tympanic)   Ht 5\' 10"  (1.778 m)   Wt 203 lb 12.8 oz (92.4 kg)   SpO2 96%   BMI 29.24 kg/m  Wt Readings from Last 3 Encounters:  10/26/19 203 lb 12.8 oz (92.4 kg)  07/27/19 206 lb (93.4 kg)  05/15/19 204 lb (92.5 kg)     There are no preventive care reminders to display for this patient.  There are no preventive care reminders to display for this patient.  Lab Results  Component Value Date   TSH 2.650 04/04/2018   Lab Results  Component Value Date   WBC 7.2 05/15/2019   HGB 14.4 05/15/2019   HCT 44.8 05/15/2019   MCV 85.9 05/15/2019   PLT 231.0 05/15/2019   Lab Results  Component Value Date   NA 138 08/11/2019   K 4.1 08/11/2019   CO2 28 08/11/2019   GLUCOSE 92 08/11/2019   BUN 23 08/11/2019   CREATININE 1.37 08/11/2019   BILITOT 0.6  05/15/2019   ALKPHOS 112 05/15/2019   AST 13 05/15/2019   ALT 9 05/15/2019   PROT 6.7 05/15/2019   ALBUMIN 4.0 05/15/2019   CALCIUM 9.2 08/11/2019   GFR 48.83 (L) 08/11/2019   Lab Results  Component Value Date   CHOL 134 12/10/2017   Lab Results  Component Value Date   HDL 36.00 (L) 12/10/2017   Lab Results  Component  Value Date   LDLCALC 75 12/10/2017   Lab Results  Component Value Date   TRIG 114.0 12/10/2017   Lab Results  Component Value Date   CHOLHDL 4 12/10/2017   No results found for: HGBA1C    Assessment & Plan:   Problem List Items Addressed This Visit      Cardiovascular and Mediastinum   Essential hypertension - Primary   Relevant Medications   cloNIDine (CATAPRES) 0.2 MG tablet   Other Relevant Orders   Basic metabolic panel     Genitourinary   Stage 3a chronic kidney disease   Relevant Orders   Basic metabolic panel     Other   Elevated cholesterol   Relevant Medications   cloNIDine (CATAPRES) 0.2 MG tablet   Other Relevant Orders   Lipid panel   Edema   Relevant Orders   Basic metabolic panel      Meds ordered this encounter  Medications  . cloNIDine (CATAPRES) 0.2 MG tablet    Sig: Take 1 tablet (0.2 mg total) by mouth 2 (two) times daily.    Dispense:  60 tablet    Refill:  2    Follow-up: Return in about 1 month (around 11/26/2019).   Have increased clonidine to 0.2 mg twice daily.  Follow-up in 1 month.  May need to increase Zocor to 20.  Suggested that he take Tylenol every 8 hours on a scheduled basis. Libby Maw, MD

## 2019-10-29 ENCOUNTER — Encounter: Payer: Self-pay | Admitting: Family Medicine

## 2019-11-02 ENCOUNTER — Other Ambulatory Visit: Payer: Self-pay | Admitting: Family Medicine

## 2019-11-02 ENCOUNTER — Encounter: Payer: Self-pay | Admitting: Family Medicine

## 2019-11-02 ENCOUNTER — Telehealth: Payer: Self-pay | Admitting: Family Medicine

## 2019-11-02 DIAGNOSIS — G629 Polyneuropathy, unspecified: Secondary | ICD-10-CM

## 2019-11-02 NOTE — Telephone Encounter (Signed)
Patient is calling and wanted to speak to someone regarding a note. CB is [phone]

## 2019-11-05 ENCOUNTER — Encounter: Payer: Self-pay | Admitting: Family Medicine

## 2019-11-06 NOTE — Telephone Encounter (Signed)
[place], please type the letter as he requested. We will do it this one time. For further requests she needs to see her doctor for this issue.

## 2019-11-09 ENCOUNTER — Encounter: Payer: Self-pay | Admitting: Family Medicine

## 2019-11-10 ENCOUNTER — Other Ambulatory Visit: Payer: Self-pay | Admitting: Family Medicine

## 2019-11-10 DIAGNOSIS — R0989 Other specified symptoms and signs involving the circulatory and respiratory systems: Secondary | ICD-10-CM

## 2019-11-10 DIAGNOSIS — I10 Essential (primary) hypertension: Secondary | ICD-10-CM

## 2019-11-10 DIAGNOSIS — R609 Edema, unspecified: Secondary | ICD-10-CM

## 2019-11-10 NOTE — Telephone Encounter (Signed)
Last oV 10/26/19 Last fill for Amlodipine 05/05/19  #90/1 Last fill for Furosemide 09/22/19  #30/1

## 2019-11-25 ENCOUNTER — Other Ambulatory Visit: Payer: Self-pay

## 2019-11-25 ENCOUNTER — Telehealth: Payer: Self-pay | Admitting: Family Medicine

## 2019-11-25 DIAGNOSIS — I6523 Occlusion and stenosis of bilateral carotid arteries: Secondary | ICD-10-CM

## 2019-11-25 DIAGNOSIS — R001 Bradycardia, unspecified: Secondary | ICD-10-CM

## 2019-11-25 DIAGNOSIS — I10 Essential (primary) hypertension: Secondary | ICD-10-CM

## 2019-11-25 NOTE — Progress Notes (Signed)
°  Chronic Care Management   Note  11/25/2019 Name: [name] MRN: [id number] DOB: 14-Apr-1928  [name] is a 86 y.o. year old male who is a primary care patient of Libby Maw, MD. I reached out to [name] by phone today in response to a referral sent by [name] PCP, Libby Maw, MD.   [name] was given information about Chronic Care Management services today including:  1. CCM service includes personalized support from designated clinical staff supervised by his physician, including individualized plan of care and coordination with other care providers 2. 24/7 contact phone numbers for assistance for urgent and routine care needs. 3. Service will only be billed when office clinical staff spend 20 minutes or more in a month to coordinate care. 4. Only one practitioner may furnish and bill the service in a calendar month. 5. The patient may stop CCM services at any time (effective at the end of the month) by phone call to the office staff.   Patient agreed to services and verbal consent obtained.   This note is not being shared with the patient for the following reason: To respect privacy (The patient or proxy has requested that the information not be shared).  Follow up plan:   Earney Hamburg Upstream Scheduler

## 2019-11-26 ENCOUNTER — Encounter: Payer: Self-pay | Admitting: Family Medicine

## 2019-11-26 ENCOUNTER — Ambulatory Visit (INDEPENDENT_AMBULATORY_CARE_PROVIDER_SITE_OTHER): Payer: Medicare HMO | Admitting: Family Medicine

## 2019-11-26 VITALS — BP 162/80 | HR 88 | Temp 97.9°F | Ht 70.0 in | Wt 203.0 lb

## 2019-11-26 DIAGNOSIS — R9389 Abnormal findings on diagnostic imaging of other specified body structures: Secondary | ICD-10-CM

## 2019-11-26 DIAGNOSIS — I10 Essential (primary) hypertension: Secondary | ICD-10-CM

## 2019-11-26 MED ORDER — CLONIDINE HCL 0.1 MG PO TABS: 0.1000 mg | ORAL_TABLET | Freq: Two times a day (BID) | ORAL | 3 refills | Status: DC

## 2019-11-26 MED ORDER — FUROSEMIDE 40 MG PO TABS: 40.0000 mg | ORAL_TABLET | Freq: Every day | ORAL | 3 refills | Status: DC

## 2019-11-26 NOTE — Progress Notes (Signed)
Established Patient Office Visit  Subjective:  Patient ID: [name], male    DOB: [birth year]  Age: 86 y.o. MRN: [id number]  CC:  Chief Complaint  Patient presents with  . Follow-up    1 month follow up on BP, pt fasting this morning.     HPI [name] presents for follow-up of his blood pressure.  Pressure at home is running in the 150s over 80-90 range.  He was unable to tolerate the point to milligrams of clonidine twice daily.  It led to a dry mouth.  Feels that blood pressure is up today because he had to walk back out to his car.  Does not seem to have the same exercise stamina and energy that he used to have.  Sleeps on one pillow.  Normal echocardiogram 2019.  Abnormal CT scan back in October and is due for follow-up.  Past Medical History:  Diagnosis Date  . Branch retinal vein occlusion of left eye   . High cholesterol   . Hypertension   . TIA (transient ischemic attack)     Past Surgical History:  Procedure Laterality Date  . CAROTID ENDARTERECTOMY Right   . HERNIA REPAIR    . INNER EAR SURGERY Right    for hearing loss    Family History  Problem Relation Age of Onset  . Lung cancer Father     Social History   Socioeconomic History  . Marital status: Widowed    Spouse name: Not on file  . Number of children: 4  . Years of education: Not on file  . 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 Smoking status: Never Smoker  . Smokeless tobacco: Never Used  Substance and Sexual Activity  . Alcohol use: Yes    Comment: 1 glass wine  . Drug use: Never  . Sexual activity: Not on file  Other Topics Concern  . Not on file  Social History Narrative   Lives alone   Social Determinants of Health   Financial Resource Strain: Low Risk   . Difficulty of Paying Living Expenses: Not hard at all  Food Insecurity: No Food Insecurity  . Worried About Sales executive in the Last Year: Never true  . Ran Out of Food in the Last Year: Never true  Transportation Needs: No Transportation Needs  . Lack of Transportation (Medical): No  . Lack of Transportation (Non-Medical): No  Physical Activity:   . Days of Exercise per Week:   . Minutes of Exercise per Session:   Stress:   . Feeling of Stress :   Social Connections:   . Frequency of Communication with Friends and Family:   . Frequency of Social Gatherings with Friends and Family:   . Attends Religious Services:   . Active Member of Clubs or Organizations:   . Attends Archivist Meetings:   [name] Marital Status:   Intimate Partner Violence:   . Fear of Current or Ex-Partner:   . Emotionally Abused:   [name] Physically Abused:   . Sexually Abused:     Outpatient Medications Prior to Visit  Medication Sig Dispense Refill  . acetaminophen (TYLENOL) 500 MG tablet Take 500 mg by mouth every 6 (six) hours as needed.    [name] amLODipine (NORVASC) 10 MG tablet Take 1 tablet by mouth once daily 90 tablet 0  . Cholecalciferol (VITAMIN D3) 2000  units TABS Take 1 tablet by mouth daily.    . clopidogrel (PLAVIX) 75 MG tablet Take 1 tablet (75 mg total) by mouth daily. 90 tablet 1  . gabapentin (NEURONTIN) 300 MG capsule TAKE 1 CAPSULE BY MOUTH THREE TIMES DAILY 90 capsule 0  . simvastatin (ZOCOR) 10 MG tablet Take 1 tablet (10 mg total) by mouth at bedtime. 90 tablet 3  . vitamin B-12 (CYANOCOBALAMIN) 500 MCG tablet Take 500 mcg by mouth daily.    . vitamin C (ASCORBIC ACID) 500 MG tablet Take 500 mg by mouth daily.    . cloNIDine (CATAPRES) 0.2 MG tablet Take 1 tablet (0.2 mg total) by mouth 2 (two) times daily. 60 tablet 2  . furosemide (LASIX) 20 MG tablet Take 1 tablet by mouth once daily 30 tablet 0   No facility-administered medications prior to visit.    Allergies  Allergen Reactions  . Codeine Other (See Comments)    No reaction noted  . Penicillins     REACTION: rash    ROS Review of  Systems  Constitutional: Negative.   HENT: Negative.   Eyes: Negative for photophobia and visual disturbance.  Respiratory: Positive for shortness of breath. Negative for chest tightness and wheezing.   Cardiovascular: Negative for chest pain and palpitations.  Gastrointestinal: Negative.   Endocrine: Negative for polyphagia and polyuria.  Genitourinary: Negative.   Musculoskeletal: Negative.   Allergic/Immunologic: Negative for immunocompromised state.  Neurological: Negative for tremors and speech difficulty.  Hematological: Does not bruise/bleed easily.  Psychiatric/Behavioral: Negative.       Objective:    Physical Exam  Constitutional: He appears well-developed and well-nourished. No distress.  HENT:  Head: Normocephalic and atraumatic.  Right Ear: External ear normal.  Left Ear: External ear normal.  Eyes: Conjunctivae are normal. Right eye exhibits no discharge. Left eye exhibits no discharge. No scleral icterus.  Neck: No JVD present. No tracheal deviation present. No thyromegaly present.  Cardiovascular: Normal rate, regular rhythm and normal heart sounds.  Pulmonary/Chest: Effort normal. No stridor. He has no decreased breath sounds. He has no wheezes. He has rhonchi in the right lower field and the left lower field.  Musculoskeletal:     Right lower leg: No edema.     Left lower leg: No edema.  Lymphadenopathy:    He has no cervical adenopathy.  Skin: He is not diaphoretic.    Pulse 88   Temp 97.9 F (36.6 C) (Tympanic)   Ht 5\' 10"  (1.778 m)   Wt 203 lb (92.1 kg)   SpO2 96%   BMI 29.13 kg/m  Wt Readings from Last 3 Encounters:  11/26/19 203 lb (92.1 kg)  10/26/19 203 lb 12.8 oz (92.4 kg)  07/27/19 206 lb (93.4 kg)     Health Maintenance Due  Topic Date Due  . COVID-19 Vaccine (1) Never done    There are no preventive care reminders to display for this patient.  Lab Results  Component Value Date   TSH 2.650 04/04/2018   Lab Results  Component  Value Date   WBC 7.2 05/15/2019   HGB 14.4 05/15/2019   HCT 44.8 05/15/2019   MCV 85.9 05/15/2019   PLT 231.0 05/15/2019   Lab Results  Component Value Date   NA 138 10/26/2019   K 4.4 10/26/2019   CO2 25 10/26/2019   GLUCOSE 104 (H) 10/26/2019   BUN 23 10/26/2019   CREATININE 1.17 10/26/2019   BILITOT 0.6 05/15/2019   ALKPHOS 112 05/15/2019  AST 13 05/15/2019   ALT 9 05/15/2019   PROT 6.7 05/15/2019   ALBUMIN 4.0 05/15/2019   CALCIUM 8.9 10/26/2019   GFR 58.56 (L) 10/26/2019   Lab Results  Component Value Date   CHOL 133 10/26/2019   Lab Results  Component Value Date   HDL 34.80 (L) 10/26/2019   Lab Results  Component Value Date   LDLCALC 74 10/26/2019   Lab Results  Component Value Date   TRIG 120.0 10/26/2019   Lab Results  Component Value Date   CHOLHDL 4 10/26/2019   No results found for: HGBA1C    Assessment & Plan:   Problem List Items Addressed This Visit      Cardiovascular and Mediastinum   Essential hypertension - Primary   Relevant Medications   furosemide (LASIX) 40 MG tablet   cloNIDine (CATAPRES) 0.1 MG tablet     Other   Abnormal CT of the chest   Relevant Orders   CT CHEST NODULE FOLLOW UP LOW DOSE W/O      Meds ordered this encounter  Medications  . furosemide (LASIX) 40 MG tablet    Sig: Take 1 tablet (40 mg total) by mouth daily.    Dispense:  30 tablet    Refill:  3  . cloNIDine (CATAPRES) 0.1 MG tablet    Sig: Take 1 tablet (0.1 mg total) by mouth 2 (two) times daily.    Dispense:  180 tablet    Refill:  3    Follow-up: Return in about 4 weeks (around 12/24/2019), or increase lasix to 40mg . continue norvasc. clonidine .1mg  twice daily.    Libby Maw, MD

## 2019-11-26 NOTE — Addendum Note (Signed)
Addended by: Lynda Rainwater on: 11/26/2019 04:40 PM   Modules accepted: Orders

## 2019-12-02 ENCOUNTER — Other Ambulatory Visit: Payer: Self-pay | Admitting: Family Medicine

## 2019-12-02 DIAGNOSIS — G629 Polyneuropathy, unspecified: Secondary | ICD-10-CM
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
## 2019-12-04 ENCOUNTER — Ambulatory Visit: Payer: Medicare HMO

## 2019-12-04 DIAGNOSIS — E7800 Pure hypercholesterolemia, unspecified: Secondary | ICD-10-CM

## 2019-12-04 DIAGNOSIS — I10 Essential (primary) hypertension: Secondary | ICD-10-CM

## 2019-12-04 NOTE — Patient Instructions (Addendum)
Visit Information It was great speaking with you today!  Please let me know if you have any questions about our visit.  Goals Addressed            This Visit's Progress   . Chronic Care Management       CARE PLAN ENTRY  Current Barriers:  . Chronic Disease Management support, education, and care coordination needs related to Hypertension, Hyperlipidemia, and Chronic Kidney Disease   Hypertension . Pharmacist Clinical Goal(s): o Over the next 30 days, patient will work with PharmD and providers to achieve BP goal <140/90 . Current regimen:  o Amlodipine 10 mg daily  o Clonidine 0.2 mg twice daily  o Furosemide 40 mg daily . Interventions: o Recommend stopping furosemide  o Recommend starting chlorthalidone 12.5 mg daily  o Recommend switching clonidine to 0.2 mg/24hr weekly patch  . Patient self care activities - Over the next 30 days, patient will: o Continue checking blood pressure daily, document, and provide at future appointments o Ensure daily salt intake < 2300 mg/day  Hyperlipidemia . Pharmacist Clinical Goal(s): o Over the next 180 days, patient will work with PharmD and providers to maintain LDL goal less than 100 . Current regimen:  o Simvastatin 10 mg nightly o  Medication management . Pharmacist Clinical Goal(s): o Over the next 90 days, patient will work with PharmD and providers to achieve optimal medication adherence . Current pharmacy: [facility] . Interventions o Comprehensive medication review performed. o Utilize [facility] for medication synchronization, packaging and delivery. Verbal consent obtained for [facility] enhanced pharmacy services (medication synchronization, adherence packaging, delivery coordination). A medication sync plan was created to allow patient to get all medications delivered once every 30 to 90 days per patient preference. Patient understands they have freedom to choose pharmacy and clinical pharmacist will coordinate  care between all prescribers and [facility].  . Patient self care activities - Over the next 90 days, patient will: o Take medications as prescribed o Report any questions or concerns to PharmD and/or provider(s)        Mr. Turpin was given information about Chronic Care Management services today including:  1. CCM service includes personalized support from designated clinical staff supervised by his physician, including individualized plan of care and coordination with other care providers 2. 24/7 contact phone numbers for assistance for urgent and routine care needs. 3. Standard insurance, coinsurance, copays and deductibles apply for chronic care management only during months in which we provide at least 20 minutes of these services. Most insurances cover these services at 100%, however patients may be responsible for any copay, coinsurance and/or deductible if applicable. This service may help you avoid the need for more expensive face-to-face services. 4. Only one practitioner may furnish and bill the service in a calendar month. 5. The patient may stop CCM services at any time (effective at the end of the month) by phone call to the office staff.  Patient agreed to services and verbal consent obtained.   The patient verbalized understanding of instructions provided today and agreed to receive a mailed copy of patient instruction and/or educational materials. Face to Face appointment with pharmacist scheduled for: 01/07/20 at 11:30 AM  [place] at [facility]  [phone]

## 2019-12-15 ENCOUNTER — Telehealth: Payer: Self-pay

## 2019-12-15 NOTE — Telephone Encounter (Signed)
[name],  The patient wishes to use [facility] going forward. To help [place] transition the patient to the pharmacy, can you please put in new refills of his chronic medications so that we may fill them for him the next time they are due?   Amlodipine 10 mg  Clonidine 0.1 mg - Can you clarify what dose Dr. Ethelene Hal wants him to take? Clopidogrel 75 mg  Furosemide 40 mg  Gabapentin 300 mg  Simvastatin 10 mg   Thanks,  [place] at [facility]  [phone]

## 2019-12-21 ENCOUNTER — Telehealth: Payer: Self-pay

## 2019-12-21 DIAGNOSIS — I10 Essential (primary) hypertension: Secondary | ICD-10-CM

## 2019-12-21 MED ORDER — CHLORTHALIDONE 25 MG PO TABS: 25.0000 mg | ORAL_TABLET | Freq: Every day | ORAL | 2 refills | Status: DC

## 2019-12-21 MED ORDER — CLONIDINE 0.2 MG/24HR TD PTWK: 0.2000 mg | MEDICATED_PATCH | TRANSDERMAL | 12 refills | Status: DC

## 2019-12-21 NOTE — Telephone Encounter (Signed)
Alex,  I want to see the patient in the clinic after he starts the new meds.

## 2019-12-21 NOTE — Addendum Note (Signed)
Addended by: Jon Billings on: 12/21/2019 04:35 PM   Modules accepted: Orders

## 2019-12-21 NOTE — Telephone Encounter (Signed)
I'm okay with making changes, as long as I can see him after the changes.

## 2019-12-21 NOTE — Telephone Encounter (Signed)
Spoke with patient regarding plan to switch to clonidine patches, stop furosemide, and start chlorthalidone. Patient was made aware of the increase in price on the clonidine patches and he was ok with this. He also states he has about 2 weeks of gabapentin remaining. Patient verbalized his understanding of the plan and repeated it back to me.  Dr. Ethelene Hal,  Can you please: -Discontinue clonidine tablets  -Discontinue furosemide  -Start clonidine 0.2 mg/weekly patch -Start chlorthalidone 12.5 mg daily?   Thanks,  Dor[place] Section Clinical Pharmacist Velora Heckler at [facility]  [phone]

## 2019-12-21 NOTE — Progress Notes (Signed)
agree

## 2019-12-21 NOTE — Telephone Encounter (Signed)
-----   Message from Libby Maw, MD sent at 12/21/2019 11:50 AM EDT -----   ----- Message ----- From: Germaine Pomfret, [facility] Sent: 12/04/2019  12:44 PM EDT To: Libby Maw, MD  Dr. Ethelene Hal, Patient's blood pressure is elevated despite continuing clonidine 0.2 mg twice weekly and he is having significant dry mouth. Recommend switching to clonidine 0.2 mg/24 hr patch for better tolerability profile if affordable. Recommend stopping furosemide and retrial of Chlorthalidone 12.5 mg daily given improved kidney function over past ~6 monthsRecommend follow-up BMP in 1 week. If you are in agreement with the above changes, please let me know and I will communicate them with the patient. Additionally, he elected to use [facility] going forward, so please send any new prescriptions there. Thanks,Alex

## 2019-12-22 ENCOUNTER — Other Ambulatory Visit: Payer: Self-pay

## 2019-12-22 DIAGNOSIS — I6523 Occlusion and stenosis of bilateral carotid arteries: Secondary | ICD-10-CM

## 2019-12-22 DIAGNOSIS — E7800 Pure hypercholesterolemia, unspecified: Secondary | ICD-10-CM

## 2019-12-22 DIAGNOSIS — G459 Transient cerebral ischemic attack, unspecified: Secondary | ICD-10-CM

## 2019-12-22 DIAGNOSIS — I10 Essential (primary) hypertension: Secondary | ICD-10-CM

## 2019-12-22 DIAGNOSIS — G629 Polyneuropathy, unspecified: Secondary | ICD-10-CM

## 2019-12-22 MED ORDER — CLOPIDOGREL BISULFATE 75 MG PO TABS: 75.0000 mg | ORAL_TABLET | Freq: Every day | ORAL | 1 refills | Status: DC

## 2019-12-22 MED ORDER — GABAPENTIN 300 MG PO CAPS: 300.0000 mg | ORAL_CAPSULE | Freq: Three times a day (TID) | ORAL | 0 refills | Status: DC

## 2019-12-22 MED ORDER — SIMVASTATIN 10 MG PO TABS: 10.0000 mg | ORAL_TABLET | Freq: Every day | ORAL | 3 refills | Status: DC

## 2019-12-22 MED ORDER — AMLODIPINE BESYLATE 10 MG PO TABS: 10.0000 mg | ORAL_TABLET | Freq: Every day | ORAL | 0 refills | Status: DC

## 2019-12-23 ENCOUNTER — Other Ambulatory Visit: Payer: Self-pay

## 2019-12-23 ENCOUNTER — Ambulatory Visit (HOSPITAL_BASED_OUTPATIENT_CLINIC_OR_DEPARTMENT_OTHER)
Admission: RE | Admit: 2019-12-23 | Discharge: 2019-12-23 | Disposition: A | Discharge status: Home / Self Care | Payer: Medicare HMO | Source: Ambulatory Visit | Attending: Family Medicine | Admitting: Family Medicine

## 2019-12-23 DIAGNOSIS — R9389 Abnormal findings on diagnostic imaging of other specified body structures: Secondary | ICD-10-CM

## 2019-12-23 DIAGNOSIS — R590 Localized enlarged lymph nodes: Secondary | ICD-10-CM | POA: Diagnosis not present

## 2019-12-23 DIAGNOSIS — R911 Solitary pulmonary nodule: Secondary | ICD-10-CM | POA: Diagnosis not present

## 2019-12-23 DIAGNOSIS — R918 Other nonspecific abnormal finding of lung field: Secondary | ICD-10-CM | POA: Diagnosis not present

## 2019-12-23 DIAGNOSIS — I700 Atherosclerosis of aorta: Secondary | ICD-10-CM | POA: Diagnosis not present

## 2019-12-24 ENCOUNTER — Ambulatory Visit (INDEPENDENT_AMBULATORY_CARE_PROVIDER_SITE_OTHER): Payer: Medicare HMO | Admitting: Family Medicine

## 2019-12-24 ENCOUNTER — Encounter: Payer: Self-pay | Admitting: Family Medicine

## 2019-12-24 VITALS — BP 158/80 | HR 80 | Temp 98.4°F | Ht 70.0 in | Wt 199.2 lb

## 2019-12-24 DIAGNOSIS — R55 Syncope and collapse: Secondary | ICD-10-CM

## 2019-12-24 DIAGNOSIS — G629 Polyneuropathy, unspecified: Secondary | ICD-10-CM

## 2019-12-24 DIAGNOSIS — I10 Essential (primary) hypertension: Secondary | ICD-10-CM

## 2019-12-24 HISTORY — DX: Syncope and collapse: R55

## 2019-12-24 NOTE — Patient Instructions (Signed)
Health Maintenance Due  Topic Date Due  . COVID-19 Vaccine (1) Never done    Depression screen [facility] 2/9 10/14/2019 07/27/2019  Decreased Interest 0 0  Down, Depressed, Hopeless 0 0  PHQ - 2 Score 0 0

## 2019-12-24 NOTE — Progress Notes (Signed)
Established Patient Office Visit  Subjective:  Patient ID: [name], male    DOB: 17-May-1928  Age: 86 y.o. MRN: [id number]  CC:  Chief Complaint  Patient presents with  . Follow-up    Pt here for 4 month follow up/Bp.  Pt is fasting for labs    HPI [name] presents for follow-up of his blood pressure status post changing to the clonidine patch and chlorthalidone.  Placed the patch 3 days ago.  His dry mouth is improved.  Started the chlorthalidone 2 days ago.  Blood pressures at home have been in the 130 to 150/70-80 range.  He is no longer taking the Lasix.  Of concern he experienced an acute episode of light headedness with a rapid heartbeat 3 days ago.  He laid down and propped his legs up.  Heart rate returned to normal and lightheadedness resolved after about 30 minutes.  Prior history of this but it has not happened in quite some time.  He had a minor accident around his condo complex.  His family no longer wants him to drive and he is okay with that.  He has very little feeling in his feet.  Past Medical History:  Diagnosis Date  . Branch retinal vein occlusion of left eye   . High cholesterol   . Hypertension   . TIA (transient ischemic attack)     Past Surgical History:  Procedure Laterality Date  . CAROTID ENDARTERECTOMY Right   . HERNIA REPAIR    . INNER EAR SURGERY Right    for hearing loss    Family History  Problem Relation Age of Onset  . Lung cancer Father     Social History   Socioeconomic History  . Marital status: Widowed    Spouse name: Not on file  . Number of children: 4  . Years of education: Not on file  . 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 Smoking status: Never Smoker  . Smokeless tobacco: Never Used  Substance and Sexual Activity  . Alcohol use: Yes    Comment: 1 glass wine  . Drug use: Never  . Sexual activity: Not on file  Other  Topics Concern  . Not on file  Social History Narrative   Lives alone   Social Determinants of Health   Financial Resource Strain: Low Risk   . Difficulty of Paying Living Expenses: Not hard at all  Food Insecurity: No Food Insecurity  . Worried About Charity fundraiser in the Last Year: Never true  . Ran Out of Food in the Last Year: Never true  Transportation Needs: No Transportation Needs  . Lack of Transportation (Medical): No  . Lack of Transportation (Non-Medical): No  Physical Activity:   . Days of Exercise per Week:   . Minutes of Exercise per Session:   Stress:   . Feeling of Stress :   Social Connections:   . Frequency of Communication with Friends and Family:   . Frequency of Social Gatherings with Friends and Family:   . Attends Religious Services:   . Active Member of Clubs or Organizations:   . Attends Archivist Meetings:   [name] Marital Status:   Intimate Partner Violence:   . Fear of Current or Ex-Partner:   . Emotionally Abused:   [name] Physically Abused:   . Sexually Abused:     Outpatient Medications  Prior to Visit  Medication Sig Dispense Refill  . acetaminophen (TYLENOL) 500 MG tablet Take 500 mg by mouth every 6 (six) hours as needed.    [name] amLODipine (NORVASC) 10 MG tablet Take 1 tablet (10 mg total) by mouth daily. 90 tablet 0  . chlorthalidone (HYGROTON) 25 MG tablet Take 1 tablet (25 mg total) by mouth daily. 30 tablet 2  . Cholecalciferol (VITAMIN D3) 2000 units TABS Take 1 tablet by mouth daily.    . cloNIDine (CATAPRES - DOSED IN MG/24 HR) 0.2 mg/24hr patch Place 1 patch (0.2 mg total) onto the skin once a week. 4 patch 12  . clopidogrel (PLAVIX) 75 MG tablet Take 1 tablet (75 mg total) by mouth daily. 90 tablet 1  . gabapentin (NEURONTIN) 300 MG capsule Take 1 capsule (300 mg total) by mouth 3 (three) times daily. 90 capsule 0  . simvastatin (ZOCOR) 10 MG tablet Take 1 tablet (10 mg total) by mouth at bedtime. 90 tablet 3  . vitamin B-12  (CYANOCOBALAMIN) 500 MCG tablet Take 500 mcg by mouth daily.    . vitamin C (ASCORBIC ACID) 500 MG tablet Take 500 mg by mouth daily.     No facility-administered medications prior to visit.    Allergies  Allergen Reactions  . Codeine Other (See Comments)    No reaction noted  . Penicillins     REACTION: rash    ROS Review of Systems  Constitutional: Negative.   HENT: Negative.   Eyes: Negative for photophobia and visual disturbance.  Respiratory: Negative.   Cardiovascular: Positive for palpitations. Negative for chest pain.  Gastrointestinal: Negative.   Genitourinary: Negative.   Skin: Negative for pallor and rash.  Neurological: Positive for light-headedness and numbness. Negative for dizziness and speech difficulty.  Psychiatric/Behavioral: Negative.       Objective:    Physical Exam  Constitutional: He is oriented to person, place, and time. He appears well-developed and well-nourished. No distress.  HENT:  Head: Normocephalic and atraumatic.  Right Ear: External ear normal.  Left Ear: External ear normal.  Eyes: Conjunctivae are normal. Right eye exhibits no discharge. Left eye exhibits no discharge. No scleral icterus.  Neck: No JVD present. No tracheal deviation present.  Cardiovascular: Normal rate, regular rhythm and normal heart sounds.  Pulmonary/Chest: Effort normal and breath sounds normal. No stridor.  Musculoskeletal:        General: No edema.  Neurological: He is alert and oriented to person, place, and time.  Skin: Skin is warm and dry. He is not diaphoretic.  Psychiatric: He has a normal mood and affect. His behavior is normal.    BP (!) 158/80 (BP Location: Left Arm, Patient Position: Sitting, Cuff Size: Normal)   Pulse 80   Temp 98.4 F (36.9 C) (Temporal)   Ht 5\' 10"  (1.778 m)   Wt 199 lb 3.2 oz (90.4 kg)   SpO2 96%   BMI 28.58 kg/m  Wt Readings from Last 3 Encounters:  12/24/19 199 lb 3.2 oz (90.4 kg)  11/26/19 203 lb (92.1 kg)    10/26/19 203 lb 12.8 oz (92.4 kg)     Health Maintenance Due  Topic Date Due  . COVID-19 Vaccine (1) Never done    There are no preventive care reminders to display for this patient.  Lab Results  Component Value Date   TSH 2.650 04/04/2018   Lab Results  Component Value Date   WBC 7.2 05/15/2019   HGB 14.4 05/15/2019   HCT 44.8  05/15/2019   MCV 85.9 05/15/2019   PLT 231.0 05/15/2019   Lab Results  Component Value Date   NA 138 10/26/2019   K 4.4 10/26/2019   CO2 25 10/26/2019   GLUCOSE 104 (H) 10/26/2019   BUN 23 10/26/2019   CREATININE 1.17 10/26/2019   BILITOT 0.6 05/15/2019   ALKPHOS 112 05/15/2019   AST 13 05/15/2019   ALT 9 05/15/2019   PROT 6.7 05/15/2019   ALBUMIN 4.0 05/15/2019   CALCIUM 8.9 10/26/2019   GFR 58.56 (L) 10/26/2019   Lab Results  Component Value Date   CHOL 133 10/26/2019   Lab Results  Component Value Date   HDL 34.80 (L) 10/26/2019   Lab Results  Component Value Date   LDLCALC 74 10/26/2019   Lab Results  Component Value Date   TRIG 120.0 10/26/2019   Lab Results  Component Value Date   CHOLHDL 4 10/26/2019   No results found for: HGBA1C    Assessment & Plan:   Problem List Items Addressed This Visit      Cardiovascular and Mediastinum   Essential hypertension - Primary   Near syncope      No orders of the defined types were placed in this encounter.   Follow-up: Return in about 1 week (around 12/31/2019), or check and record bps. bring cuff with you., for consider calling 911 with next episode of rapid heart rate with lightheadedness.  Recheck in 1 week for BMP, B12 and blood pressure.  Continue clonidine patch and chlorthalidone.  Libby Maw, MD

## 2019-12-25 ENCOUNTER — Telehealth: Payer: Self-pay

## 2019-12-25 ENCOUNTER — Other Ambulatory Visit: Payer: Self-pay

## 2019-12-25 DIAGNOSIS — G629 Polyneuropathy, unspecified: Secondary | ICD-10-CM

## 2019-12-25 MED ORDER — GABAPENTIN 300 MG PO CAPS: 300.0000 mg | ORAL_CAPSULE | Freq: Three times a day (TID) | ORAL | 0 refills | Status: DC

## 2019-12-25 NOTE — Telephone Encounter (Signed)
Can we send in another 90 tablets?

## 2019-12-25 NOTE — Telephone Encounter (Signed)
Patient received a 30-day supply of gabapentin from Dr. Ethelene Hal on 5/25. In order to sync his gabapentin to line up with his other medications he needs a 36-day supply. Could we please send in an additional refill of gabapentin so we can line that medicine up for him?   Thanks,  Doristine Section Clinical Pharmacist Velora Heckler at [facility]  [phone]

## 2019-12-25 NOTE — Telephone Encounter (Signed)
[name].  Dr. Ethelene Hal sent in this med for 90 day supply as the others.  Does it still have to be changed?

## 2019-12-25 NOTE — Telephone Encounter (Signed)
He takes it three times daily, so 90 tablets is only a 30-day supply.

## 2019-12-25 NOTE — Telephone Encounter (Signed)
Yes, are you asking for pt to 180 day supply?

## 2019-12-25 NOTE — Telephone Encounter (Signed)
Patient received a 30-day supply of gabapentin from Dr. Ethelene Hal on 5/25. In order to sync his gabapentin to line up with his other medications he needs a 36-day supply. Could we please send in an additional refill of gabapentin so we can line that medicine up for him?   Thanks,  Doristine Section Clinical Pharmacist Velora Heckler at [facility]

## 2019-12-31 ENCOUNTER — Other Ambulatory Visit: Payer: Self-pay

## 2020-01-01 ENCOUNTER — Ambulatory Visit (INDEPENDENT_AMBULATORY_CARE_PROVIDER_SITE_OTHER): Payer: Medicare HMO | Admitting: Family Medicine

## 2020-01-01 ENCOUNTER — Encounter: Payer: Self-pay | Admitting: Family Medicine

## 2020-01-01 VITALS — BP 142/64 | HR 67 | Temp 97.8°F | Ht 70.0 in | Wt 198.4 lb

## 2020-01-01 DIAGNOSIS — G629 Polyneuropathy, unspecified: Secondary | ICD-10-CM | POA: Diagnosis not present

## 2020-01-01 DIAGNOSIS — N1831 Chronic kidney disease, stage 3a: Secondary | ICD-10-CM | POA: Diagnosis not present

## 2020-01-01 DIAGNOSIS — I10 Essential (primary) hypertension: Secondary | ICD-10-CM

## 2020-01-01 LAB — BASIC METABOLIC PANEL
BUN: 25 mg/dL — ABNORMAL HIGH (ref 6–23)
CO2: 26 mEq/L (ref 19–32)
Calcium: 9.2 mg/dL (ref 8.4–10.5)
Chloride: 101 mEq/L (ref 96–112)
Creatinine, Ser: 1.27 mg/dL (ref 0.40–1.50)
GFR: 53.25 mL/min — ABNORMAL LOW (ref >60.00)
Glucose, Bld: 119 mg/dL — ABNORMAL HIGH (ref 70–99)
Potassium: 4.1 mEq/L (ref 3.5–5.1)
Sodium: 135 mEq/L (ref 135–145)

## 2020-01-01 LAB — VITAMIN B12: Vitamin B-12: 1086 pg/mL — ABNORMAL HIGH (ref 211–911)

## 2020-01-01 NOTE — Progress Notes (Signed)
Established Patient Office Visit  Subjective:  Patient ID: [name], male    DOB: 02-18-1928  Age: 86 y.o. MRN: [id number]  CC:  Chief Complaint  Patient presents with  . Follow-up    follow up on BP, no concerns.     HPI [name] presents for follow-up for his blood pressure and peripheral neuropathy.  Blood pressure seems to be doing better with the weekly clonidine patch and chlorthalidone 25 mg daily.  Does not always remember to change the patch but he started putting it down on the calendar and that has been helpful.  Brings in his cuff today to compare with ours.  Feet are essentially numb.  He has stopped driving.  Gabapentin has helped a great deal with the pain.  Past Medical History:  Diagnosis Date  . Branch retinal vein occlusion of left eye   . High cholesterol   . Hypertension   . TIA (transient ischemic attack)     Past Surgical History:  Procedure Laterality Date  . CAROTID ENDARTERECTOMY Right   . HERNIA REPAIR    . INNER EAR SURGERY Right    for hearing loss    Family History  Problem Relation Age of Onset  . Lung cancer Father     Social History   Socioeconomic History  . Marital status: Widowed    Spouse name: Not on file  . Number of children: 4  . Years of education: Not on file  . 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 Smoking status: Never Smoker  . Smokeless tobacco: Never Used  Substance and Sexual Activity  . Alcohol use: Yes    Comment: 1 glass wine  . Drug use: Never  . Sexual activity: Not on file  Other Topics Concern  . Not on file  Social History Narrative   Lives alone   Social Determinants of Health   Financial Resource Strain: Low Risk   . Difficulty of Paying Living Expenses: Not hard at all  Food Insecurity: No Food Insecurity  . Worried About Charity fundraiser in the Last Year: Never true  . Ran Out of Food in  the Last Year: Never true  Transportation Needs: No Transportation Needs  . Lack of Transportation (Medical): No  . Lack of Transportation (Non-Medical): No  Physical Activity:   . Days of Exercise per Week:   . Minutes of Exercise per Session:   Stress:   . Feeling of Stress :   Social Connections:   . Frequency of Communication with Friends and Family:   . Frequency of Social Gatherings with Friends and Family:   . Attends Religious Services:   . Active Member of Clubs or Organizations:   . Attends Archivist Meetings:   [name] Marital Status:   Intimate Partner Violence:   . Fear of Current or Ex-Partner:   . Emotionally Abused:   [name] Physically Abused:   . Sexually Abused:     Outpatient Medications Prior to Visit  Medication Sig Dispense Refill  . acetaminophen (TYLENOL) 500 MG tablet Take 500 mg by mouth every 6 (six) hours as needed.    [name] amLODipine (NORVASC) 10 MG tablet Take 1 tablet (10 mg total) by mouth daily. 90 tablet 0  . chlorthalidone (HYGROTON) 25 MG tablet Take 1 tablet (25 mg total) by mouth daily. 30 tablet 2  . Cholecalciferol (VITAMIN  D3) 2000 units TABS Take 1 tablet by mouth daily.    . cloNIDine (CATAPRES - DOSED IN MG/24 HR) 0.2 mg/24hr patch Place 1 patch (0.2 mg total) onto the skin once a week. 4 patch 12  . clopidogrel (PLAVIX) 75 MG tablet Take 1 tablet (75 mg total) by mouth daily. 90 tablet 1  . gabapentin (NEURONTIN) 300 MG capsule Take 1 capsule (300 mg total) by mouth 3 (three) times daily. 90 capsule 0  . simvastatin (ZOCOR) 10 MG tablet Take 1 tablet (10 mg total) by mouth at bedtime. 90 tablet 3  . vitamin B-12 (CYANOCOBALAMIN) 500 MCG tablet Take 500 mcg by mouth daily.    . vitamin C (ASCORBIC ACID) 500 MG tablet Take 500 mg by mouth daily.     No facility-administered medications prior to visit.    Allergies  Allergen Reactions  . Codeine Other (See Comments)    No reaction noted  . Penicillins     REACTION: rash     ROS Review of Systems  Constitutional: Negative.   HENT: Negative.   Eyes: Negative for photophobia and visual disturbance.  Respiratory: Negative.   Cardiovascular: Negative.   Gastrointestinal: Negative.   Endocrine: Negative for polyphagia and polyuria.  Genitourinary: Negative.   Musculoskeletal: Negative for joint swelling and myalgias.  Skin: Negative for pallor and rash.  Allergic/Immunologic: Negative for immunocompromised state.  Neurological: Positive for numbness. Negative for speech difficulty and weakness.  Hematological: Does not bruise/bleed easily.  Psychiatric/Behavioral: Negative.       Objective:    Physical Exam  Constitutional: He is oriented to person, place, and time. He appears well-developed and well-nourished. No distress.  HENT:  Head: Normocephalic and atraumatic.  Right Ear: External ear normal.  Left Ear: External ear normal.  Eyes: Conjunctivae are normal. Right eye exhibits no discharge. Left eye exhibits no discharge. No scleral icterus.  Neck: No JVD present. No tracheal deviation present.  Cardiovascular: Normal rate and regular rhythm.  Pulmonary/Chest: Effort normal and breath sounds normal. No stridor.  Abdominal: Bowel sounds are normal.  Musculoskeletal:        General: No edema.  Neurological: He is alert and oriented to person, place, and time.  Skin: Skin is warm and dry. He is not diaphoretic.  Psychiatric: He has a normal mood and affect. His behavior is normal.    BP (!) 142/64   Pulse 67   Temp 97.8 F (36.6 C) (Tympanic)   Ht 5\' 10"  (1.778 m)   Wt 198 lb 6.4 oz (90 kg)   SpO2 96%   BMI 28.47 kg/m  Wt Readings from Last 3 Encounters:  01/01/20 198 lb 6.4 oz (90 kg)  12/24/19 199 lb 3.2 oz (90.4 kg)  11/26/19 203 lb (92.1 kg)     Health Maintenance Due  Topic Date Due  . COVID-19 Vaccine (1) Never done    There are no preventive care reminders to display for this patient.  Lab Results  Component Value  Date   TSH 2.650 04/04/2018   Lab Results  Component Value Date   WBC 7.2 05/15/2019   HGB 14.4 05/15/2019   HCT 44.8 05/15/2019   MCV 85.9 05/15/2019   PLT 231.0 05/15/2019   Lab Results  Component Value Date   NA 138 10/26/2019   K 4.4 10/26/2019   CO2 25 10/26/2019   GLUCOSE 104 (H) 10/26/2019   BUN 23 10/26/2019   CREATININE 1.17 10/26/2019   BILITOT 0.6 05/15/2019   ALKPHOS  112 05/15/2019   AST 13 05/15/2019   ALT 9 05/15/2019   PROT 6.7 05/15/2019   ALBUMIN 4.0 05/15/2019   CALCIUM 8.9 10/26/2019   GFR 58.56 (L) 10/26/2019   Lab Results  Component Value Date   CHOL 133 10/26/2019   Lab Results  Component Value Date   HDL 34.80 (L) 10/26/2019   Lab Results  Component Value Date   LDLCALC 74 10/26/2019   Lab Results  Component Value Date   TRIG 120.0 10/26/2019   Lab Results  Component Value Date   CHOLHDL 4 10/26/2019   No results found for: HGBA1C    Assessment & Plan:   Problem List Items Addressed This Visit      Cardiovascular and Mediastinum   Essential hypertension - Primary   Relevant Orders   Basic metabolic panel     Nervous and Auditory   Neuropathy   Relevant Orders   Vitamin B12     Genitourinary   Stage 3a chronic kidney disease   Relevant Orders   Basic metabolic panel      No orders of the defined types were placed in this encounter.   Follow-up: Return in about 3 months (around 04/02/2020).   Home cuff is reading significantly higher than our measurement.  With normal blood work believe that we can see him back in 3 months.  Continue gabapentin for neuropathy and clonidine patch with chlorthalidone for hypertension. Libby Maw, MD

## 2020-01-01 NOTE — Patient Instructions (Signed)
DASH Eating Plan DASH stands for "Dietary Approaches to Stop Hypertension." The DASH eating plan is a healthy eating plan that has been shown to reduce high blood pressure (hypertension). It may also reduce your risk for type 2 diabetes, heart disease, and stroke. The DASH eating plan may also help with weight loss. What are tips for following this plan?  General guidelines  Avoid eating more than 2,300 mg (milligrams) of salt (sodium) a day. If you have hypertension, you may need to reduce your sodium intake to 1,500 mg a day.  Limit alcohol intake to no more than 1 drink a day for nonpregnant women and 2 drinks a day for men. One drink equals 12 oz of beer, 5 oz of wine, or 1 oz of hard liquor.  Work with your health care provider to maintain a healthy body weight or to lose weight. Ask what an ideal weight is for you.  Get at least 30 minutes of exercise that causes your heart to beat faster (aerobic exercise) most days of the week. Activities may include walking, swimming, or biking.  Work with your health care provider or diet and nutrition specialist (dietitian) to adjust your eating plan to your individual calorie needs. Reading food labels   Check food labels for the amount of sodium per serving. Choose foods with less than 5 percent of the Daily Value of sodium. Generally, foods with less than 300 mg of sodium per serving fit into this eating plan.  To find whole grains, look for the word "whole" as the first word in the ingredient list. Shopping  Buy products labeled as "low-sodium" or "no salt added."  Buy fresh foods. Avoid canned foods and premade or frozen meals. Cooking  Avoid adding salt when cooking. Use salt-free seasonings or herbs instead of table salt or sea salt. Check with your health care provider or pharmacist before using salt substitutes.  Do not fry foods. Cook foods using healthy methods such as baking, boiling, grilling, and broiling instead.  Cook with  heart-healthy oils, such as olive, canola, soybean, or sunflower oil. Meal planning  Eat a balanced diet that includes: ? 5 or more servings of fruits and vegetables each day. At each meal, try to fill half of your plate with fruits and vegetables. ? Up to 6-8 servings of whole grains each day. ? Less than 6 oz of lean meat, poultry, or fish each day. A 3-oz serving of meat is about the same size as a deck of cards. One egg equals 1 oz. ? 2 servings of low-fat dairy each day. ? A serving of nuts, seeds, or beans 5 times each week. ? Heart-healthy fats. Healthy fats called Omega-3 fatty acids are found in foods such as flaxseeds and coldwater fish, like sardines, salmon, and mackerel.  Limit how much you eat of the following: ? Canned or prepackaged foods. ? Food that is high in trans fat, such as fried foods. ? Food that is high in saturated fat, such as fatty meat. ? Sweets, desserts, sugary drinks, and other foods with added sugar. ? Full-fat dairy products.  Do not salt foods before eating.  Try to eat at least 2 vegetarian meals each week.  Eat more home-cooked food and less restaurant, buffet, and fast food.  When eating at a restaurant, ask that your food be prepared with less salt or no salt, if possible. What foods are recommended? The items listed may not be a complete list. Talk with your dietitian about   what dietary choices are best for you. Grains Whole-grain or whole-wheat bread. Whole-grain or whole-wheat pasta. Brown rice. Oatmeal. Quinoa. Bulgur. Whole-grain and low-sodium cereals. Pita bread. Low-fat, low-sodium crackers. Whole-wheat flour tortillas. Vegetables Fresh or frozen vegetables (raw, steamed, roasted, or grilled). Low-sodium or reduced-sodium tomato and vegetable juice. Low-sodium or reduced-sodium tomato sauce and tomato paste. Low-sodium or reduced-sodium canned vegetables. Fruits All fresh, dried, or frozen fruit. Canned fruit in natural juice (without  added sugar). Meat and other protein foods Skinless chicken or turkey. Ground chicken or turkey. Pork with fat trimmed off. Fish and seafood. Egg whites. Dried beans, peas, or lentils. Unsalted nuts, nut butters, and seeds. Unsalted canned beans. Lean cuts of beef with fat trimmed off. Low-sodium, lean deli meat. Dairy Low-fat (1%) or fat-free (skim) milk. Fat-free, low-fat, or reduced-fat cheeses. Nonfat, low-sodium ricotta or cottage cheese. Low-fat or nonfat yogurt. Low-fat, low-sodium cheese. Fats and oils Soft margarine without trans fats. Vegetable oil. Low-fat, reduced-fat, or light mayonnaise and salad dressings (reduced-sodium). Canola, safflower, olive, soybean, and sunflower oils. Avocado. Seasoning and other foods Herbs. Spices. Seasoning mixes without salt. Unsalted popcorn and pretzels. Fat-free sweets. What foods are not recommended? The items listed may not be a complete list. Talk with your dietitian about what dietary choices are best for you. Grains Baked goods made with fat, such as croissants, muffins, or some breads. Dry pasta or rice meal packs. Vegetables Creamed or fried vegetables. Vegetables in a cheese sauce. Regular canned vegetables (not low-sodium or reduced-sodium). Regular canned tomato sauce and paste (not low-sodium or reduced-sodium). Regular tomato and vegetable juice (not low-sodium or reduced-sodium). Pickles. Olives. Fruits Canned fruit in a light or heavy syrup. Fried fruit. Fruit in cream or butter sauce. Meat and other protein foods Fatty cuts of meat. Ribs. Fried meat. Bacon. Sausage. Bologna and other processed lunch meats. Salami. Fatback. Hotdogs. Bratwurst. Salted nuts and seeds. Canned beans with added salt. Canned or smoked fish. Whole eggs or egg yolks. Chicken or turkey with skin. Dairy Whole or 2% milk, cream, and half-and-half. Whole or full-fat cream cheese. Whole-fat or sweetened yogurt. Full-fat cheese. Nondairy creamers. Whipped toppings.  Processed cheese and cheese spreads. Fats and oils Butter. Stick margarine. Lard. Shortening. Ghee. Bacon fat. Tropical oils, such as coconut, palm kernel, or palm oil. Seasoning and other foods Salted popcorn and pretzels. Onion salt, garlic salt, seasoned salt, table salt, and sea salt. Worcestershire sauce. Tartar sauce. Barbecue sauce. Teriyaki sauce. Soy sauce, including reduced-sodium. Steak sauce. Canned and packaged gravies. Fish sauce. Oyster sauce. Cocktail sauce. Horseradish that you find on the shelf. Ketchup. Mustard. Meat flavorings and tenderizers. Bouillon cubes. Hot sauce and Tabasco sauce. Premade or packaged marinades. Premade or packaged taco seasonings. Relishes. Regular salad dressings. Where to find more information:  National Heart, Lung, and Blood Institute: www.nhlbi.nih.gov  American Heart Association: www.heart.org Summary  The DASH eating plan is a healthy eating plan that has been shown to reduce high blood pressure (hypertension). It may also reduce your risk for type 2 diabetes, heart disease, and stroke.  With the DASH eating plan, you should limit salt (sodium) intake to 2,300 mg a day. If you have hypertension, you may need to reduce your sodium intake to 1,500 mg a day.  When on the DASH eating plan, aim to eat more fresh fruits and vegetables, whole grains, lean proteins, low-fat dairy, and heart-healthy fats.  Work with your health care provider or diet and nutrition specialist (dietitian) to adjust your eating plan to your   individual calorie needs. This information is not intended to replace advice given to you by your health care provider. Make sure you discuss any questions you have with your health care provider. Document Revised: 06/28/2017 Document Reviewed: 07/09/2016 Elsevier Patient Education  2020 [employer]  

## 2020-01-06 ENCOUNTER — Other Ambulatory Visit: Payer: Self-pay

## 2020-01-07 ENCOUNTER — Ambulatory Visit: Payer: Medicare HMO

## 2020-01-07 VITALS — BP 142/82

## 2020-01-07 DIAGNOSIS — I10 Essential (primary) hypertension: Secondary | ICD-10-CM

## 2020-01-07 DIAGNOSIS — E7800 Pure hypercholesterolemia, unspecified: Secondary | ICD-10-CM

## 2020-01-07 NOTE — Chronic Care Management (AMB) (Signed)
Chronic Care Management Pharmacy  Name: [name]  MRN: [id number] DOB: 12-11-1927  Chief Complaint/ HPI  [name],  86 y.o. , male presents for their Initial CCM visit with the clinical pharmacist via telephone.    PCP : Libby Maw, MD  Their chronic conditions include: Hypertension, TIA, chronic Phillips disease, dyslipidemia   Office Visits: 01/01/20: Patient presented to Dr. Ethelene Hal for HTN follow-up. Patient BP 142/64. Some difficulties with remembering to change the patches.  11/26/19: Patient presented to Dr. Ethelene Hal for HTN follow-up. Patient BP in clinic 162/80. Home BP 150/80-90. Patient experiencing dry mouth with clonidine. Patient reports low energy/stamina. Furosemide increased to 40 mg daily, clonidine decreased to 0.1 mg twice daily.  10/26/19: Patient presented to Dr. Ethelene Hal for HTN follow-up. Patient BP 158/72. Patient tolerating clonidine well, swelling in legs. Clonidine increased to 0.2 mg BID. 10/14/19: Patient presented to Naaman Plummer, RN for AWV.  07/27/19: Patient presented to Dr. Ethelene Hal for HTN follow-up. BP in clinic 141/68. No medication changes made.   Consult Visit: None noted.   Medications: Outpatient Encounter Medications as of 01/07/2020  Medication Sig  . acetaminophen (TYLENOL) 500 MG tablet Take 500 mg by mouth every 6 (six) hours as needed.  [name] amLODipine (NORVASC) 10 MG tablet Take 1 tablet (10 mg total) by mouth daily.  . chlorthalidone (HYGROTON) 25 MG tablet Take 1 tablet (25 mg total) by mouth daily.  . Cholecalciferol (VITAMIN D3) 2000 units TABS Take 1 tablet by mouth daily.  . cloNIDine (CATAPRES - DOSED IN MG/24 HR) 0.2 mg/24hr patch Place 1 patch (0.2 mg total) onto the skin once a week.  . clopidogrel (PLAVIX) 75 MG tablet Take 1 tablet (75 mg total) by mouth daily.  [name] gabapentin (NEURONTIN) 300 MG capsule Take 1 capsule (300 mg total) by mouth 3 (three) times daily.  . simvastatin (ZOCOR) 10 MG tablet Take 1 tablet  (10 mg total) by mouth at bedtime.  . vitamin B-12 (CYANOCOBALAMIN) 500 MCG tablet Take 500 mcg by mouth daily.  . vitamin C (ASCORBIC ACID) 500 MG tablet Take 500 mg by mouth daily.   No facility-administered encounter medications on file as of 01/07/2020.   Current Diagnosis/Assessment:  Goals Addressed            This Visit's Progress   . Chronic Care Management   On track    CARE PLAN ENTRY  Current Barriers:  . Chronic Disease Management support, education, and care coordination needs related to Hypertension, Hyperlipidemia, and Chronic Phillips Disease   Hypertension . Pharmacist Clinical Goal(s): o Over the next 30 days, patient will work with PharmD and providers to achieve BP goal <140/90 . Current regimen:  o Amlodipine 10 mg daily  o Clonidine 0.2 mg patch once weekly o Chlorthalidone 25 mg daily . Patient self care activities - Over the next 30 days, patient will: o Continue checking blood pressure daily, document, and provide at future appointments o Ensure daily salt intake < 2300 mg/day  Hyperlipidemia . Pharmacist Clinical Goal(s): o Over the next 180 days, patient will work with PharmD and providers to maintain LDL goal less than 100 . Current regimen:  o Simvastatin 10 mg nightly  Medication management . Pharmacist Clinical Goal(s): o Over the next 90 days, patient will work with PharmD and providers to achieve optimal medication adherence . Current pharmacy: [facility] . Interventions o Comprehensive medication review performed. o Utilize [facility] for medication synchronization, packaging and delivery.   [name]  Patient self care activities - Over the next 90 days, patient will: o Take medications as prescribed o Report any questions or concerns to PharmD and/or provider(s)        Hypertension   BP today is: >140/90  Office blood pressures are  BP Readings from Last 3 Encounters:  01/07/20 (!) 142/82  01/01/20 (!) 142/64  12/24/19  (!) 158/80   CMP Latest Ref Rng & Units 01/01/2020 10/26/2019 08/11/2019  Glucose 70 - 99 mg/dL 119(H) 104(H) 92  BUN 6 - 23 mg/dL 25(H) 23 23  Creatinine 0.40 - 1.50 mg/dL 1.27 1.17 1.37  Sodium 135 - 145 mEq/L 135 138 138  Potassium 3.5 - 5.1 mEq/L 4.1 4.4 4.1  Chloride 96 - 112 mEq/L 101 104 100  CO2 19 - 32 mEq/L 26 25 28   Calcium 8.4 - 10.5 mg/dL 9.2 8.9 9.2  Total Protein 6.0 - 8.3 g/dL - - -  Total Bilirubin 0.2 - 1.2 mg/dL - - -  Alkaline Phos 39 - 117 U/L - - -  AST 0 - 37 U/L - - -  ALT 0 - 53 U/L - - -   Patient has failed these meds in the past: lisinopril (hyperkalemia), HCTZ (Elevated Cr), furosemide (ineffective) Patient is currently uncontrolled on the following medications:   Amlodipine 10 mg daily (AM)   Clonidine 0.2mg /24 hr patch weekly   Chlorthalidone 25 mg daily (AM) Patient checks BP at home daily  Patient home BP readings are ranging: 140/70-156/85  We discussed diet and exercise extensively. Diet: Fruit, fish, high potassium diet.does not salt foods. Watches salt content in foods his daughter purchases.   Dry mouth has significantly improved since switching to clonidine patch. Denies dizziness, chest pain, or frequent headaches (although patient does have a history of migraines). Patient does report blurry vision and watery eyes which has been ongoing for 2-3 years.   Blurred vision may be caused by clonidine, which may improve with switching to patch formulation. Will continue to monitor and decrease dose if blurred vision does not improve or worsens.   Plan  Continue current medications.   Hyperlipidemia   History of TIA 2009  Lipid Panel     Component Value Date/Time   CHOL 133 10/26/2019 1136   TRIG 120.0 10/26/2019 1136   HDL 34.80 (L) 10/26/2019 1136   CHOLHDL 4 10/26/2019 1136   VLDL 24.0 10/26/2019 1136   LDLCALC 74 10/26/2019 1136   LDLDIRECT 88.0 05/15/2019 1137     The ASCVD Risk score (Goff DC Jr., et al., 2013) failed to calculate  for the following reasons:   The 2013 ASCVD risk score is only valid for ages 61 to 71   Patient has failed these meds in past: n/a Patient is currently controlled on the following medications:   Clopidogrel 75 mg daily (AM)  Simvastatin 10 mg QHS  LDL goal <100 We discussed:  diet and exercise extensively  Plan  Continue current medications  Peripheral Neuropathy   Patient has failed these meds in past: n/a Patient is currently controlled on the following medications:  Gabapentin 300 mg TID (AM, EM, 4 AM)   We discussed:  Peripheral neuropathy in feet much improved since patient has been on gabapentin, denies significant CNS depression or sedation.  Plan  Continue current medications  Misc/OTC   Acetaminophen 500 mg q6hr PRN (takes twice daily most days) Vitamin D3 2000 units daily Vitamin C 500 mg daily   Patient stopped vitamin B12 after recent labwork  showed vitamin b12 level was above normal limit.  Plan  Recommend stopping vitamin B12 and re-evaluating in 3 months  Vaccines   Reviewed and discussed patient's vaccination history.    Immunization History  Administered Date(s) Administered  . Fluad Quad(high Dose 65+) 04/15/2019  . Influenza Whole 06/19/2007  . Influenza, High Dose Seasonal PF 05/02/2018  . Influenza-Unspecified 05/13/2017  . Pneumococcal Conjugate-13 07/31/2015  . Pneumococcal Polysaccharide-23 07/31/2015, 10/24/2016    Plan  Recommended patient receive Covid vaccine  Medication Management   Pt uses [facility] for all medications. Reviewed patient's [facility] medication and Epic medication profile assuring there are no discrepancies or gaps in therapy. Confirmed all fill dates appropriate and verified with patient that there is a sufficient quantity of all prescribed medications at home. Informed patient to call me any time if needing medications before scheduled deliveries. The anticipated medication sync date is  02/08/20.   Follow up: 3 months   [place] at [facility]  [phone]

## 2020-01-07 NOTE — Patient Instructions (Addendum)
Visit Information  Goals Addressed            This Visit's Progress   . Chronic Care Management   On track    CARE PLAN ENTRY  Current Barriers:  . Chronic Disease Management support, education, and care coordination needs related to Hypertension, Hyperlipidemia, and Chronic Kidney Disease   Hypertension . Pharmacist Clinical Goal(s): o Over the next 30 days, patient will work with PharmD and providers to achieve BP goal <140/90 . Current regimen:  o Amlodipine 10 mg daily  o Clonidine 0.2 mg patch once weekly o Chlorthalidone 25 mg daily . Patient self care activities - Over the next 30 days, patient will: o Continue checking blood pressure daily, document, and provide at future appointments o Ensure daily salt intake < 2300 mg/day  Hyperlipidemia . Pharmacist Clinical Goal(s): o Over the next 180 days, patient will work with PharmD and providers to maintain LDL goal less than 100 . Current regimen:  o Simvastatin 10 mg nightly  Medication management . Pharmacist Clinical Goal(s): o Over the next 90 days, patient will work with PharmD and providers to achieve optimal medication adherence . Current pharmacy: [facility] . Interventions o Comprehensive medication review performed. o Utilize [facility] for medication synchronization, packaging and delivery.   . Patient self care activities - Over the next 90 days, patient will: o Take medications as prescribed o Report any questions or concerns to PharmD and/or provider(s)        The patient verbalized understanding of instructions provided today and agreed to receive a mailed copy of patient instruction and/or educational materials.  Face to Face appointment with pharmacist scheduled for:  03/31/20 at 11:30 AM (After visit with Dr. Ethelene Hal)  [place] Clinical Pharmacist Velora Heckler at [place]  [place] stands for "Dietary Approaches to Stop Hypertension." The  DASH eating plan is a healthy eating plan that has been shown to reduce high blood pressure (hypertension). It may also reduce your risk for type 2 diabetes, heart disease, and stroke. The DASH eating plan may also help with weight loss. What are tips for following this plan?  General guidelines  Avoid eating more than 2,300 mg (milligrams) of salt (sodium) a day. If you have hypertension, you may need to reduce your sodium intake to 1,500 mg a day.  Limit alcohol intake to no more than 1 drink a day for nonpregnant women and 2 drinks a day for men. One drink equals 12 oz of beer, 5 oz of wine, or 1 oz of hard liquor.  Work with your health care provider to maintain a healthy body weight or to lose weight. Ask what an ideal weight is for you.  Get at least 30 minutes of exercise that causes your heart to beat faster (aerobic exercise) most days of the week. Activities may include walking, swimming, or biking.  Work with your health care provider or diet and nutrition specialist (dietitian) to adjust your eating plan to your individual calorie needs. Reading food labels   Check food labels for the amount of sodium per serving. Choose foods with less than 5 percent of the Daily Value of sodium. Generally, foods with less than 300 mg of sodium per serving fit into this eating plan.  To find whole grains, look for the word "whole" as the first word in the ingredient list. Shopping  Buy products labeled as "low-sodium" or "no salt added."  Buy fresh foods. Avoid canned foods and  premade or frozen meals. Cooking  Avoid adding salt when cooking. Use salt-free seasonings or herbs instead of table salt or sea salt. Check with your health care provider or pharmacist before using salt substitutes.  Do not fry foods. Cook foods using healthy methods such as baking, boiling, grilling, and broiling instead.  Cook with heart-healthy oils, such as olive, canola, soybean, or sunflower oil. Meal  planning  Eat a balanced diet that includes: ? 5 or more servings of fruits and vegetables each day. At each meal, try to fill half of your plate with fruits and vegetables. ? Up to 6-8 servings of whole grains each day. ? Less than 6 oz of lean meat, poultry, or fish each day. A 3-oz serving of meat is about the same size as a deck of cards. One egg equals 1 oz. ? 2 servings of low-fat dairy each day. ? A serving of nuts, seeds, or beans 5 times each week. ? Heart-healthy fats. Healthy fats called Omega-3 fatty acids are found in foods such as flaxseeds and coldwater fish, like sardines, salmon, and mackerel.  Limit how much you eat of the following: ? Canned or prepackaged foods. ? Food that is high in trans fat, such as fried foods. ? Food that is high in saturated fat, such as fatty meat. ? Sweets, desserts, sugary drinks, and other foods with added sugar. ? Full-fat dairy products.  Do not salt foods before eating.  Try to eat at least 2 vegetarian meals each week.  Eat more home-cooked food and less restaurant, buffet, and fast food.  When eating at a restaurant, ask that your food be prepared with less salt or no salt, if possible. What foods are recommended? The items listed may not be a complete list. Talk with your dietitian about what dietary choices are best for you. Grains Whole-grain or whole-wheat bread. Whole-grain or whole-wheat pasta. Brown rice. [place]row. Bulgur. Whole-grain and low-sodium cereals. Pita bread. Low-fat, low-sodium crackers. Whole-wheat flour tortillas. Vegetables Fresh or frozen vegetables (raw, steamed, roasted, or grilled). Low-sodium or reduced-sodium tomato and vegetable juice. Low-sodium or reduced-sodium tomato sauce and tomato paste. Low-sodium or reduced-sodium canned vegetables. Fruits All fresh, dried, or frozen fruit. Canned fruit in natural juice (without added sugar). Meat and other protein foods Skinless chicken or [place].  Ground chicken or [place]. Pork with fat trimmed off. Fish and seafood. Egg whites. Dried beans, peas, or lentils. Unsalted nuts, nut butters, and seeds. Unsalted canned beans. Lean cuts of beef with fat trimmed off. Low-sodium, lean deli meat. Dairy Low-fat (1%) or fat-free (skim) milk. Fat-free, low-fat, or reduced-fat cheeses. Nonfat, low-sodium ricotta or cottage cheese. Low-fat or nonfat yogurt. Low-fat, low-sodium cheese. Fats and oils Soft margarine without trans fats. Vegetable oil. Low-fat, reduced-fat, or light mayonnaise and salad dressings (reduced-sodium). Canola, safflower, olive, soybean, and sunflower oils. Avocado. Seasoning and other foods Herbs. Spices. Seasoning mixes without salt. Unsalted popcorn and pretzels. Fat-free sweets. What foods are not recommended? The items listed may not be a complete list. Talk with your dietitian about what dietary choices are best for you. Grains Baked goods made with fat, such as croissants, muffins, or some breads. Dry pasta or rice meal packs. Vegetables Creamed or fried vegetables. Vegetables in a cheese sauce. Regular canned vegetables (not low-sodium or reduced-sodium). Regular canned tomato sauce and paste (not low-sodium or reduced-sodium). Regular tomato and vegetable juice (not low-sodium or reduced-sodium). Angie Fava. Olives. Fruits Canned fruit in a light or heavy syrup. Fried fruit. Fruit  in cream or butter sauce. Meat and other protein foods Fatty cuts of meat. Ribs. Fried meat. Berniece Salines. Sausage. Bologna and other processed lunch meats. Salami. Fatback. Hotdogs. Bratwurst. Salted nuts and seeds. Canned beans with added salt. Canned or smoked fish. Whole eggs or egg yolks. Chicken or [place] with skin. Dairy Whole or 2% milk, cream, and half-and-half. Whole or full-fat cream cheese. Whole-fat or sweetened yogurt. Full-fat cheese. Nondairy creamers. Whipped toppings. Processed cheese and cheese spreads. Fats and oils Butter. Stick  margarine. Lard. Shortening. Ghee. Bacon fat. Tropical oils, such as coconut, palm kernel, or palm oil. Seasoning and other foods Salted popcorn and pretzels. Onion salt, garlic salt, seasoned salt, table salt, and sea salt. Worcestershire sauce. Tartar sauce. Barbecue sauce. Teriyaki sauce. Soy sauce, including reduced-sodium. Steak sauce. Canned and packaged gravies. Fish sauce. Oyster sauce. Cocktail sauce. Horseradish that you find on the shelf. Ketchup. Mustard. Meat flavorings and tenderizers. Bouillon cubes. Hot sauce and Tabasco sauce. Premade or packaged marinades. Premade or packaged taco seasonings. Relishes. Regular salad dressings. Where to find more information:  National Heart, Lung, and [place]: https://wilson-eaton.com/  American Heart Association: www.heart.org Summary  The DASH eating plan is a healthy eating plan that has been shown to reduce high blood pressure (hypertension). It may also reduce your risk for type 2 diabetes, heart disease, and stroke.  With the DASH eating plan, you should limit salt (sodium) intake to 2,300 mg a day. If you have hypertension, you may need to reduce your sodium intake to 1,500 mg a day.  When on the DASH eating plan, aim to eat more fresh fruits and vegetables, whole grains, lean proteins, low-fat dairy, and heart-healthy fats.  Work with your health care provider or diet and nutrition specialist (dietitian) to adjust your eating plan to your individual calorie needs. This information is not intended to replace advice given to you by your health care provider. Make sure you discuss any questions you have with your health care provider. Document Revised: 06/28/2017 Document Reviewed: 07/09/2016 Elsevier Patient Education  2020 [employer].

## 2020-02-02 ENCOUNTER — Telehealth: Payer: Self-pay

## 2020-02-02 ENCOUNTER — Ambulatory Visit: Payer: Self-pay

## 2020-02-02 DIAGNOSIS — I10 Essential (primary) hypertension: Secondary | ICD-10-CM

## 2020-02-02 DIAGNOSIS — G629 Polyneuropathy, unspecified: Secondary | ICD-10-CM

## 2020-02-02 DIAGNOSIS — E7800 Pure hypercholesterolemia, unspecified: Secondary | ICD-10-CM

## 2020-02-02 NOTE — Chronic Care Management (AMB) (Signed)
Reviewed chart for medication changes ahead of medication coordination call.  No OVs, Consults, or hospital visits since last care coordination call/Pharmacist visit. No medication changes indicated.  Patient removed clonidine patch early on 02/01/20 due to worsening side effects. He has had Blurry vision, watery eyes which has progressively worsened over the past few weeks. He states his dry mouth has worsened again.   BP Readings from Last 3 Encounters:  01/07/20 (!) 142/82  01/01/20 (!) 142/64  12/24/19 (!) 158/80    Home BP include:  02/01/20: 132/69, pulse 49  01/29/20: 146/85 01/28/20: 152/67   No results found for: HGBA1C   Patient obtains medications through Adherence Packaging  30 Days   Last adherence delivery included: n/a  Patient is due for next adherence delivery on: 02/05/20 Called patient and reviewed medications and coordinated delivery.  This delivery to include:  Clopidogrel 75 mg daily - breakfast   Gabapentin 300 mg one tablet three times daily - breakfast, evening meal, bedtime   Simvastatin 10 mg daily - bedtime   Chlorthalidone 25 mg daily - breakfast   Vitamin D3 2000 units daily - breakfast   Vitamin C 500 mg daily - breakfast   Amlodipine 10 mg daily - breakfast  Patient declined the following medications (meds) due to (reason)  Vitamin B complex (patient no longer taking)   Clonidine 0.2mg /24hr patch (Advice requested from PCP regarding dose decrease/therapy change)  Patient needs refills for Gabapentin 300 mg TID.  Confirmed delivery date of 02/05/20, advised patient that pharmacy will contact them the morning of delivery.  [place] at [facility]  [phone]

## 2020-02-02 NOTE — Telephone Encounter (Signed)
Dr. Ethelene Hal,  I spoke with [name] to organize his next medication delivery, but during our conversation he stated he has had progressively worsening blurry vision and dry mouth. He removed his clonidine patch early on 02/01/20 due to worsening side effects. He has had Blurry vision, watery eyes which has progressively worsened over the past few weeks. He states his dry mouth has worsened again.   Recent Blood pressures:  02/01/20: 132/69, pulse 49  01/29/20: 146/85 01/28/20: 152/67   What would you think about decreasing his clonidine to the 0.1mg /24hr patch weekly?   Patient is also requesting a refill of gabapentin 300 mg TID be sent into [facility].  Please let me know your thoughts so I can communicate them with the patient.  Thanks,  Doristine Section Clinical Pharmacist Velora Heckler at [facility]  [phone]

## 2020-02-03 MED ORDER — GABAPENTIN 300 MG PO CAPS: 300.0000 mg | ORAL_CAPSULE | Freq: Three times a day (TID) | ORAL | 0 refills | Status: DC

## 2020-02-03 MED ORDER — CLONIDINE 0.1 MG/24HR TD PTWK: 0.1000 mg | MEDICATED_PATCH | TRANSDERMAL | 12 refills | Status: DC

## 2020-02-03 NOTE — Addendum Note (Signed)
Addended by: Jon Billings on: 02/03/2020 07:34 AM   Modules accepted: Orders

## 2020-02-23 ENCOUNTER — Telehealth: Payer: Self-pay | Admitting: Family Medicine

## 2020-02-23 NOTE — Progress Notes (Signed)
  Chronic Care Management   Note  02/23/2020 Name: CAYTON CUEVAS MRN: [id number] DOB: 13-Apr-1928  [name] is a 86 y.o. year old male who is a primary care patient of Libby Maw, MD. I reached out to [name] by phone today in response to a referral sent by [name] PCP, Libby Maw, MD.   Mr. Joslyn was given information about Chronic Care Management services today including:  1. CCM service includes personalized support from designated clinical staff supervised by his physician, including individualized plan of care and coordination with other care providers 2. 24/7 contact phone numbers for assistance for urgent and routine care needs. 3. Service will only be billed when office clinical staff spend 20 minutes or more in a month to coordinate care. 4. Only one practitioner may furnish and bill the service in a calendar month. 5. The patient may stop CCM services at any time (effective at the end of the month) by phone call to the office staff.   Patient agreed to services and verbal consent obtained.   Follow up plan:   Carley Perdue [facility] Scheduler

## 2020-02-25 ENCOUNTER — Telehealth: Payer: Self-pay

## 2020-02-25 DIAGNOSIS — I10 Essential (primary) hypertension: Secondary | ICD-10-CM

## 2020-02-25 DIAGNOSIS — E7800 Pure hypercholesterolemia, unspecified: Secondary | ICD-10-CM

## 2020-02-25 NOTE — Progress Notes (Addendum)
Reviewed chart for medication changes ahead of medication coordination call.  No OVs, Consults, or hospital visits since last care coordination call/Pharmacist visit. No medication changes indicated OR if recent visit, treatment plan here.  BP Readings from Last 3 Encounters:  01/07/20 (!) 142/82  01/01/20 (!) 142/64  12/24/19 (!) 158/80    No results found for: HGBA1C   Patient obtains medications through Adherence Packaging  30 Days   Last adherence delivery included:  Clopidogrel 75 mg daily - breakfast              Gabapentin 300 mg one tablet three times daily - breakfast, evening meal, bedtime              Simvastatin 10 mg daily - bedtime              Chlorthalidone 25 mg daily - breakfast              Vitamin D3 2000 units daily - breakfast              Vitamin C 500 mg daily - breakfast              Amlodipine 10 mg daily - breakfast   Patient is due for next adherence delivery on: 03/01/2020.  Called patient and reviewed medications and coordinated delivery.  This delivery to include:  Clopidogrel 75 mg daily - breakfast              Gabapentin 300 mg one tablet three times daily - breakfast, evening meal, bedtime              Simvastatin 10 mg daily - bedtime              Chlorthalidone 25 mg daily - breakfast              Vitamin D3 2000 units daily - breakfast              Vitamin C 500 mg daily - breakfast              Amlodipine 10 mg daily - breakfast   Clonidine (CARAPRES- DOSED in MG/24) 0.1mg /24hr patch  Patient declined the following medications (meds) due to (reason)  Vitamin B- 12  (patient no longer taking) Patient stated that he was advised to stop because his lab work was high , and his PCP will reassess at next appointment.   Patient needs refills for  Chlorthalidone 25 mg daily Gabapentin 300 mg TID. Will send request to PCP.   Confirmed delivery date of 03/01/2020, advised patient that pharmacy will contact them the morning of delivery.  Patient  stated that his vision is still Blurry. Patient has set up appointment with PCP on 02/29/20 to discuss issue.   [place] Pharmacist Assistant [phone]

## 2020-02-26 ENCOUNTER — Other Ambulatory Visit: Payer: Self-pay | Admitting: Family Medicine

## 2020-02-26 DIAGNOSIS — G629 Polyneuropathy, unspecified: Secondary | ICD-10-CM

## 2020-02-26 DIAGNOSIS — I10 Essential (primary) hypertension: Secondary | ICD-10-CM

## 2020-02-26 MED ORDER — CHLORTHALIDONE 25 MG PO TABS: 25.0000 mg | ORAL_TABLET | Freq: Every day | ORAL | 2 refills | Status: DC

## 2020-02-26 MED ORDER — GABAPENTIN 300 MG PO CAPS: 300.0000 mg | ORAL_CAPSULE | Freq: Three times a day (TID) | ORAL | 2 refills | Status: DC

## 2020-02-29 ENCOUNTER — Ambulatory Visit (INDEPENDENT_AMBULATORY_CARE_PROVIDER_SITE_OTHER): Payer: Medicare HMO | Admitting: Family Medicine

## 2020-02-29 ENCOUNTER — Other Ambulatory Visit: Payer: Self-pay

## 2020-02-29 ENCOUNTER — Encounter: Payer: Self-pay | Admitting: Family Medicine

## 2020-02-29 VITALS — BP 140/70 | HR 65 | Temp 98.1°F | Ht 70.0 in | Wt 193.8 lb

## 2020-02-29 DIAGNOSIS — I10 Essential (primary) hypertension: Secondary | ICD-10-CM | POA: Diagnosis not present

## 2020-02-29 LAB — BASIC METABOLIC PANEL
BUN: 24 mg/dL — ABNORMAL HIGH (ref 6–23)
CO2: 28 mEq/L (ref 19–32)
Calcium: 9 mg/dL (ref 8.4–10.5)
Chloride: 100 mEq/L (ref 96–112)
Creatinine, Ser: 1.3 mg/dL (ref 0.40–1.50)
GFR: 51.82 mL/min — ABNORMAL LOW (ref >60.00)
Glucose, Bld: 116 mg/dL — ABNORMAL HIGH (ref 70–99)
Potassium: 3.7 mEq/L (ref 3.5–5.1)
Sodium: 137 mEq/L (ref 135–145)

## 2020-02-29 MED ORDER — SPIRONOLACTONE 25 MG PO TABS: 25.0000 mg | ORAL_TABLET | Freq: Every day | ORAL | 1 refills | Status: DC

## 2020-02-29 MED ORDER — CLONIDINE HCL 0.1 MG PO TABS: ORAL_TABLET | ORAL | 3 refills | Status: DC

## 2020-02-29 NOTE — Progress Notes (Signed)
Established Patient Office Visit  Subjective:  Patient ID: [name], male    DOB: Sep 17, 1927  Age: 86 y.o. MRN: [id number]  CC:  Chief Complaint  Patient presents with  . Follow-up    concerns about medication causing blured vision. Also having dry mouth sometimes have thick mucus.     HPI EPIC TRIBBETT presents for follow-up of his blood pressure and consideration of possible clonidine side effect.  Reports issues with dry mouth, thick postnasal drip and some mildly blurred vision in both eyes.  History of branch retinal vein occlusion in OS.  This issue has been stable.  Blood pressure has been well controlled with the clonidine, amlodipine and chlorthalidone combination.  Past Medical History:  Diagnosis Date  . Branch retinal vein occlusion of left eye   . High cholesterol   . Hypertension   . TIA (transient ischemic attack)     Past Surgical History:  Procedure Laterality Date  . CAROTID ENDARTERECTOMY Right   . HERNIA REPAIR    . INNER EAR SURGERY Right    for hearing loss    Family History  Problem Relation Age of Onset  . Lung cancer Father     Social History   Socioeconomic History  . Marital status: Widowed    Spouse name: Not on file  . Number of children: 4  . Years of education: Not on file  . 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 Smoking status: Never Smoker  . Smokeless tobacco: Never Used  Substance and Sexual Activity  . Alcohol use: Yes    Comment: 1 glass wine  . Drug use: Never  . Sexual activity: Not on file  Other Topics Concern  . Not on file  Social History Narrative   Lives alone   Social Determinants of Health   Financial Resource Strain: Low Risk   . Difficulty of Paying Living Expenses: Not hard at all  Food Insecurity: No Food Insecurity  . Worried About Charity fundraiser in the Last Year: Never true  . Ran Out of Food in  the Last Year: Never true  Transportation Needs: No Transportation Needs  . Lack of Transportation (Medical): No  . Lack of Transportation (Non-Medical): No  Physical Activity:   . Days of Exercise per Week:   . Minutes of Exercise per Session:   Stress:   . Feeling of Stress :   Social Connections:   . Frequency of Communication with Friends and Family:   . Frequency of Social Gatherings with Friends and Family:   . Attends Religious Services:   . Active Member of Clubs or Organizations:   . Attends Archivist Meetings:   [name] Marital Status:   Intimate Partner Violence:   . Fear of Current or Ex-Partner:   . Emotionally Abused:   [name] Physically Abused:   . Sexually Abused:     Outpatient Medications Prior to Visit  Medication Sig Dispense Refill  . acetaminophen (TYLENOL) 500 MG tablet Take 500 mg by mouth every 6 (six) hours as needed.    [name] amLODipine (NORVASC) 10 MG tablet Take 1 tablet (10 mg total) by mouth daily. 90 tablet 0  . chlorthalidone (HYGROTON) 25 MG tablet TAKE ONE TABLET BY MOUTH EVERY MORNING 30 tablet 2  . Cholecalciferol (VITAMIN D3) 2000 units TABS Take 1 tablet by mouth daily.    . clopidogrel (  PLAVIX) 75 MG tablet Take 1 tablet (75 mg total) by mouth daily. 90 tablet 1  . gabapentin (NEURONTIN) 300 MG capsule TAKE ONE CAPSULE BY MOUTH EVERY MORNING and TAKE ONE CAPSULE BY MOUTH EVERY EVENING and TAKE ONE CAPSULE BY MOUTH EVERYDAY AT BEDTIME 90 capsule 4  . simvastatin (ZOCOR) 10 MG tablet Take 1 tablet (10 mg total) by mouth at bedtime. 90 tablet 3  . vitamin C (ASCORBIC ACID) 500 MG tablet Take 500 mg by mouth daily.    . cloNIDine (CATAPRES - DOSED IN MG/24 HR) 0.1 mg/24hr patch Place 1 patch (0.1 mg total) onto the skin once a week. 4 patch 12  . vitamin B-12 (CYANOCOBALAMIN) 500 MCG tablet Take 500 mcg by mouth daily. (Patient not taking: Reported on 02/26/2020)     No facility-administered medications prior to visit.    Allergies  Allergen  Reactions  . Codeine Other (See Comments)    No reaction noted  . Lisinopril     hyperkalemia  . Penicillins     REACTION: rash    ROS Review of Systems  Constitutional: Negative.  Negative for fever and unexpected weight change.  HENT: Negative.   Eyes: Positive for visual disturbance. Negative for photophobia.  Respiratory: Negative.   Cardiovascular: Negative.   Gastrointestinal: Negative.   Endocrine: Positive for polyphagia. Negative for polyuria.  Genitourinary: Negative.   Musculoskeletal: Negative for gait problem and myalgias.  Skin: Negative for pallor and rash.  Allergic/Immunologic: Negative for immunocompromised state.  Neurological: Negative for light-headedness and numbness.  Hematological: Does not bruise/bleed easily.  Psychiatric/Behavioral: Negative.       Objective:    Physical Exam Vitals and nursing note reviewed.  Constitutional:      General: He is not in acute distress.    Appearance: Normal appearance. He is not ill-appearing, toxic-appearing or diaphoretic.  HENT:     Head: Normocephalic and atraumatic.     Right Ear: External ear normal.     Left Ear: External ear normal.  Eyes:     General: No scleral icterus.       Right eye: No discharge.        Left eye: No discharge.     Conjunctiva/sclera: Conjunctivae normal.  Cardiovascular:     Rate and Rhythm: Normal rate and regular rhythm.  Pulmonary:     Effort: Pulmonary effort is normal.     Breath sounds: Normal breath sounds.  Skin:    General: Skin is warm and dry.  Neurological:     Mental Status: He is alert and oriented to person, place, and time.  Psychiatric:        Mood and Affect: Mood normal.        Behavior: Behavior normal.     BP (!) 140/70   Pulse 65   Temp 98.1 F (36.7 C) (Tympanic)   Ht 5\' 10"  (1.778 m)   Wt 193 lb 12.8 oz (87.9 kg)   SpO2 95%   BMI 27.81 kg/m  Wt Readings from Last 3 Encounters:  02/29/20 193 lb 12.8 oz (87.9 kg)  01/01/20 198 lb 6.4 oz  (90 kg)  12/24/19 199 lb 3.2 oz (90.4 kg)     Health Maintenance Due  Topic Date Due  . COVID-19 Vaccine (1) Never done  . INFLUENZA VACCINE  02/28/2020    There are no preventive care reminders to display for this patient.  Lab Results  Component Value Date   TSH 2.650 04/04/2018   Lab Results  Component Value Date   WBC 7.2 05/15/2019   HGB 14.4 05/15/2019   HCT 44.8 05/15/2019   MCV 85.9 05/15/2019   PLT 231.0 05/15/2019   Lab Results  Component Value Date   NA 135 01/01/2020   K 4.1 01/01/2020   CO2 26 01/01/2020   GLUCOSE 119 (H) 01/01/2020   BUN 25 (H) 01/01/2020   CREATININE 1.27 01/01/2020   BILITOT 0.6 05/15/2019   ALKPHOS 112 05/15/2019   AST 13 05/15/2019   ALT 9 05/15/2019   PROT 6.7 05/15/2019   ALBUMIN 4.0 05/15/2019   CALCIUM 9.2 01/01/2020   GFR 53.25 (L) 01/01/2020   Lab Results  Component Value Date   CHOL 133 10/26/2019   Lab Results  Component Value Date   HDL 34.80 (L) 10/26/2019   Lab Results  Component Value Date   LDLCALC 74 10/26/2019   Lab Results  Component Value Date   TRIG 120.0 10/26/2019   Lab Results  Component Value Date   CHOLHDL 4 10/26/2019   No results found for: HGBA1C    Assessment & Plan:   Problem List Items Addressed This Visit      Cardiovascular and Mediastinum   Essential hypertension - Primary   Relevant Medications   cloNIDine (CATAPRES) 0.1 MG tablet   spironolactone (ALDACTONE) 25 MG tablet   Other Relevant Orders   Basic metabolic panel      Meds ordered this encounter  Medications  . cloNIDine (CATAPRES) 0.1 MG tablet    Sig: Remove patch. Take one tablet daily for a week, then take one every other day for a week, then one every third day for a week and then stop.    Dispense:  20 tablet    Refill:  3  . spironolactone (ALDACTONE) 25 MG tablet    Sig: Take 1 tablet (25 mg total) by mouth daily.    Dispense:  30 tablet    Refill:  1    Follow-up: Return in about 1 month (around  03/31/2020), or follow directions for clonidine taper..   Clonidine taper.  Continue amlodipine chlorthalidone and have added Aldactone.  Follow-up in 1 month. Libby Maw, MD

## 2020-03-25 ENCOUNTER — Telehealth: Payer: Self-pay

## 2020-03-25 DIAGNOSIS — I10 Essential (primary) hypertension: Secondary | ICD-10-CM

## 2020-03-25 DIAGNOSIS — E7800 Pure hypercholesterolemia, unspecified: Secondary | ICD-10-CM

## 2020-03-25 NOTE — Progress Notes (Addendum)
Chronic Care Management Pharmacy Assistant   Name: [name]  MRN: [id number] DOB: Aug 11, 1927  Reason for Encounter: Medication Review   PCP : Libby Maw, MD  Allergies:   Allergies  Allergen Reactions  . Codeine Other (See Comments)    No reaction noted  . Lisinopril     hyperkalemia  . Penicillins     REACTION: rash    Medications: Outpatient Encounter Medications as of 03/25/2020  Medication Sig  . acetaminophen (TYLENOL) 500 MG tablet Take 500 mg by mouth every 6 (six) hours as needed.  [name] amLODipine (NORVASC) 10 MG tablet Take 1 tablet (10 mg total) by mouth daily.  . chlorthalidone (HYGROTON) 25 MG tablet TAKE ONE TABLET BY MOUTH EVERY MORNING  . Cholecalciferol (VITAMIN D3) 2000 units TABS Take 1 tablet by mouth daily.  . cloNIDine (CATAPRES) 0.1 MG tablet Remove patch. Take one tablet daily for a week, then take one every other day for a week, then one every third day for a week and then stop.  . clopidogrel (PLAVIX) 75 MG tablet Take 1 tablet (75 mg total) by mouth daily.  [name] gabapentin (NEURONTIN) 300 MG capsule TAKE ONE CAPSULE BY MOUTH EVERY MORNING and TAKE ONE CAPSULE BY MOUTH EVERY EVENING and TAKE ONE CAPSULE BY MOUTH EVERYDAY AT BEDTIME  . simvastatin (ZOCOR) 10 MG tablet Take 1 tablet (10 mg total) by mouth at bedtime.  [name] spironolactone (ALDACTONE) 25 MG tablet Take 1 tablet (25 mg total) by mouth daily.  . vitamin C (ASCORBIC ACID) 500 MG tablet Take 500 mg by mouth daily.   No facility-administered encounter medications on file as of 03/25/2020.    Current Diagnosis: Patient Active Problem List   Diagnosis Date Noted  . Near syncope 12/24/2019  . Abnormal CT of the chest 05/22/2019  . Vitamin D deficiency 05/22/2019  . Edema 01/27/2019  . Bilateral rales 01/27/2019  . Weight gain 01/01/2019  . Elevated cholesterol 05/19/2018  . Stage 3a chronic kidney disease 04/17/2018  . Hyperkalemia 04/04/2018  . Bradycardia 03/17/2018  . PND  (post-nasal drip) 12/10/2017  . Neuropathy 12/05/2017  . Transient cerebral ischemia 10/06/2007  . Carotid disease, bilateral ([place]) 01/27/2007  . PAIN IN JOINT, MULTIPLE SITES 01/09/2007  . [place] OCCLUSION 11/19/2006  . Hearing loss 11/19/2006  . Essential hypertension 11/19/2006  . PEPTIC ULCER DISEASE 11/19/2006  . HEADACHE 11/19/2006    Goals Addressed   None     Follow-Up:  Pharmacist Review   Reviewed chart for medication changes ahead of medication coordination call.  OVs, Consults, or hospital visits since last care coordination call/Pharmacist visit.   02/29/2020 PCP Kremer,William. Stopped Clonidine patch, started Clonidine 0.1mg  tablet one tablet daily for a week, then take one every other day for a week, then one every third day for a week and then stop. Started spironolactone 25 tablet Daily  BP Readings from Last 3 Encounters:  02/29/20 (!) 140/70  01/07/20 (!) 142/82  01/01/20 (!) 142/64    No results found for: HGBA1C   Patient reports home BP: 8/26 at 6:00 PM - 133/79   Patient obtains medications through Adherence Packaging  30 Days   Last adherence delivery included: (medication name and frequency)  Clopidogrel 75 mg daily - breakfast              Gabapentin 300 mg one tablet three times daily - breakfast, evening meal, bedtime  Simvastatin 10 mg daily - bedtime              Chlorthalidone 25 mg daily - breakfast              Vitamin D3 2000 units daily - breakfast              Vitamin C 500 mg daily - breakfast              Amlodipine 10 mg daily - breakfast    Patient declined (meds) last month due to PRN use/additional supply on hand.  Vitamin B complex (patient no longer taking)             Patient is due for next adherence delivery on: 04/01/2020. Called patient and reviewed medications and coordinated delivery.  This delivery to include:  Clopidogrel 75 mg daily - breakfast              Gabapentin 300 mg one tablet  three times daily - breakfast, evening meal, bedtime              Simvastatin 10 mg daily - bedtime              Chlorthalidone 25 mg daily - breakfast              Vitamin D3 2000 units daily - breakfast              Vitamin C 500 mg daily - breakfast              Amlodipine 10 mg daily - breakfast   Spironolactone 25 mg daily- Patient requests Vials due to upcoming Cardiology visit on 9/7   Patient declined the following medications (meds) due to (reason)  Clonidine 0.1mg  Tablet - (patient takes last tablet today) Patient needs refills for None ID.  Confirmed delivery date of 04/01/2020, advised patient that pharmacy will contact them the morning of delivery.  [place] Pharmacist Assistant [phone]

## 2020-03-28 ENCOUNTER — Telehealth: Payer: Self-pay | Admitting: Family Medicine

## 2020-03-28 NOTE — Telephone Encounter (Signed)
Patient is calling and waned to speak to someone regarding medication,please advise. CB is [phone]

## 2020-03-29 NOTE — Telephone Encounter (Signed)
Spoke with patient who's calling for refill on medication. Per patient he have gotten everything squared away.

## 2020-04-05 ENCOUNTER — Telehealth: Payer: Medicare HMO

## 2020-04-05 ENCOUNTER — Other Ambulatory Visit: Payer: Self-pay

## 2020-04-05 ENCOUNTER — Ambulatory Visit (INDEPENDENT_AMBULATORY_CARE_PROVIDER_SITE_OTHER): Payer: Medicare HMO | Admitting: Family Medicine

## 2020-04-05 ENCOUNTER — Encounter: Payer: Self-pay | Admitting: Family Medicine

## 2020-04-05 VITALS — BP 136/78 | HR 89 | Temp 97.8°F | Ht 70.0 in | Wt 187.0 lb

## 2020-04-05 DIAGNOSIS — I10 Essential (primary) hypertension: Secondary | ICD-10-CM

## 2020-04-05 DIAGNOSIS — K5901 Slow transit constipation: Secondary | ICD-10-CM | POA: Diagnosis not present

## 2020-04-05 LAB — URINALYSIS, ROUTINE W REFLEX MICROSCOPIC
Bilirubin Urine: NEGATIVE
Hgb urine dipstick: NEGATIVE
Leukocytes,Ua: NEGATIVE
Nitrite: NEGATIVE
RBC / HPF: NONE SEEN (ref 0–?)
Specific Gravity, Urine: 1.025 (ref 1.000–1.030)
Total Protein, Urine: NEGATIVE
Urine Glucose: NEGATIVE
Urobilinogen, UA: 0.2 (ref 0.0–1.0)
pH: 5.5 (ref 5.0–8.0)

## 2020-04-05 MED ORDER — DOCUSATE SODIUM 100 MG PO CAPS: 100.0000 mg | ORAL_CAPSULE | Freq: Two times a day (BID) | ORAL | 0 refills | Status: DC

## 2020-04-05 NOTE — Progress Notes (Signed)
Established Patient Office Visit  Subjective:  Patient ID: [name], male    DOB: 1927/10/10  Age: 86 y.o. MRN: [id number]  CC:  Chief Complaint  Patient presents with  . Follow-up    3 month f/u HTN,fasting today. BP at home 136/60,  patient having some constipation.    HPI BASIM BARTNIK presents for follow-up of his hypertension.  Blood pressure has been well controlled with the addition of spironolactone and discontinuation of clonidine.  He has noted some issues with hard stooling.  He is eating plenty of roughage but does not always hydrate as well as he should he tells me.  Past Medical History:  Diagnosis Date  . Branch retinal vein occlusion of left eye   . High cholesterol   . Hypertension   . TIA (transient ischemic attack)     Past Surgical History:  Procedure Laterality Date  . CAROTID ENDARTERECTOMY Right   . HERNIA REPAIR    . INNER EAR SURGERY Right    for hearing loss    Family History  Problem Relation Age of Onset  . Lung cancer Father     Social History   Socioeconomic History  . Marital status: Widowed    Spouse name: Not on file  . Number of children: 4  . Years of education: Not on file  . 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 Smoking status: Never Smoker  . Smokeless tobacco: Never Used  Substance and Sexual Activity  . Alcohol use: Yes    Comment: 1 glass wine  . Drug use: Never  . Sexual activity: Not on file  Other Topics Concern  . Not on file  Social History Narrative   Lives alone   Social Determinants of Health   Financial Resource Strain: Low Risk   . Difficulty of Paying Living Expenses: Not hard at all  Food Insecurity: No Food Insecurity  . Worried About Charity fundraiser in the Last Year: Never true  . Ran Out of Food in the Last Year: Never true  Transportation Needs: No Transportation Needs  . Lack of  Transportation (Medical): No  . Lack of Transportation (Non-Medical): No  Physical Activity:   . Days of Exercise per Week: Not on file  . Minutes of Exercise per Session: Not on file  Stress:   . Feeling of Stress : Not on file  Social Connections:   . Frequency of Communication with Friends and Family: Not on file  . Frequency of Social Gatherings with Friends and Family: Not on file  . Attends Religious Services: Not on file  . Active Member of Clubs or Organizations: Not on file  . Attends Archivist Meetings: Not on file  . Marital Status: Not on file  Intimate Partner Violence:   . Fear of Current or Ex-Partner: Not on file  . Emotionally Abused: Not on file  . Physically Abused: Not on file  . Sexually Abused: Not on file    Outpatient Medications Prior to Visit  Medication Sig Dispense Refill  . acetaminophen (TYLENOL) 500 MG tablet Take 500 mg by mouth every 6 (six) hours as needed.    [name] amLODipine (NORVASC) 10 MG tablet Take 1 tablet (10 mg total) by mouth daily. 90 tablet 0  . chlorthalidone (HYGROTON) 25 MG tablet TAKE ONE TABLET BY MOUTH EVERY MORNING 30 tablet 2  . Cholecalciferol (  VITAMIN D3) 2000 units TABS Take 1 tablet by mouth daily.    . clopidogrel (PLAVIX) 75 MG tablet Take 1 tablet (75 mg total) by mouth daily. 90 tablet 1  . gabapentin (NEURONTIN) 300 MG capsule TAKE ONE CAPSULE BY MOUTH EVERY MORNING and TAKE ONE CAPSULE BY MOUTH EVERY EVENING and TAKE ONE CAPSULE BY MOUTH EVERYDAY AT BEDTIME 90 capsule 4  . simvastatin (ZOCOR) 10 MG tablet Take 1 tablet (10 mg total) by mouth at bedtime. 90 tablet 3  . spironolactone (ALDACTONE) 25 MG tablet Take 1 tablet (25 mg total) by mouth daily. 30 tablet 1  . vitamin C (ASCORBIC ACID) 500 MG tablet Take 500 mg by mouth daily.    . cloNIDine (CATAPRES) 0.1 MG tablet Remove patch. Take one tablet daily for a week, then take one every other day for a week, then one every third day for a week and then stop.  (Patient not taking: Reported on 04/05/2020) 20 tablet 3   No facility-administered medications prior to visit.    Allergies  Allergen Reactions  . Codeine Other (See Comments)    No reaction noted  . Lisinopril     hyperkalemia  . Penicillins     REACTION: rash    ROS Review of Systems  Constitutional: Negative.   HENT: Negative.   Eyes: Negative for photophobia and visual disturbance.  Respiratory: Negative.   Cardiovascular: Negative.   Gastrointestinal: Positive for constipation. Negative for abdominal pain, anal bleeding and blood in stool.  Endocrine: Negative for polyphagia and polyuria.  Genitourinary: Negative.   Psychiatric/Behavioral: Negative.       Objective:    Physical Exam Vitals and nursing note reviewed.  Constitutional:      General: He is not in acute distress.    Appearance: Normal appearance. He is not ill-appearing, toxic-appearing or diaphoretic.  HENT:     Head: Normocephalic and atraumatic.     Right Ear: External ear normal.     Left Ear: External ear normal.  Eyes:     General: No scleral icterus.       Right eye: No discharge.        Left eye: No discharge.     Extraocular Movements: Extraocular movements intact.     Conjunctiva/sclera: Conjunctivae normal.     Pupils: Pupils are equal, round, and reactive to light.  Cardiovascular:     Rate and Rhythm: Normal rate and regular rhythm.  Pulmonary:     Effort: Pulmonary effort is normal.     Breath sounds: Normal breath sounds.  Abdominal:     General: Bowel sounds are normal.  Musculoskeletal:     Cervical back: No rigidity or tenderness.     Right lower leg: No edema.     Left lower leg: No edema.  Lymphadenopathy:     Cervical: No cervical adenopathy.  Skin:    General: Skin is warm and dry.  Neurological:     Mental Status: He is alert and oriented to person, place, and time.  Psychiatric:        Mood and Affect: Mood normal.        Behavior: Behavior normal.     BP  136/78   Pulse 89   Temp 97.8 F (36.6 C) (Temporal)   Ht 5\' 10"  (1.778 m)   Wt 187 lb (84.8 kg)   SpO2 95%   BMI 26.83 kg/m  Wt Readings from Last 3 Encounters:  04/05/20 187 lb (84.8 kg)  02/29/20 193  lb 12.8 oz (87.9 kg)  01/01/20 198 lb 6.4 oz (90 kg)     Health Maintenance Due  Topic Date Due  . INFLUENZA VACCINE  02/28/2020    There are no preventive care reminders to display for this patient.  Lab Results  Component Value Date   TSH 2.650 04/04/2018   Lab Results  Component Value Date   WBC 7.2 05/15/2019   HGB 14.4 05/15/2019   HCT 44.8 05/15/2019   MCV 85.9 05/15/2019   PLT 231.0 05/15/2019   Lab Results  Component Value Date   NA 137 02/29/2020   K 3.7 02/29/2020   CO2 28 02/29/2020   GLUCOSE 116 (H) 02/29/2020   BUN 24 (H) 02/29/2020   CREATININE 1.30 02/29/2020   BILITOT 0.6 05/15/2019   ALKPHOS 112 05/15/2019   AST 13 05/15/2019   ALT 9 05/15/2019   PROT 6.7 05/15/2019   ALBUMIN 4.0 05/15/2019   CALCIUM 9.0 02/29/2020   GFR 51.82 (L) 02/29/2020   Lab Results  Component Value Date   CHOL 133 10/26/2019   Lab Results  Component Value Date   HDL 34.80 (L) 10/26/2019   Lab Results  Component Value Date   LDLCALC 74 10/26/2019   Lab Results  Component Value Date   TRIG 120.0 10/26/2019   Lab Results  Component Value Date   CHOLHDL 4 10/26/2019   No results found for: HGBA1C    Assessment & Plan:   Problem List Items Addressed This Visit      Cardiovascular and Mediastinum   Essential hypertension - Primary   Relevant Orders   Urinalysis, Routine w reflex microscopic     Digestive   Slow transit constipation      No orders of the defined types were placed in this encounter.   Follow-up: Return in about 3 months (around 07/05/2020), or take colace twice daily and hydrate well..   Continue amlodipine, Aldactone and chlorthalidone for hypertension.  Please hydrate well and take Colace twice daily.  Continue all other  medications as directed. Libby Maw, MD

## 2020-04-08 ENCOUNTER — Telehealth: Payer: Medicare HMO

## 2020-04-11 DIAGNOSIS — Z20828 Contact with and (suspected) exposure to other viral communicable diseases: Secondary | ICD-10-CM | POA: Diagnosis not present

## 2020-04-17 DIAGNOSIS — R69 Illness, unspecified: Secondary | ICD-10-CM | POA: Diagnosis not present

## 2020-04-18 ENCOUNTER — Other Ambulatory Visit: Payer: Self-pay | Admitting: Family Medicine

## 2020-04-18 DIAGNOSIS — I10 Essential (primary) hypertension: Secondary | ICD-10-CM

## 2020-04-18 NOTE — Telephone Encounter (Signed)
Pt due for the 3 month follow up with PCP in 06/2020.

## 2020-04-22 ENCOUNTER — Telehealth: Payer: Self-pay

## 2020-04-22 NOTE — Progress Notes (Signed)
Unable to leave a voice message to confirmed patient telephone appointment on 04/25/2020 for CCM at 1:00 PM with [name] the Clinical pharmacist.   [place] Pharmacist Assistant [phone]

## 2020-04-25 ENCOUNTER — Ambulatory Visit: Payer: Medicare HMO

## 2020-04-25 ENCOUNTER — Other Ambulatory Visit: Payer: Self-pay

## 2020-04-25 DIAGNOSIS — I10 Essential (primary) hypertension: Secondary | ICD-10-CM

## 2020-04-25 DIAGNOSIS — E7800 Pure hypercholesterolemia, unspecified: Secondary | ICD-10-CM

## 2020-04-25 MED ORDER — AMLODIPINE BESYLATE 10 MG PO TABS: 10.0000 mg | ORAL_TABLET | Freq: Every day | ORAL | 0 refills | Status: DC

## 2020-04-25 NOTE — Chronic Care Management (AMB) (Signed)
Chronic Care Management Pharmacy  Name: [name]  MRN: [id number] DOB: Nov 21, 1927  Chief Complaint/ HPI  [name],  86 y.o. , male presents for their Follow-Up CCM visit with the clinical pharmacist via telephone.    PCP : Libby Maw, MD  Their chronic conditions include: Hypertension, TIA, chronic Phillips disease, dyslipidemia   Office Visits: 04/05/20: Patient presented to Dr. Ethelene Hal for follow-up. BP in clinic 136/78. Clonidine stopped. Patient started on docusate 100 mg twice daily.  02/29/20: Patient presented to Dr. Ethelene Hal for follow-up. BP in clinic 140/70. Clonidine tapered off, spironolactone stopped. Vitamin B12 stopped.  01/01/20: Patient presented to Dr. Ethelene Hal for HTN follow-up. Patient BP 142/64. Some difficulties with remembering to change the patches.  11/26/19: Patient presented to Dr. Ethelene Hal for HTN follow-up. Patient BP in clinic 162/80. Home BP 150/80-90. Patient experiencing dry mouth with clonidine. Patient reports low energy/stamina. Furosemide increased to 40 mg daily, clonidine decreased to 0.1 mg twice daily.  10/26/19: Patient presented to Dr. Ethelene Hal for HTN follow-up. Patient BP 158/72. Patient tolerating clonidine well, swelling in legs. Clonidine increased to 0.2 mg BID.  Consult Visit: None noted.   Medications: Outpatient Encounter Medications as of 04/25/2020  Medication Sig   amLODipine (NORVASC) 10 MG tablet Take 1 tablet (10 mg total) by mouth daily.   chlorthalidone (HYGROTON) 25 MG tablet TAKE ONE TABLET BY MOUTH EVERY MORNING   Cholecalciferol (VITAMIN D3) 2000 units TABS Take 1 tablet by mouth daily.   clopidogrel (PLAVIX) 75 MG tablet Take 1 tablet (75 mg total) by mouth daily.   gabapentin (NEURONTIN) 300 MG capsule TAKE ONE CAPSULE BY MOUTH EVERY MORNING and TAKE ONE CAPSULE BY MOUTH EVERY EVENING and TAKE ONE CAPSULE BY MOUTH EVERYDAY AT BEDTIME   simvastatin (ZOCOR) 10 MG tablet Take 1 tablet (10 mg total) by mouth at  bedtime.   spironolactone (ALDACTONE) 25 MG tablet TAKE ONE TABLET BY MOUTH daily   vitamin C (ASCORBIC ACID) 500 MG tablet Take 500 mg by mouth daily.   acetaminophen (TYLENOL) 500 MG tablet Take 500 mg by mouth every 6 (six) hours as needed.   docusate sodium (COLACE) 100 MG capsule Take 1 capsule (100 mg total) by mouth 2 (two) times daily.   No facility-administered encounter medications on file as of 04/25/2020.   Current Diagnosis/Assessment:  SDOH Interventions     Most Recent Value  SDOH Interventions  Financial Strain Interventions Intervention Not Indicated  Transportation Interventions Intervention Not Indicated     Goals Addressed            This Visit's Progress    Chronic Care Management       CARE PLAN ENTRY  Current Barriers:   Chronic Disease Management support, education, and care coordination needs related to Hypertension, Hyperlipidemia, and Chronic Phillips Disease   Hypertension  Pharmacist Clinical Goal(s): o Over the next 30 days, patient will work with PharmD and providers to achieve BP goal <140/90  Current regimen:  o Amlodipine 10 mg daily  o Chlorthalidone 25 mg daily o Spironolactone 25 mg daily   Interventions:  o Recommend switching Amlodipine to nightly administration  Patient self care activities - Over the next 30 days, patient will: o Continue checking blood pressure daily, document, and provide at future appointments o Ensure daily salt intake < 2300 mg/day  Hyperlipidemia  Pharmacist Clinical Goal(s): o Over the next 180 days, patient will work with PharmD and providers to maintain LDL goal less than 100  Current regimen:  o Simvastatin 10 mg nightly  Medication management  Pharmacist Clinical Goal(s): o Over the next 90 days, patient will work with PharmD and providers to achieve optimal medication adherence  Current pharmacy: [facility]  Interventions o Comprehensive medication review performed. o Utilize  [facility] for medication synchronization, packaging and delivery.    Patient self care activities - Over the next 90 days, patient will: o Take medications as prescribed o Report any questions or concerns to PharmD and/or provider(s)        Hypertension   BP goal: < 140/90  Office blood pressures are  BP Readings from Last 3 Encounters:  04/05/20 136/78  02/29/20 (!) 140/70  01/07/20 (!) 142/82   CMP Latest Ref Rng & Units 02/29/2020 01/01/2020 10/26/2019  Glucose 70 - 99 mg/dL 116(H) 119(H) 104(H)  BUN 6 - 23 mg/dL 24(H) 25(H) 23  Creatinine 0.40 - 1.50 mg/dL 1.30 1.27 1.17  Sodium 135 - 145 mEq/L 137 135 138  Potassium 3.5 - 5.1 mEq/L 3.7 4.1 4.4  Chloride 96 - 112 mEq/L 100 101 104  CO2 19 - 32 mEq/L 28 26 25   Calcium 8.4 - 10.5 mg/dL 9.0 9.2 8.9  Total Protein 6.0 - 8.3 g/dL - - -  Total Bilirubin 0.2 - 1.2 mg/dL - - -  Alkaline Phos 39 - 117 U/L - - -  AST 0 - 37 U/L - - -  ALT 0 - 53 U/L - - -   Patient has failed these meds in the past: lisinopril (hyperkalemia), HCTZ (Elevated Cr), furosemide (ineffective), Clonidine,  Patient is currently uncontrolled on the following medications:   Amlodipine 10 mg daily (AM)   Spironolactone 25 mg daily (AM)  Chlorthalidone 25 mg daily (AM) Patient checks BP at home daily  Patient home BP readings are ranging: 127/67   We discussed diet and exercise extensively. Diet: Fruit, fish, high potassium diet.does not salt foods. Watches salt content in foods his daughter purchases.   Patient continues to struggle with blurred vision, feels it improved somewhat since stopping clonidine. Patient is maintaining adequate hydration, does have complaints about frequent urination throughout the day now.   Plan  Recommend switching amlodipine to PM administration.   Hyperlipidemia   History of TIA 2009 LDL goal <70  Lipid Panel     Component Value Date/Time   CHOL 133 10/26/2019 1136   TRIG 120.0 10/26/2019 1136   HDL  34.80 (L) 10/26/2019 1136   CHOLHDL 4 10/26/2019 1136   VLDL 24.0 10/26/2019 1136   LDLCALC 74 10/26/2019 1136   LDLDIRECT 88.0 05/15/2019 1137     The ASCVD Risk score (Goff DC Jr., et al., 2013) failed to calculate for the following reasons:   The 2013 ASCVD risk score is only valid for ages 45 to 34   Patient has failed these meds in past: n/a Patient is currently controlled on the following medications:   Clopidogrel 75 mg daily (AM)  Simvastatin 10 mg QHS   We discussed:  diet and exercise extensively  Plan  Continue current medications  Peripheral Neuropathy   Patient has failed these meds in past: n/a Patient is currently controlled on the following medications:  Gabapentin 300 mg TID (AM, EM, 4 AM)   We discussed:  Peripheral neuropathy in feet much improved since patient has been on gabapentin, denies significant CNS depression or sedation.  Plan  Continue current medications  Misc/OTC   Acetaminophen 500 mg q6hr PRN (takes twice daily most  days) Vitamin D3 2000 units daily Vitamin C 500 mg daily  Docusate 100 mg twice daily  Plan  Continue Current Medications  Vaccines   Reviewed and discussed patient's vaccination history.    Immunization History  Administered Date(s) Administered   Fluad Quad(high Dose 65+) 04/15/2019   Influenza Whole 06/19/2007   Influenza, High Dose Seasonal PF 05/02/2018   Influenza-Unspecified 05/13/2017   Moderna SARS-COVID-2 Vaccination 09/30/2019, 10/28/2019   Pneumococcal Conjugate-13 07/31/2015   Pneumococcal Polysaccharide-23 07/31/2015, 10/24/2016    Medication Management   Reviewed chart for medication changes ahead of medication coordination call.  No OVs, Consults, or hospital visits since last care coordination call/Pharmacist visit. (If appropriate, list visit date, provider name)  No medication changes indicated OR if recent visit, treatment plan here.  BP Readings from Last 3 Encounters:    04/05/20 136/78  02/29/20 (!) 140/70  01/07/20 (!) 142/82    No results found for: HGBA1C   Patient obtains medications through Adherence Packaging  30 Days   Last adherence delivery included:  Clopidogrel 75 mg daily -breakfast  Gabapentin 300 mg one tablet three times daily -breakfast, evening meal, bedtime  Simvastatin 10 mg daily -bedtime  Chlorthalidone 25 mg daily -breakfast  Vitamin D3 2000 units daily -breakfast  Vitamin C 500 mg daily -breakfast  Amlodipine 10 mg daily -breakfast              Spironolactone 25 mg daily- Patient requests Vials due to upcoming Cardiology visit on 9/7  Patient is due for next adherence delivery on: 05/02/20. Called patient and reviewed medications and coordinated delivery.  This delivery to include:  Clopidogrel 75 mg daily - breakfast   Gabapentin 300 mg one tablet three times daily - breakfast, evening meal, bedtime   Simvastatin 10 mg daily - bedtime   Chlorthalidone 25 mg daily - breakfast   Vitamin D3 2000 units daily - breakfast   Vitamin C 500 mg daily - breakfast   Amlodipine 10 mg daily - bedtime   Spironolactone 25 mg daily- breakfast   Docusate 100 mg twice daily - Vial  Patient needs refills for Amlodipine 10 mg. Request sent to PCP team.  Confirmed delivery date of 04/29/20, advised patient that pharmacy will contact them the morning of delivery.  Follow up: 6 months   Friend at [facility]  [phone]

## 2020-05-12 ENCOUNTER — Telehealth: Payer: Self-pay

## 2020-05-12 NOTE — Progress Notes (Signed)
Chronic Care Management Pharmacy Assistant   Name: [name]  MRN: [id number] DOB: 07/26/1928  Reason for Encounter: Medication Review   PCP : Libby Maw, MD  Allergies:   Allergies  Allergen Reactions  . Codeine Other (See Comments)    No reaction noted  . Lisinopril     hyperkalemia  . Penicillins     REACTION: rash    Medications: Outpatient Encounter Medications as of 05/12/2020  Medication Sig  . acetaminophen (TYLENOL) 500 MG tablet Take 500 mg by mouth every 6 (six) hours as needed.  [name] amLODipine (NORVASC) 10 MG tablet Take 1 tablet (10 mg total) by mouth daily.  . chlorthalidone (HYGROTON) 25 MG tablet TAKE ONE TABLET BY MOUTH EVERY MORNING  . Cholecalciferol (VITAMIN D3) 2000 units TABS Take 1 tablet by mouth daily.  . clopidogrel (PLAVIX) 75 MG tablet Take 1 tablet (75 mg total) by mouth daily.  [name] docusate sodium (COLACE) 100 MG capsule Take 1 capsule (100 mg total) by mouth 2 (two) times daily.  [name] gabapentin (NEURONTIN) 300 MG capsule TAKE ONE CAPSULE BY MOUTH EVERY MORNING and TAKE ONE CAPSULE BY MOUTH EVERY EVENING and TAKE ONE CAPSULE BY MOUTH EVERYDAY AT BEDTIME  . simvastatin (ZOCOR) 10 MG tablet Take 1 tablet (10 mg total) by mouth at bedtime.  [name] spironolactone (ALDACTONE) 25 MG tablet TAKE ONE TABLET BY MOUTH daily  . vitamin C (ASCORBIC ACID) 500 MG tablet Take 500 mg by mouth daily.   No facility-administered encounter medications on file as of 05/12/2020.    Current Diagnosis: Patient Active Problem List   Diagnosis Date Noted  . Slow transit constipation 04/05/2020  . Near syncope 12/24/2019  . Abnormal CT of the chest 05/22/2019  . Vitamin D deficiency 05/22/2019  . Edema 01/27/2019  . Bilateral rales 01/27/2019  . Weight gain 01/01/2019  . Elevated cholesterol 05/19/2018  . Stage 3a chronic kidney disease ([place]) 04/17/2018  . Hyperkalemia 04/04/2018  . Bradycardia 03/17/2018  . PND (post-nasal drip) 12/10/2017  .  Neuropathy 12/05/2017  . Transient cerebral ischemia 10/06/2007  . Carotid disease, bilateral ([place]) 01/27/2007  . PAIN IN JOINT, MULTIPLE SITES 01/09/2007  . [place] OCCLUSION 11/19/2006  . Hearing loss 11/19/2006  . Essential hypertension 11/19/2006  . PEPTIC ULCER DISEASE 11/19/2006  . HEADACHE 11/19/2006    Follow-Up:  Pharmacist Review   Reviewed chart for medication changes ahead of medication coordination call.  No OVs, Consults, or hospital visits since last care coordination call/Pharmacist visit.   No medication changes indicated.  BP Readings from Last 3 Encounters:  04/05/20 136/78  02/29/20 (!) 140/70  01/07/20 (!) 142/82    No results found for: HGBA1C   Patient obtains medications through Adherence Packaging  30 Days   Last adherence delivery included: (medication name and frequency)  Clopidogrel 75 mg daily -breakfast  Gabapentin 300 mg one tablet three times daily -breakfast, evening meal, bedtime  Simvastatin 10 mg daily -bedtime  Chlorthalidone 25 mg daily -breakfast  Vitamin D3 2000 units daily -breakfast  Vitamin C 500 mg daily -breakfast  Amlodipine 10 mg daily -breakfast              Spironolactone 25 mg daily- Patient requests Vials due to upcoming Cardiology visit on 9/7  Patient declined (meds) last month due to PRN use/additional supply on hand.   Clonidine 0.1mg  Tablet - (patient takes last tablet today)  Patient is due for next adherence delivery on: 06/01/2020. Called patient  and reviewed medications and coordinated delivery.  This delivery to include:  Clopidogrel 75 mg daily -breakfast  Gabapentin 300 mg one tablet three times daily -breakfast, evening meal, bedtime  Simvastatin 10 mg daily -bedtime  Chlorthalidone 25 mg daily -breakfast  Vitamin D3 2000 units daily -breakfast   Vitamin C 500 mg daily -breakfast  Amlodipine 10 mg daily -breakfast              Spironolactone 25 mg daily-breakfast  Docusate sodium 100 mg twice daily- breakfast, evening meal  Patient declined the following medications (meds) due to (reason)  Acetaminophen 500 mg Tablet-OTC  Patient needs refills for Clopidogrel 75 mg daily.  Confirmed delivery date of 06/01/2020, advised patient that pharmacy will contact them the morning of delivery.  Blood pressure readings:  On 05/10/2020 at 2:17pm it was 144/77 with a pulse of 53.  On 10/12/20201 at 2:18pm it was 147/76 with a pulse of 52.(patient states he retook his blood pressure again because the cuff was not on his arm correctly).  On 05/09/2020 at 12:13pm it was 138/68 with a pulse of 56.  On 05/05/2020 at 7:57pm it was 134/69 with a pulse of 53.  On 05/03/2020 at 8:01pm it was 139/73 with a pulse of 61.   [place] Pharmacist Assistant [phone]

## 2020-05-16 ENCOUNTER — Other Ambulatory Visit: Payer: Self-pay | Admitting: Family Medicine

## 2020-05-16 DIAGNOSIS — G459 Transient cerebral ischemic attack, unspecified: Secondary | ICD-10-CM

## 2020-05-19 DIAGNOSIS — H43813 Vitreous degeneration, bilateral: Secondary | ICD-10-CM | POA: Diagnosis not present

## 2020-05-19 DIAGNOSIS — H34832 Tributary (branch) retinal vein occlusion, left eye, with macular edema: Secondary | ICD-10-CM | POA: Diagnosis not present

## 2020-05-19 DIAGNOSIS — Z7952 Long term (current) use of systemic steroids: Secondary | ICD-10-CM | POA: Diagnosis not present

## 2020-05-19 DIAGNOSIS — Z961 Presence of intraocular lens: Secondary | ICD-10-CM | POA: Diagnosis not present

## 2020-06-16 DIAGNOSIS — H34832 Tributary (branch) retinal vein occlusion, left eye, with macular edema: Secondary | ICD-10-CM | POA: Diagnosis not present

## 2020-06-16 DIAGNOSIS — Z961 Presence of intraocular lens: Secondary | ICD-10-CM | POA: Diagnosis not present

## 2020-06-16 DIAGNOSIS — H43813 Vitreous degeneration, bilateral: Secondary | ICD-10-CM | POA: Diagnosis not present

## 2020-06-19 ENCOUNTER — Other Ambulatory Visit: Payer: Self-pay | Admitting: Family Medicine

## 2020-06-19 DIAGNOSIS — I10 Essential (primary) hypertension: Secondary | ICD-10-CM

## 2020-06-22 ENCOUNTER — Telehealth: Payer: Self-pay

## 2020-06-22 NOTE — Progress Notes (Signed)
Chronic Care Management Pharmacy Assistant   Name: [name]  MRN: [id number] DOB: [birth year]  Reason for Encounter: Medication Review  PCP : Libby Maw, MD  Allergies:   Allergies  Allergen Reactions  . Codeine Other (See Comments)    No reaction noted  . Lisinopril     hyperkalemia  . Penicillins     REACTION: rash    Medications: Outpatient Encounter Medications as of 06/22/2020  Medication Sig  . acetaminophen (TYLENOL) 500 MG tablet Take 500 mg by mouth every 6 (six) hours as needed.  [name] amLODipine (NORVASC) 10 MG tablet Take 1 tablet (10 mg total) by mouth daily.  . chlorthalidone (HYGROTON) 25 MG tablet TAKE ONE TABLET BY MOUTH EVERY MORNING  . Cholecalciferol (VITAMIN D3) 2000 units TABS Take 1 tablet by mouth daily.  . clopidogrel (PLAVIX) 75 MG tablet TAKE ONE TABLET BY MOUTH EVERY MORNING  . docusate sodium (COLACE) 100 MG capsule Take 1 capsule (100 mg total) by mouth 2 (two) times daily.  [name] gabapentin (NEURONTIN) 300 MG capsule TAKE ONE CAPSULE BY MOUTH EVERY MORNING and TAKE ONE CAPSULE BY MOUTH EVERY EVENING and TAKE ONE CAPSULE BY MOUTH EVERYDAY AT BEDTIME  . simvastatin (ZOCOR) 10 MG tablet Take 1 tablet (10 mg total) by mouth at bedtime.  [name] spironolactone (ALDACTONE) 25 MG tablet TAKE ONE TABLET BY MOUTH EVERY MORNING  . vitamin C (ASCORBIC ACID) 500 MG tablet Take 500 mg by mouth daily.   No facility-administered encounter medications on file as of 06/22/2020.    Current Diagnosis: Patient Active Problem List   Diagnosis Date Noted  . Slow transit constipation 04/05/2020  . Near syncope 12/24/2019  . Abnormal CT of the chest 05/22/2019  . Vitamin D deficiency 05/22/2019  . Edema 01/27/2019  . Bilateral rales 01/27/2019  . Weight gain 01/01/2019  . Elevated cholesterol 05/19/2018  . Stage 3a chronic kidney disease ([place]) 04/17/2018  . Hyperkalemia 04/04/2018  . Bradycardia 03/17/2018  . PND (post-nasal drip) 12/10/2017  .  Neuropathy 12/05/2017  . Transient cerebral ischemia 10/06/2007  . Carotid disease, bilateral ([place]) 01/27/2007  . PAIN IN JOINT, MULTIPLE SITES 01/09/2007  . [place] OCCLUSION 11/19/2006  . Hearing loss 11/19/2006  . Essential hypertension 11/19/2006  . PEPTIC ULCER DISEASE 11/19/2006  . HEADACHE 11/19/2006    Follow-Up:  Pharmacist Review   Reviewed chart for medication changes ahead of medication coordination call.  OVs, Consults, or hospital visits since last care coordination call/Pharmacist visit.  05/19/2020 Ophthalmology [place]  Medication changes indicated     BP Readings from Last 3 Encounters:  04/05/20 136/78  02/29/20 (!) 140/70  01/07/20 (!) 142/82    No results found for: HGBA1C   Patient obtains medications through Adherence Packaging  30 Days   Last adherence delivery included: (medication name and frequency)    Clopidogrel 75 mg daily -breakfast  Gabapentin 300 mg one tablet three times daily -breakfast, evening meal, bedtime  Simvastatin 10 mg daily -bedtime  Chlorthalidone 25 mg daily -breakfast  Vitamin D3 2000 units daily -breakfast  Vitamin C 500 mg daily -breakfast  Amlodipine 10 mg daily -breakfast Spironolactone 25 mg daily-breakfast             Docusate sodium 100 mg twice daily- breakfast, evening meal  Patient declined (meds) last month due to PRN use/additional supply on hand.      Acetaminophen 500 mg Tablet-OTC   Patient is due for next adherence delivery on:  07/01/2020. Called patient and reviewed medications and coordinated delivery.  This delivery to include:    Clopidogrel 75 mg daily -breakfast  Gabapentin 300 mg one tablet three times daily -breakfast, evening meal, bedtime  Simvastatin 10 mg daily -bedtime  Chlorthalidone 25 mg daily -breakfast  Vitamin D3  2000 units daily -breakfast  Vitamin C 500 mg daily -breakfast  Amlodipine 10 mg daily -breakfast Spironolactone 25 mg daily-breakfast             Docusate sodium 100 mg twice daily- breakfast, evening meal  Patient declined the following medications (meds) due to (reason)   Acetaminophen 500 mg Tablet-OTC  Patient needs refills for None ID.  Confirmed delivery date of 07/01/2020, advised patient that pharmacy will contact them the morning of delivery.  [place] Pharmacist Assistant [phone]

## 2020-06-28 ENCOUNTER — Telehealth: Payer: Self-pay

## 2020-06-28 NOTE — Progress Notes (Signed)
    Chronic Care Management Pharmacy Assistant   Name: [name]  MRN: [id number] DOB: 06/02/1928  Reason for Encounter: Medication Review   PCP : Libby Maw, MD  Allergies:   Allergies  Allergen Reactions  . Codeine Other (See Comments)    No reaction noted  . Lisinopril     hyperkalemia  . Penicillins     REACTION: rash    Medications: Outpatient Encounter Medications as of 06/28/2020  Medication Sig  . acetaminophen (TYLENOL) 500 MG tablet Take 500 mg by mouth every 6 (six) hours as needed.  [name] amLODipine (NORVASC) 10 MG tablet Take 1 tablet (10 mg total) by mouth daily.  . chlorthalidone (HYGROTON) 25 MG tablet TAKE ONE TABLET BY MOUTH EVERY MORNING  . Cholecalciferol (VITAMIN D3) 2000 units TABS Take 1 tablet by mouth daily.  . clopidogrel (PLAVIX) 75 MG tablet TAKE ONE TABLET BY MOUTH EVERY MORNING  . docusate sodium (COLACE) 100 MG capsule Take 1 capsule (100 mg total) by mouth 2 (two) times daily.  [name] gabapentin (NEURONTIN) 300 MG capsule TAKE ONE CAPSULE BY MOUTH EVERY MORNING and TAKE ONE CAPSULE BY MOUTH EVERY EVENING and TAKE ONE CAPSULE BY MOUTH EVERYDAY AT BEDTIME  . simvastatin (ZOCOR) 10 MG tablet Take 1 tablet (10 mg total) by mouth at bedtime.  [name] spironolactone (ALDACTONE) 25 MG tablet TAKE ONE TABLET BY MOUTH EVERY MORNING  . vitamin C (ASCORBIC ACID) 500 MG tablet Take 500 mg by mouth daily.   No facility-administered encounter medications on file as of 06/28/2020.    Current Diagnosis: Patient Active Problem List   Diagnosis Date Noted  . Slow transit constipation 04/05/2020  . Near syncope 12/24/2019  . Abnormal CT of the chest 05/22/2019  . Vitamin D deficiency 05/22/2019  . Edema 01/27/2019  . Bilateral rales 01/27/2019  . Weight gain 01/01/2019  . Elevated cholesterol 05/19/2018  . Stage 3a chronic kidney disease ([place]) 04/17/2018  . Hyperkalemia 04/04/2018  . Bradycardia 03/17/2018  . PND (post-nasal drip) 12/10/2017  .  Neuropathy 12/05/2017  . Transient cerebral ischemia 10/06/2007  . Carotid disease, bilateral ([place]) 01/27/2007  . PAIN IN JOINT, MULTIPLE SITES 01/09/2007  . [place] OCCLUSION 11/19/2006  . Hearing loss 11/19/2006  . Essential hypertension 11/19/2006  . PEPTIC ULCER DISEASE 11/19/2006  . HEADACHE 11/19/2006     Follow-Up:  Medication Cost Review and Pharmacist Review   Reviewed chart and adherence measures. Per insurance data patient is 100 % adherent to Simvastatin . Patient does meet goal for Simvastatin.  [place] Pharmacist Assistant [phone]

## 2020-07-07 ENCOUNTER — Ambulatory Visit: Payer: Medicare HMO | Admitting: Family Medicine

## 2020-07-13 ENCOUNTER — Telehealth: Payer: Self-pay

## 2020-07-13 DIAGNOSIS — I10 Essential (primary) hypertension: Secondary | ICD-10-CM

## 2020-07-13 MED ORDER — AMLODIPINE BESYLATE 10 MG PO TABS: 10.0000 mg | ORAL_TABLET | Freq: Every day | ORAL | 0 refills | Status: DC

## 2020-07-13 NOTE — Progress Notes (Signed)
Chronic Care Management Pharmacy Assistant   Name: [name]  MRN: [id number] DOB: November 05, 1927  Reason for Encounter: Medication Review  PCP : Libby Maw, MD  Allergies:   Allergies  Allergen Reactions  . Codeine Other (See Comments)    No reaction noted  . Lisinopril     hyperkalemia  . Penicillins     REACTION: rash    Medications: Outpatient Encounter Medications as of 07/13/2020  Medication Sig  . acetaminophen (TYLENOL) 500 MG tablet Take 500 mg by mouth every 6 (six) hours as needed.  [name] amLODipine (NORVASC) 10 MG tablet Take 1 tablet (10 mg total) by mouth daily.  . chlorthalidone (HYGROTON) 25 MG tablet TAKE ONE TABLET BY MOUTH EVERY MORNING  . Cholecalciferol (VITAMIN D3) 2000 units TABS Take 1 tablet by mouth daily.  . clopidogrel (PLAVIX) 75 MG tablet TAKE ONE TABLET BY MOUTH EVERY MORNING  . docusate sodium (COLACE) 100 MG capsule Take 1 capsule (100 mg total) by mouth 2 (two) times daily.  [name] gabapentin (NEURONTIN) 300 MG capsule TAKE ONE CAPSULE BY MOUTH EVERY MORNING and TAKE ONE CAPSULE BY MOUTH EVERY EVENING and TAKE ONE CAPSULE BY MOUTH EVERYDAY AT BEDTIME  . simvastatin (ZOCOR) 10 MG tablet Take 1 tablet (10 mg total) by mouth at bedtime.  [name] spironolactone (ALDACTONE) 25 MG tablet TAKE ONE TABLET BY MOUTH EVERY MORNING  . vitamin C (ASCORBIC ACID) 500 MG tablet Take 500 mg by mouth daily.   No facility-administered encounter medications on file as of 07/13/2020.    Current Diagnosis: Patient Active Problem List   Diagnosis Date Noted  . Slow transit constipation 04/05/2020  . Near syncope 12/24/2019  . Abnormal CT of the chest 05/22/2019  . Vitamin D deficiency 05/22/2019  . Edema 01/27/2019  . Bilateral rales 01/27/2019  . Weight gain 01/01/2019  . Elevated cholesterol 05/19/2018  . Stage 3a chronic kidney disease ([place]) 04/17/2018  . Hyperkalemia 04/04/2018  . Bradycardia 03/17/2018  . PND (post-nasal drip) 12/10/2017  .  Neuropathy 12/05/2017  . Transient cerebral ischemia 10/06/2007  . Carotid disease, bilateral ([place]) 01/27/2007  . PAIN IN JOINT, MULTIPLE SITES 01/09/2007  . [place] OCCLUSION 11/19/2006  . Hearing loss 11/19/2006  . Essential hypertension 11/19/2006  . PEPTIC ULCER DISEASE 11/19/2006  . HEADACHE 11/19/2006     Follow-Up:  Pharmacist Review   Reviewed chart for medication changes ahead of medication coordination call.  No OVs, Consults, or hospital visits since last care coordination call/Pharmacist visit.  No medication changes indicated  BP Readings from Last 3 Encounters:  04/05/20 136/78  02/29/20 (!) 140/70  01/07/20 (!) 142/82    No results found for: HGBA1C   Patient obtains medications through Adherence Packaging  30 Days   Last adherence delivery included:   Clopidogrel 75 mg daily -breakfast  Gabapentin 300 mg one tablet three times daily -breakfast, evening meal, bedtime  Simvastatin 10 mg daily -bedtime  Chlorthalidone 25 mg daily -breakfast  Vitamin D3 2000 units daily -breakfast  Vitamin C 500 mg daily -breakfast  Amlodipine 10 mg daily -breakfast Spironolactone 25 mg daily-breakfast Docusate sodium 100 mg twice daily- breakfast, evening meal  Patient declined (meds) last month due to PRN use/additional supply on hand.  Acetaminophen 500 mg Tablet-OTC  Patient is due for next adherence delivery on: 07/28/2020. Called patient and reviewed medications and coordinated delivery.  This delivery to include:  Clopidogrel 75 mg daily -breakfast  Gabapentin 300 mg one tablet three times  daily -breakfast, evening meal, bedtime  Simvastatin 10 mg daily -bedtime  Chlorthalidone 25 mg daily -breakfast  Vitamin D3 2000 units daily -breakfast  Vitamin C 500 mg daily -breakfast   Amlodipine 10 mg daily -breakfast Spironolactone 25 mg daily-breakfast Docusate sodium 100 mg twice daily- breakfast, evening meal  Patient declined the following medications (meds) due to (reason)  Acetaminophen 500 mg Tablet-OTC  Patient needs refills for Amlodipine 10 mg Reached out to PCP for refill on 07/13/2020.  Confirmed delivery date of 07/28/2020, advised patient that pharmacy will contact them the morning of delivery.  [place] Pharmacist Assistant [phone]

## 2020-07-13 NOTE — Telephone Encounter (Signed)
Last OV 04/05/20 Last fill 04/25/20  #90/0

## 2020-08-08 ENCOUNTER — Ambulatory Visit: Payer: Medicare HMO | Admitting: Family Medicine

## 2020-08-14 ENCOUNTER — Other Ambulatory Visit: Payer: Self-pay | Admitting: Family Medicine

## 2020-08-14 ENCOUNTER — Other Ambulatory Visit: Payer: Self-pay | Admitting: Family

## 2020-08-14 DIAGNOSIS — G629 Polyneuropathy, unspecified: Secondary | ICD-10-CM

## 2020-08-14 DIAGNOSIS — I10 Essential (primary) hypertension: Secondary | ICD-10-CM

## 2020-08-15 ENCOUNTER — Telehealth: Payer: Self-pay

## 2020-08-15 NOTE — Progress Notes (Addendum)
Chronic Care Management Pharmacy Assistant   Name: [name]  MRN: [id number] DOB: [birth year]  Reason for Encounter: Medication Review  Patient Questions:  1.  Have you seen any other providers since your last visit? No  2.  Any changes in your medicines or health? No    PCP : Libby Maw, MD  Allergies:   Allergies  Allergen Reactions   Codeine Other (See Comments)    No reaction noted   Lisinopril     hyperkalemia   Penicillins     REACTION: rash    Medications: Outpatient Encounter Medications as of 08/15/2020  Medication Sig   acetaminophen (TYLENOL) 500 MG tablet Take 500 mg by mouth every 6 (six) hours as needed.   amLODipine (NORVASC) 10 MG tablet Take 1 tablet (10 mg total) by mouth daily.   chlorthalidone (HYGROTON) 25 MG tablet TAKE ONE TABLET BY MOUTH EVERY MORNING   Cholecalciferol (VITAMIN D3) 2000 units TABS Take 1 tablet by mouth daily.   clopidogrel (PLAVIX) 75 MG tablet TAKE ONE TABLET BY MOUTH EVERY MORNING   docusate sodium (COLACE) 100 MG capsule Take 1 capsule (100 mg total) by mouth 2 (two) times daily.   gabapentin (NEURONTIN) 300 MG capsule TAKE ONE CAPSULE BY MOUTH EVERY MORNING and TAKE ONE CAPSULE BY MOUTH EVERY EVENING and TAKE ONE CAPSULE BY MOUTH EVERYDAY AT BEDTIME   simvastatin (ZOCOR) 10 MG tablet Take 1 tablet (10 mg total) by mouth at bedtime.   spironolactone (ALDACTONE) 25 MG tablet TAKE ONE TABLET BY MOUTH EVERY MORNING   vitamin C (ASCORBIC ACID) 500 MG tablet Take 500 mg by mouth daily.   No facility-administered encounter medications on file as of 08/15/2020.    Current Diagnosis: Patient Active Problem List   Diagnosis Date Noted   Slow transit constipation 04/05/2020   Near syncope 12/24/2019   Abnormal CT of the chest 05/22/2019   Vitamin D deficiency 05/22/2019   Edema 01/27/2019   Bilateral rales 01/27/2019   Weight gain 01/01/2019   Elevated cholesterol 05/19/2018   Stage 3a chronic kidney disease  ([place]) 04/17/2018   Hyperkalemia 04/04/2018   Bradycardia 03/17/2018   PND (post-nasal drip) 12/10/2017   Neuropathy 12/05/2017   Transient cerebral ischemia 10/06/2007   Carotid disease, bilateral ([place]) 01/27/2007   PAIN IN JOINT, MULTIPLE SITES 01/09/2007   BRANCH RETINAL VEIN OCCLUSION 11/19/2006   Hearing loss 11/19/2006   Essential hypertension 11/19/2006   PEPTIC ULCER DISEASE 11/19/2006   HEADACHE 11/19/2006    Goals Addressed   None    Reviewed chart for medication changes ahead of medication coordination call.  No OVs, Consults, or hospital visits since last care coordination call/Pharmacist visit. No medication changes indicated.  BP Readings from Last 3 Encounters:  04/05/20 136/78  02/29/20 (!) 140/70  01/07/20 (!) 142/82    No results found for: HGBA1C   Patient obtains medications through Adherence Packaging  30 Days   Last adherence delivery included:    Clopidogrel 75 mg daily - breakfast              Gabapentin 300 mg one tablet three times daily - breakfast, evening meal, bedtime              Simvastatin 10 mg daily - bedtime              Chlorthalidone 25 mg daily - breakfast              Vitamin D3 2000 units  daily - breakfast              Vitamin C 500 mg daily - breakfast              Amlodipine 10 mg daily - breakfast              Spironolactone 25 mg daily-breakfast             Docusate sodium 100 mg twice daily- breakfast, evening meal   Patient declined medication  last month due to:   Acetaminophen 500 mg Tablet-OTC  Patient is due for next adherence delivery on: 08/23/2020. Called patient and reviewed medications and coordinated delivery.   This delivery to include:    Clopidogrel 75 mg daily - breakfast              Gabapentin 300 mg one tablet three times daily - breakfast, evening meal, bedtime              Simvastatin 10 mg daily - bedtime              Chlorthalidone 25 mg daily - breakfast              Vitamin D3 2000 units daily  - breakfast              Vitamin C 500 mg daily - breakfast              Amlodipine 10 mg daily - breakfast              Spironolactone 25 mg daily-breakfast             Docusate sodium 100 mg twice daily- breakfast, evening meal  Patient will not  need a short fill of medication , prior to adherence delivery.   Patient will not need a acute fill of medication , prior to adherence delivery.  Patient declined the following medications:  Acetaminophen 500 mg Tablet-OTC  Patient needs refills for  Clopidogrel 75 mg daily , Spironolactone 25 mg daily and Chlorthalidone 25 mg daily.  Confirmed delivery date of 08/23/2020, advised patient that pharmacy will contact them the morning of delivery.  Blood pressure readings: Patient states his blood pressure has been ranging around 138/78.  Follow-Up:  Pharmacist Review   [place] Clinical Pharmacist Assistant [phone]   11 minutes spent in review, coordination, and documentation. [place] Clinical Pharmacist [facility]  [phone]

## 2020-08-29 ENCOUNTER — Telehealth: Payer: Self-pay

## 2020-08-29 NOTE — Progress Notes (Signed)
  Please Void note , open in error   [place] Pharmacist Assistant [phone]

## 2020-09-07 DIAGNOSIS — H348322 Tributary (branch) retinal vein occlusion, left eye, stable: Secondary | ICD-10-CM | POA: Diagnosis not present

## 2020-09-07 DIAGNOSIS — H5203 Hypermetropia, bilateral: Secondary | ICD-10-CM | POA: Diagnosis not present

## 2020-09-08 ENCOUNTER — Ambulatory Visit: Payer: Medicare HMO | Admitting: Family Medicine

## 2020-09-08 ENCOUNTER — Other Ambulatory Visit: Payer: Self-pay | Admitting: Family

## 2020-09-08 DIAGNOSIS — I10 Essential (primary) hypertension: Secondary | ICD-10-CM

## 2020-09-20 ENCOUNTER — Telehealth: Payer: Self-pay

## 2020-09-20 NOTE — Progress Notes (Signed)
Chronic Care Management Pharmacy Assistant   Name: [name]  MRN: [id number] DOB: 1928-06-12  Reason for Encounter: Medication Review  Patient Questions:  1.  Have you seen any other providers since your last visit? No  2.  Any changes in your medicines or health? No   PCP : Libby Maw, MD  Allergies:   Allergies  Allergen Reactions  . Codeine Other (See Comments)    No reaction noted  . Lisinopril     hyperkalemia  . Penicillins     REACTION: rash    Medications: Outpatient Encounter Medications as of 09/20/2020  Medication Sig  . acetaminophen (TYLENOL) 500 MG tablet Take 500 mg by mouth every 6 (six) hours as needed.  [name] amLODipine (NORVASC) 10 MG tablet Take 1 tablet (10 mg total) by mouth daily.  . chlorthalidone (HYGROTON) 25 MG tablet TAKE ONE TABLET BY MOUTH EVERY MORNING  . Cholecalciferol (VITAMIN D3) 2000 units TABS Take 1 tablet by mouth daily.  . clopidogrel (PLAVIX) 75 MG tablet TAKE ONE TABLET BY MOUTH EVERY MORNING  . docusate sodium (COLACE) 100 MG capsule Take 1 capsule (100 mg total) by mouth 2 (two) times daily.  [name] gabapentin (NEURONTIN) 300 MG capsule TAKE ONE CAPSULE BY MOUTH EVERY MORNING and TAKE ONE CAPSULE BY MOUTH EVERY EVENING and TAKE ONE CAPSULE BY MOUTH EVERYDAY AT BEDTIME  . simvastatin (ZOCOR) 10 MG tablet Take 1 tablet (10 mg total) by mouth at bedtime.  [name] spironolactone (ALDACTONE) 25 MG tablet TAKE ONE TABLET BY MOUTH EVERY MORNING  . vitamin C (ASCORBIC ACID) 500 MG tablet Take 500 mg by mouth daily.   No facility-administered encounter medications on file as of 09/20/2020.    Current Diagnosis: Patient Active Problem List   Diagnosis Date Noted  . Slow transit constipation 04/05/2020  . Near syncope 12/24/2019  . Abnormal CT of the chest 05/22/2019  . Vitamin D deficiency 05/22/2019  . Edema 01/27/2019  . Bilateral rales 01/27/2019  . Weight gain 01/01/2019  . Elevated cholesterol 05/19/2018  . Stage 3a  chronic kidney disease ([place]) 04/17/2018  . Hyperkalemia 04/04/2018  . Bradycardia 03/17/2018  . PND (post-nasal drip) 12/10/2017  . Neuropathy 12/05/2017  . Transient cerebral ischemia 10/06/2007  . Carotid disease, bilateral ([place]) 01/27/2007  . PAIN IN JOINT, MULTIPLE SITES 01/09/2007  . [place] OCCLUSION 11/19/2006  . Hearing loss 11/19/2006  . Essential hypertension 11/19/2006  . PEPTIC ULCER DISEASE 11/19/2006  . HEADACHE 11/19/2006    Goals Addressed   None    Reviewed chart for medication changes ahead of medication coordination call.  No OVs, Consults, or hospital visits since last care coordination call/Pharmacist visit.  No medication changes indicated.  BP Readings from Last 3 Encounters:  04/05/20 136/78  02/29/20 (!) 140/70  01/07/20 (!) 142/82    No results found for: HGBA1C   Patient obtains medications through Adherence Packaging  30 Days   Last adherence delivery included:   Clopidogrel 75 mg daily -breakfast  Gabapentin 300 mg one tablet three times daily -breakfast, evening meal, bedtime  Simvastatin 10 mg daily -bedtime  Chlorthalidone 25 mg daily -breakfast  Vitamin D3 2000 units daily -breakfast  Vitamin C 500 mg daily -breakfast  Amlodipine 10 mg daily -breakfast Spironolactone 25 mg daily-breakfast Docusate sodium 100 mg twice daily- breakfast, evening meal Patient declined medication last month    Acetaminophen 500 mg Tablet-OTC Patient is due for next adherence delivery on: 09/28/2020. Called patient and reviewed  medications and coordinated delivery.  This delivery to include:  Clopidogrel 75 mg daily -breakfast  Gabapentin 300 mg one tablet three times daily -breakfast, evening meal, bedtime  Simvastatin 10 mg daily -bedtime  Chlorthalidone 25 mg daily -breakfast  Vitamin  D3 2000 units daily -breakfast  Vitamin C 500 mg daily -breakfast  Amlodipine 10 mg daily -breakfast Spironolactone 25 mg daily-breakfast Docusate sodium 100 mg twice daily- breakfast, evening meal  Patient will not need a short fill of medication, prior to adherence delivery.  Patient will not need a  acute fill of medication, prior to adherence delivery.  Patient declined the following medication:   Acetaminophen 500 mg Tablet-OTC  Patient needs refills for None ID .  Confirmed delivery date of 09/28/2020, advised patient that pharmacy will contact them the morning of delivery.  Follow-Up:  Pharmacist Review   Blood pressure readings: Patient states his blood pressure ranges around 130/78.  [place] Pharmacist Assistant [phone]

## 2020-10-16 ENCOUNTER — Other Ambulatory Visit: Payer: Self-pay | Admitting: Family

## 2020-10-16 DIAGNOSIS — I10 Essential (primary) hypertension: Secondary | ICD-10-CM

## 2020-10-17 ENCOUNTER — Emergency Department (HOSPITAL_BASED_OUTPATIENT_CLINIC_OR_DEPARTMENT_OTHER)
Admission: EM | Admit: 2020-10-17 | Discharge: 2020-10-17 | Disposition: A | Discharge status: Home / Self Care | Payer: Medicare HMO | Attending: Emergency Medicine | Admitting: Emergency Medicine

## 2020-10-17 ENCOUNTER — Emergency Department (HOSPITAL_BASED_OUTPATIENT_CLINIC_OR_DEPARTMENT_OTHER): Payer: Medicare HMO

## 2020-10-17 ENCOUNTER — Encounter (HOSPITAL_BASED_OUTPATIENT_CLINIC_OR_DEPARTMENT_OTHER): Payer: Self-pay | Admitting: Emergency Medicine

## 2020-10-17 ENCOUNTER — Other Ambulatory Visit: Payer: Self-pay

## 2020-10-17 DIAGNOSIS — Z7902 Long term (current) use of antithrombotics/antiplatelets: Secondary | ICD-10-CM | POA: Insufficient documentation

## 2020-10-17 DIAGNOSIS — S0990XA Unspecified injury of head, initial encounter: Secondary | ICD-10-CM | POA: Insufficient documentation

## 2020-10-17 DIAGNOSIS — W01198A Fall on same level from slipping, tripping and stumbling with subsequent striking against other object, initial encounter: Secondary | ICD-10-CM | POA: Diagnosis not present

## 2020-10-17 DIAGNOSIS — I6522 Occlusion and stenosis of left carotid artery: Secondary | ICD-10-CM | POA: Diagnosis not present

## 2020-10-17 DIAGNOSIS — I10 Essential (primary) hypertension: Secondary | ICD-10-CM | POA: Diagnosis not present

## 2020-10-17 DIAGNOSIS — Z79899 Other long term (current) drug therapy: Secondary | ICD-10-CM | POA: Diagnosis not present

## 2020-10-17 DIAGNOSIS — Y93C2 Activity, hand held interactive electronic device: Secondary | ICD-10-CM | POA: Diagnosis not present

## 2020-10-17 DIAGNOSIS — S199XXA Unspecified injury of neck, initial encounter: Secondary | ICD-10-CM | POA: Diagnosis not present

## 2020-10-17 DIAGNOSIS — Z9011 Acquired absence of right breast and nipple: Secondary | ICD-10-CM | POA: Diagnosis not present

## 2020-10-17 NOTE — ED Triage Notes (Signed)
Patient arrived via POV c/o fall striking head on door jam. Patient states he missed the chair while attempting to sit down, striking the left side of his head against the door jam. Patient is on plavix. Patient denies LOC. Patient is AO x 4, VS with elevated BP, normal gait.

## 2020-10-17 NOTE — ED Provider Notes (Signed)
[facility] EMERGENCY DEPARTMENT Provider Note  CSN: [id number] Arrival date & time: 10/17/20 2204    History Chief Complaint  Patient presents with  . Fall    HPI  [name] is a 87 y.o. male presents for evaluation after a fall. He was trying to sit in a chair while on the phone and missed the edge of the chair, falling sideways and hitting his L face/head on the door jam. Did not have LOC. Complaining of mild pain. He is taking Plavix and his daughter called their PCP office and was advised to bring him to the ED for evaluation. Denies any other injuries.    Past Medical History:  Diagnosis Date  . Branch retinal vein occlusion of left eye   . High cholesterol   . Hypertension   . TIA (transient ischemic attack)     Past Surgical History:  Procedure Laterality Date  . CAROTID ENDARTERECTOMY Right   . HERNIA REPAIR    . INNER EAR SURGERY Right    for hearing loss    Family History  Problem Relation Age of Onset  . Lung cancer Father     Social History   Tobacco Use  . Smoking status: Never Smoker  . Smokeless tobacco: Never Used  Vaping Use  . Vaping Use: Never used  Substance Use Topics  . Alcohol use: Yes    Comment: 1 glass wine  . Drug use: Never     Home Medications Prior to Admission medications   Medication Sig Start Date End Date Taking? Authorizing Provider  acetaminophen (TYLENOL) 500 MG tablet Take 500 mg by mouth every 6 (six) hours as needed.    [provider]  amLODipine (NORVASC) 10 MG tablet Take 1 tablet (10 mg total) by mouth daily. 07/13/20   Libby Maw, MD  chlorthalidone (HYGROTON) 25 MG tablet TAKE ONE TABLET BY MOUTH EVERY MORNING 09/08/20   Dutch Quint B, FNP  Cholecalciferol (VITAMIN D3) 2000 units TABS Take 1 tablet by mouth daily.    [provider]  clopidogrel (PLAVIX) 75 MG tablet TAKE ONE TABLET BY MOUTH EVERY MORNING 05/16/20   Libby Maw, MD  docusate sodium  (COLACE) 100 MG capsule Take 1 capsule (100 mg total) by mouth 2 (two) times daily. 04/05/20   Libby Maw, MD  gabapentin (NEURONTIN) 300 MG capsule TAKE ONE CAPSULE BY MOUTH EVERY MORNING and TAKE ONE CAPSULE BY MOUTH EVERY EVENING and TAKE ONE CAPSULE BY MOUTH EVERYDAY AT BEDTIME 08/15/20   Kennyth Arnold, FNP  [facility] Med injection  07/29/20   [provider]  simvastatin (ZOCOR) 10 MG tablet Take 1 tablet (10 mg total) by mouth at bedtime. 12/22/19   Libby Maw, MD  spironolactone (ALDACTONE) 25 MG tablet TAKE ONE TABLET BY MOUTH EVERY MORNING 10/17/20   Dutch Quint B, FNP  vitamin C (ASCORBIC ACID) 500 MG tablet Take 500 mg by mouth daily.    [provider]     Allergies    Codeine, Lisinopril, and Penicillins   Review of Systems   Review of Systems A comprehensive review of systems was completed and negative except as noted in HPI.    Physical Exam BP (!) 161/89 (BP Location: Left Arm)   Pulse (!) 102   Temp 97.7 F (36.5 C) (Oral)   Resp 20   Ht '5\' 10"'$  (1.778 m)   Wt 82.6 kg   SpO2 96%   BMI 26.11 kg/m  Physical Exam Vitals and nursing note reviewed.  Constitutional:      Appearance: Normal appearance.  HENT:     Head: Normocephalic and atraumatic.     Nose: Nose normal.     Mouth/Throat:     Mouth: Mucous membranes are moist.  Eyes:     Extraocular Movements: Extraocular movements intact.     Conjunctiva/sclera: Conjunctivae normal.  Cardiovascular:     Rate and Rhythm: Normal rate.  Pulmonary:     Effort: Pulmonary effort is normal.     Breath sounds: Normal breath sounds.  Abdominal:     General: Abdomen is flat.     Palpations: Abdomen is soft.     Tenderness: There is no abdominal tenderness.  Musculoskeletal:        General: No swelling, tenderness or signs of injury. Normal range of motion.     Cervical back: Neck supple. No tenderness.  Skin:    General: Skin is warm and dry.  Neurological:     General: No  focal deficit present.     Mental Status: He is alert.  Psychiatric:        Mood and Affect: Mood normal.      ED Results / Procedures / Treatments   Labs (all labs ordered are listed, but only abnormal results are displayed) Labs Reviewed - No data to display  EKG None   Radiology CT Head Wo Contrast  Result Date: 10/17/2020 CLINICAL DATA:  87 year old male with neck trauma. EXAM: CT HEAD WITHOUT CONTRAST CT CERVICAL SPINE WITHOUT CONTRAST TECHNIQUE: Multidetector CT imaging of the head and cervical spine was performed following the standard protocol without intravenous contrast. Multiplanar CT image reconstructions of the cervical spine were also generated. COMPARISON:  None. FINDINGS: CT HEAD FINDINGS Brain: Mild age-related atrophy and chronic microvascular ischemic changes. There is no acute intracranial hemorrhage. No mass effect or midline shift. No extra-axial fluid collection. Vascular: No hyperdense vessel or unexpected calcification. Skull: Normal. Negative for fracture or focal lesion. Sinuses/Orbits: The visualized paranasal sinuses and the left mastoid air cells are clear. Prior right mastectomy. Other: None CT CERVICAL SPINE FINDINGS Alignment: No acute subluxation. Skull base and vertebrae: No acute fracture.  Osteopenia. Soft tissues and spinal canal: No prevertebral fluid or swelling. No visible canal hematoma. Disc levels:  Multilevel degenerative changes. Upper chest: Not visualized. Other: Left carotid bulb calcified plaques. Probable prior right carotid endarterectomy. IMPRESSION: 1. No acute intracranial pathology. Mild age-related atrophy and chronic microvascular ischemic changes. 2. No acute/traumatic cervical spine pathology. Multilevel degenerative changes. Electronically Signed   By: Anner Crete M.D.   On: 10/17/2020 22:46   CT Cervical Spine Wo Contrast  Result Date: 10/17/2020 CLINICAL DATA:  87 year old male with neck trauma. EXAM: CT HEAD WITHOUT CONTRAST  CT CERVICAL SPINE WITHOUT CONTRAST TECHNIQUE: Multidetector CT imaging of the head and cervical spine was performed following the standard protocol without intravenous contrast. Multiplanar CT image reconstructions of the cervical spine were also generated. COMPARISON:  None. FINDINGS: CT HEAD FINDINGS Brain: Mild age-related atrophy and chronic microvascular ischemic changes. There is no acute intracranial hemorrhage. No mass effect or midline shift. No extra-axial fluid collection. Vascular: No hyperdense vessel or unexpected calcification. Skull: Normal. Negative for fracture or focal lesion. Sinuses/Orbits: The visualized paranasal sinuses and the left mastoid air cells are clear. Prior right mastectomy. Other: None CT CERVICAL SPINE FINDINGS Alignment: No acute subluxation. Skull base and vertebrae: No acute fracture.  Osteopenia. Soft tissues and spinal canal: No prevertebral fluid  or swelling. No visible canal hematoma. Disc levels:  Multilevel degenerative changes. Upper chest: Not visualized. Other: Left carotid bulb calcified plaques. Probable prior right carotid endarterectomy. IMPRESSION: 1. No acute intracranial pathology. Mild age-related atrophy and chronic microvascular ischemic changes. 2. No acute/traumatic cervical spine pathology. Multilevel degenerative changes. Electronically Signed   By: Anner Crete M.D.   On: 10/17/2020 22:46    Procedures Procedures  Medications Ordered in the ED Medications - No data to display   MDM Rules/Calculators/A&P MDM CT scans ordered prior to my evaluation reviewed and negative for acute injury. Patient reassured and will be discharged home with daughter. PCP appointment already scheduled for tomorrow. RTED for any other concerns.   ED Course  I have reviewed the triage vital signs and the nursing notes.  Pertinent labs & imaging results that were available during my care of the patient were reviewed by me and considered in my medical decision  making (see chart for details).     Final Clinical Impression(s) / ED Diagnoses Final diagnoses:  Minor head injury, initial encounter    Rx / DC Orders ED Discharge Orders    None       Truddie Hidden, MD 10/17/20 2259

## 2020-10-18 ENCOUNTER — Ambulatory Visit (INDEPENDENT_AMBULATORY_CARE_PROVIDER_SITE_OTHER): Payer: Medicare HMO | Admitting: Family Medicine

## 2020-10-18 ENCOUNTER — Encounter: Payer: Self-pay | Admitting: Family Medicine

## 2020-10-18 VITALS — BP 138/70 | HR 64 | Temp 96.9°F | Ht 70.0 in | Wt 185.4 lb

## 2020-10-18 DIAGNOSIS — J8410 Pulmonary fibrosis, unspecified: Secondary | ICD-10-CM

## 2020-10-18 DIAGNOSIS — E7800 Pure hypercholesterolemia, unspecified: Secondary | ICD-10-CM | POA: Diagnosis not present

## 2020-10-18 DIAGNOSIS — I10 Essential (primary) hypertension: Secondary | ICD-10-CM | POA: Diagnosis not present

## 2020-10-18 DIAGNOSIS — W19XXXD Unspecified fall, subsequent encounter: Secondary | ICD-10-CM

## 2020-10-18 DIAGNOSIS — H9110 Presbycusis, unspecified ear: Secondary | ICD-10-CM | POA: Diagnosis not present

## 2020-10-18 DIAGNOSIS — E559 Vitamin D deficiency, unspecified: Secondary | ICD-10-CM

## 2020-10-18 DIAGNOSIS — W19XXXA Unspecified fall, initial encounter: Secondary | ICD-10-CM | POA: Insufficient documentation

## 2020-10-18 DIAGNOSIS — N1831 Chronic kidney disease, stage 3a: Secondary | ICD-10-CM | POA: Diagnosis not present

## 2020-10-18 LAB — URINALYSIS, ROUTINE W REFLEX MICROSCOPIC
Bilirubin Urine: NEGATIVE
Hgb urine dipstick: NEGATIVE
Ketones, ur: NEGATIVE
Leukocytes,Ua: NEGATIVE
Nitrite: NEGATIVE
RBC / HPF: NONE SEEN (ref 0–?)
Specific Gravity, Urine: 1.025 (ref 1.000–1.030)
Total Protein, Urine: NEGATIVE
Urine Glucose: NEGATIVE
Urobilinogen, UA: 0.2 (ref 0.0–1.0)
WBC, UA: NONE SEEN (ref 0–?)
pH: 5.5 (ref 5.0–8.0)

## 2020-10-18 LAB — CBC
HCT: 43.5 % (ref 39.0–52.0)
Hemoglobin: 14.3 g/dL (ref 13.0–17.0)
MCHC: 32.9 g/dL (ref 30.0–36.0)
MCV: 85.4 fl (ref 78.0–100.0)
Platelets: 229 10*3/uL (ref 150.0–400.0)
RBC: 5.1 Mil/uL (ref 4.22–5.81)
RDW: 13.5 % (ref 11.5–15.5)
WBC: 8.6 10*3/uL (ref 4.0–10.5)

## 2020-10-18 LAB — BASIC METABOLIC PANEL
BUN: 33 mg/dL — ABNORMAL HIGH (ref 6–23)
CO2: 24 mEq/L (ref 19–32)
Calcium: 9 mg/dL (ref 8.4–10.5)
Chloride: 103 mEq/L (ref 96–112)
Creatinine, Ser: 1.35 mg/dL (ref 0.40–1.50)
GFR: 46.06 mL/min — ABNORMAL LOW (ref >60.00)
Glucose, Bld: 114 mg/dL — ABNORMAL HIGH (ref 70–99)
Potassium: 4.1 mEq/L (ref 3.5–5.1)
Sodium: 138 mEq/L (ref 135–145)

## 2020-10-18 LAB — LIPID PANEL
Cholesterol: 148 mg/dL (ref 0–200)
HDL: 42 mg/dL (ref >39.00)
LDL Cholesterol: 80 mg/dL (ref 0–99)
NonHDL: 105.88
Total CHOL/HDL Ratio: 4
Triglycerides: 128 mg/dL (ref 0.0–149.0)
VLDL: 25.6 mg/dL (ref 0.0–40.0)

## 2020-10-18 LAB — VITAMIN D 25 HYDROXY (VIT D DEFICIENCY, FRACTURES): VITD: 49.42 ng/mL (ref 30.00–100.00)

## 2020-10-18 NOTE — Progress Notes (Addendum)
Established Patient Office Visit  Subjective:  Patient ID: [name], male    DOB: 25-Dec-1927  Age: 87 y.o. MRN: [id number]  CC:  Chief Complaint  Patient presents with  . Follow-up    Follow up on BP patient was also seen at ED yesterday for a fall. No concerns. Fasting for labs.     HPI DAGEM LAZER presents for follow-up of hypertension, elevated cholesterol, vitamin D deficiency, CKD, fibrotic lung disease and recent fall last night.  Patient went to sit in a chair and slipped off and fell to the floor.  In that process he struck his head on the side of a door that was close by.  There was no LOC, denies lightheadedness with standing.  No palpitations.  Today he feels fine.  There is no headache or nausea.  Feels like he is thinking well.  Otherwise doing well.  Sleeps 8 hours nightly.  He uses a cane to assist with ambulation.  Denies shortness of breath.  Exercises by walking.  Blood pressure is well controlled on current regimen.  Continues vitamin D supplementation. Family as noted increasing difficulty with hearing.   Past Medical History:  Diagnosis Date  . Branch retinal vein occlusion of left eye   . High cholesterol   . Hypertension   . TIA (transient ischemic attack)     Past Surgical History:  Procedure Laterality Date  . CAROTID ENDARTERECTOMY Right   . HERNIA REPAIR    . INNER EAR SURGERY Right    for hearing loss    Family History  Problem Relation Age of Onset  . Lung cancer Father     Social History   Socioeconomic History  . Marital status: Widowed    Spouse name: Not on file  . Number of children: 4  . Years of education: Not on file  . 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 Smoking status: Never Smoker  . Smokeless tobacco: Never Used  Vaping Use  . Vaping Use: Never used  Substance and Sexual Activity  . Alcohol use: Yes    Comment: 1 glass  wine  . Drug use: Never  . Sexual activity: Not on file  Other Topics Concern  . Not on file  Social History Narrative   Lives alone   Social Determinants of Health   Financial Resource Strain: Low Risk   . Difficulty of Paying Living Expenses: Not hard at all  Food Insecurity: Not on file  Transportation Needs: No Transportation Needs  . Lack of Transportation (Medical): No  . Lack of Transportation (Non-Medical): No  Physical Activity: Not on file  Stress: Not on file  Social Connections: Not on file  Intimate Partner Violence: Not on file    Outpatient Medications Prior to Visit  Medication Sig Dispense Refill  . acetaminophen (TYLENOL) 500 MG tablet Take 500 mg by mouth every 6 (six) hours as needed.    [name] amLODipine (NORVASC) 10 MG tablet Take 1 tablet (10 mg total) by mouth daily. 90 tablet 0  . chlorthalidone (HYGROTON) 25 MG tablet TAKE ONE TABLET BY MOUTH EVERY MORNING 30 tablet 2  . Cholecalciferol (VITAMIN D3) 2000 units TABS Take 1 tablet by mouth daily.    . clopidogrel (PLAVIX) 75 MG tablet TAKE ONE TABLET BY MOUTH EVERY MORNING 90 tablet 0  . docusate sodium (COLACE) 100 MG capsule Take 1 capsule (100  mg total) by mouth 2 (two) times daily. 100 capsule 0  . gabapentin (NEURONTIN) 300 MG capsule TAKE ONE CAPSULE BY MOUTH EVERY MORNING and TAKE ONE CAPSULE BY MOUTH EVERY EVENING and TAKE ONE CAPSULE BY MOUTH EVERYDAY AT BEDTIME 90 capsule 4  . simvastatin (ZOCOR) 10 MG tablet Take 1 tablet (10 mg total) by mouth at bedtime. 90 tablet 3  . spironolactone (ALDACTONE) 25 MG tablet TAKE ONE TABLET BY MOUTH EVERY MORNING 30 tablet 1  . vitamin C (ASCORBIC ACID) 500 MG tablet Take 500 mg by mouth daily.    [name] SHINGRIX injection      No facility-administered medications prior to visit.    Allergies  Allergen Reactions  . Codeine Other (See Comments)    No reaction noted  . Lisinopril     hyperkalemia  . Penicillins     REACTION: rash    ROS Review of Systems   Constitutional: Negative.  Negative for diaphoresis, fatigue, fever and unexpected weight change.  HENT: Negative.   Eyes: Negative for photophobia and visual disturbance.  Respiratory: Negative for cough, chest tightness, shortness of breath and wheezing.   Cardiovascular: Negative for chest pain and palpitations.  Gastrointestinal: Negative.   Genitourinary: Negative.   Musculoskeletal: Positive for arthralgias (hands and knees).  Skin: Negative for color change and pallor.  Neurological: Negative for dizziness, speech difficulty, weakness, light-headedness and headaches.  Psychiatric/Behavioral: Negative.       Objective:    Physical Exam Vitals and nursing note reviewed.  Constitutional:      General: He is not in acute distress.    Appearance: Normal appearance. He is not ill-appearing, toxic-appearing or diaphoretic.  HENT:     Head: Normocephalic and atraumatic.     Right Ear: Tympanic membrane, ear canal and external ear normal.     Left Ear: Tympanic membrane, ear canal and external ear normal.     Mouth/Throat:     Mouth: Mucous membranes are moist.     Pharynx: Oropharynx is clear. No oropharyngeal exudate or posterior oropharyngeal erythema.  Eyes:     General: No scleral icterus.    Extraocular Movements: Extraocular movements intact.     Conjunctiva/sclera: Conjunctivae normal.     Pupils: Pupils are equal, round, and reactive to light.  Cardiovascular:     Rate and Rhythm: Normal rate and regular rhythm.  Pulmonary:     Effort: Pulmonary effort is normal.     Breath sounds: Examination of the right-lower field reveals rales. Examination of the left-lower field reveals rales. Rales present.  Chest:     Chest wall: No mass, deformity or tenderness.  Breasts:     Right: No inverted nipple, mass, nipple discharge, skin change or tenderness.     Left: No inverted nipple, mass, nipple discharge, skin change or tenderness.    Abdominal:     General: Bowel  sounds are normal.  Musculoskeletal:     Cervical back: No rigidity or tenderness.  Lymphadenopathy:     Cervical: No cervical adenopathy.  Skin:    General: Skin is warm and dry.  Neurological:     Mental Status: He is alert and oriented to person, place, and time.  Psychiatric:        Mood and Affect: Mood normal.        Behavior: Behavior normal.     BP 138/70   Pulse 64   Temp (!) 96.9 F (36.1 C) (Temporal)   Ht '5\' 10"'$  (1.778 m)  Wt 185 lb 6.4 oz (84.1 kg)   SpO2 96%   BMI 26.60 kg/m  Wt Readings from Last 3 Encounters:  10/18/20 185 lb 6.4 oz (84.1 kg)  10/17/20 182 lb (82.6 kg)  04/05/20 187 lb (84.8 kg)     There are no preventive care reminders to display for this patient.  There are no preventive care reminders to display for this patient.  Lab Results  Component Value Date   TSH 2.650 04/04/2018   Lab Results  Component Value Date   WBC 7.2 05/15/2019   HGB 14.4 05/15/2019   HCT 44.8 05/15/2019   MCV 85.9 05/15/2019   PLT 231.0 05/15/2019   Lab Results  Component Value Date   NA 137 02/29/2020   K 3.7 02/29/2020   CO2 28 02/29/2020   GLUCOSE 116 (H) 02/29/2020   BUN 24 (H) 02/29/2020   CREATININE 1.30 02/29/2020   BILITOT 0.6 05/15/2019   ALKPHOS 112 05/15/2019   AST 13 05/15/2019   ALT 9 05/15/2019   PROT 6.7 05/15/2019   ALBUMIN 4.0 05/15/2019   CALCIUM 9.0 02/29/2020   GFR 51.82 (L) 02/29/2020   Lab Results  Component Value Date   CHOL 133 10/26/2019   Lab Results  Component Value Date   HDL 34.80 (L) 10/26/2019   Lab Results  Component Value Date   LDLCALC 74 10/26/2019   Lab Results  Component Value Date   TRIG 120.0 10/26/2019   Lab Results  Component Value Date   CHOLHDL 4 10/26/2019   No results found for: HGBA1C    Assessment & Plan:   Problem List Items Addressed This Visit      Cardiovascular and Mediastinum   Essential hypertension - Primary   Relevant Orders   CBC   Basic metabolic panel    Urinalysis, Routine w reflex microscopic     Respiratory   Fibrotic lung diseases (HCC)     Genitourinary   Stage 3a chronic kidney disease (HCC)   Relevant Orders   Basic metabolic panel     Other   Elevated cholesterol   Relevant Orders   Lipid panel   Vitamin D deficiency   Relevant Orders   VITAMIN D 25 Hydroxy (Vit-D Deficiency, Fractures)   Fall      No orders of the defined types were placed in this encounter.   Follow-up: Return in about 3 months (around 01/18/2021).  Blood pressure well controlled on current regimen.  Checking cholesterol and vitamin D today.  Fibrotic lung disease is stable.  Will consider pulmonology referral.   Libby Maw, MD

## 2020-10-21 ENCOUNTER — Telehealth: Payer: Self-pay

## 2020-10-21 NOTE — Progress Notes (Signed)
Spoke to patient to confirmed patient telephone appointment on 10/24/2020 for CCM at 9:00 am with [name] the Clinical pharmacist.   Patient Verbalized understanding and denies any side effects from any of his current medications.  [place] Pharmacist Assistant [phone]

## 2020-10-24 ENCOUNTER — Telehealth: Payer: Medicare HMO

## 2020-10-24 ENCOUNTER — Ambulatory Visit (INDEPENDENT_AMBULATORY_CARE_PROVIDER_SITE_OTHER): Payer: Medicare HMO

## 2020-10-24 ENCOUNTER — Other Ambulatory Visit: Payer: Self-pay

## 2020-10-24 DIAGNOSIS — I10 Essential (primary) hypertension: Secondary | ICD-10-CM

## 2020-10-24 DIAGNOSIS — E785 Hyperlipidemia, unspecified: Secondary | ICD-10-CM | POA: Diagnosis not present

## 2020-10-24 DIAGNOSIS — N1831 Chronic kidney disease, stage 3a: Secondary | ICD-10-CM

## 2020-10-24 DIAGNOSIS — G459 Transient cerebral ischemic attack, unspecified: Secondary | ICD-10-CM

## 2020-10-24 MED ORDER — AMLODIPINE BESYLATE 10 MG PO TABS: 10.0000 mg | ORAL_TABLET | Freq: Every day | ORAL | 0 refills | Status: DC

## 2020-10-24 NOTE — Progress Notes (Signed)
Chronic Care Management Pharmacy Note  10/24/2020 Name:  [name] MRN:  [id number] DOB:  [birth year]  Subjective: [name] is an 87 y.o. year old male who is a primary patient of Libby Maw, MD.  The CCM team was consulted for assistance with disease management and care coordination needs.    Engaged with patient by telephone for follow up visit in response to provider referral for pharmacy case management and/or care coordination services.   Consent to Services:  The patient was given information about Chronic Care Management services, agreed to services, and gave verbal consent prior to initiation of services.  Please see initial visit note for detailed documentation.   Patient Care Team: Libby Maw, MD as PCP - General (Family Medicine) Germaine Pomfret, [facility] as Pharmacist (Pharmacist)  Recent office visits: 10/18/20: Patient presented to Dr. Ethelene Hal for follow-up following fall. BP 138/70. Patient dehydrated. No medication changes made.   Recent consult visits: None in past 6 months.   Hospital visits: 10/17/20: Patient presented to ED following fall and minor head injury.   Objective:  Lab Results  Component Value Date   CREATININE 1.35 10/18/2020   BUN 33 (H) 10/18/2020   GFR 46.06 (L) 10/18/2020   GFRNONAA 45 (L) 01/27/2019   GFRAA 52 (L) 01/27/2019   NA 138 10/18/2020   K 4.1 10/18/2020   CALCIUM 9.0 10/18/2020   CO2 24 10/18/2020   GLUCOSE 114 (H) 10/18/2020    Lab Results  Component Value Date/Time   GFR 46.06 (L) 10/18/2020 11:07 AM   GFR 51.82 (L) 02/29/2020 10:38 AM   MICROALBUR 1.2 12/10/2017 10:43 AM    Last diabetic Eye exam: No results found for: HMDIABEYEEXA  Last diabetic Foot exam: No results found for: HMDIABFOOTEX   Lab Results  Component Value Date   CHOL 148 10/18/2020   HDL 42.00 10/18/2020   LDLCALC 80 10/18/2020   LDLDIRECT 88.0 05/15/2019   TRIG 128.0 10/18/2020   CHOLHDL 4 10/18/2020     Hepatic Function Latest Ref Rng & Units 05/15/2019 01/14/2018 12/10/2017  Total Protein 6.0 - 8.3 g/dL 6.7 6.8 6.7  Albumin 3.5 - 5.2 g/dL 4.0 - 3.8  AST 0 - 37 U/L 13 - 11  ALT 0 - 53 U/L 9 - 9  Alk Phosphatase 39 - 117 U/L 112 - 120(H)  Total Bilirubin 0.2 - 1.2 mg/dL 0.6 - 0.5    Lab Results  Component Value Date/Time   TSH 2.650 04/04/2018 03:55 PM   TSH 2.92 06/27/2016 12:00 AM   TSH 1.52 01/09/2007 11:49 AM    CBC Latest Ref Rng & Units 10/18/2020 05/15/2019 08/19/2018  WBC 4.0 - 10.5 K/uL 8.6 7.2 8.5  Hemoglobin 13.0 - 17.0 g/dL 14.3 14.4 13.6  Hematocrit 39.0 - 52.0 % 43.5 44.8 42.6  Platelets 150.0 - 400.0 K/uL 229.0 231.0 282.0    Lab Results  Component Value Date/Time   VD25OH 49.42 10/18/2020 11:07 AM   VD25OH 35.22 05/15/2019 11:37 AM    Clinical ASCVD: Yes  The ASCVD Risk score ([place] et al., 2013) failed to calculate for the following reasons:   The 2013 ASCVD risk score is only valid for ages 72 to 27    Depression screen PHQ 2/9 10/18/2020 10/14/2019 07/27/2019  Decreased Interest 0 0 0  Down, Depressed, Hopeless 0 0 0  PHQ - 2 Score 0 0 0    Social History   Tobacco Use  Smoking Status Never Smoker  Smokeless Tobacco Never Used   BP Readings from Last 3 Encounters:  10/18/20 138/70  10/17/20 (!) 161/89  04/05/20 136/78   Pulse Readings from Last 3 Encounters:  10/18/20 64  10/17/20 (!) 102  04/05/20 89   Wt Readings from Last 3 Encounters:  10/18/20 185 lb 6.4 oz (84.1 kg)  10/17/20 182 lb (82.6 kg)  04/05/20 187 lb (84.8 kg)   BMI Readings from Last 3 Encounters:  10/18/20 26.60 kg/m  10/17/20 26.11 kg/m  04/05/20 26.83 kg/m    Assessment/Interventions: Review of patient past medical history, allergies, medications, health status, including review of consultants reports, laboratory and other test data, was performed as part of comprehensive evaluation and provision of chronic care management services.   SDOH:  (Social  Determinants of Health) assessments and interventions performed: Yes SDOH Interventions   Flowsheet Row Most Recent Value  SDOH Interventions   Transportation Interventions Intervention Not Indicated     SDOH Screenings   Alcohol Screen: Not on file  Depression (PHQ2-9): Low Risk   . PHQ-2 Score: 0  Financial Resource Strain: Low Risk   . Difficulty of Paying Living Expenses: Not hard at all  Food Insecurity: Not on file  Housing: Not on file  Physical Activity: Not on file  Social Connections: Not on file  Stress: Not on file  Tobacco Use: Low Risk   . Smoking Tobacco Use: Never Smoker  . Smokeless Tobacco Use: Never Used  Transportation Needs: No Transportation Needs  . Lack of Transportation (Medical): No  . Lack of Transportation (Non-Medical): No    CCM Care Plan  Allergies  Allergen Reactions  . Codeine Other (See Comments)    No reaction noted  . Lisinopril     hyperkalemia  . Penicillins     REACTION: rash    Medications Reviewed Today    Reviewed by Germaine Pomfret, [facility] (Pharmacist) on 10/24/20 at [phone]  Med List Status: <None>  Medication Order Taking? Sig Documenting Provider Last Dose Status Informant  acetaminophen (TYLENOL) 500 MG tablet [id number]  Take 500 mg by mouth every 6 (six) hours as needed. [provider]  Active   amLODipine (NORVASC) 10 MG tablet [id number] Yes Take 1 tablet (10 mg total) by mouth daily. Libby Maw, MD Taking Active   chlorthalidone (HYGROTON) 25 MG tablet [id number] Yes TAKE ONE TABLET BY MOUTH EVERY MORNING Dutch Quint B, FNP Taking Active   Cholecalciferol (VITAMIN D3) 2000 units TABS [id number] Yes Take 1 tablet by mouth daily. [provider] Taking Active   clopidogrel (PLAVIX) 75 MG tablet [id number] Yes TAKE ONE TABLET BY MOUTH EVERY MORNING Libby Maw, MD Taking Active   docusate sodium (COLACE) 100 MG capsule [id number] Yes Take 1 capsule (100 mg total) by mouth 2 (two) times  daily. Libby Maw, MD Taking Active   gabapentin (NEURONTIN) 300 MG capsule [id number] Yes TAKE ONE CAPSULE BY MOUTH EVERY MORNING and TAKE ONE CAPSULE BY MOUTH EVERY EVENING and TAKE ONE CAPSULE BY MOUTH EVERYDAY AT BEDTIME Kennyth Arnold, FNP Taking Active   [facility] injection [id number]   [provider]  Active   simvastatin (ZOCOR) 10 MG tablet [id number] Yes Take 1 tablet (10 mg total) by mouth at bedtime. Libby Maw, MD Taking Active   spironolactone (ALDACTONE) 25 MG tablet [id number] Yes TAKE ONE TABLET BY MOUTH EVERY MORNING Dutch Quint B, FNP Taking Active   vitamin C (ASCORBIC ACID) 500 MG tablet [id number] Yes Take 500 mg by  mouth daily. [provider] Taking Active           Patient Active Problem List   Diagnosis Date Noted  . Fall 10/18/2020  . Fibrotic lung diseases ([place]) 10/18/2020  . Slow transit constipation 04/05/2020  . Near syncope 12/24/2019  . Abnormal CT of the chest 05/22/2019  . Vitamin D deficiency 05/22/2019  . Edema 01/27/2019  . Bilateral rales 01/27/2019  . Weight gain 01/01/2019  . Elevated cholesterol 05/19/2018  . Stage 3a chronic kidney disease ([place]) 04/17/2018  . Hyperkalemia 04/04/2018  . Bradycardia 03/17/2018  . PND (post-nasal drip) 12/10/2017  . Neuropathy 12/05/2017  . Transient cerebral ischemia 10/06/2007  . Carotid disease, bilateral ([place]) 01/27/2007  . PAIN IN JOINT, MULTIPLE SITES 01/09/2007  . [place] OCCLUSION 11/19/2006  . Hearing loss 11/19/2006  . Essential hypertension 11/19/2006  . PEPTIC ULCER DISEASE 11/19/2006  . HEADACHE 11/19/2006    Immunization History  Administered Date(s) Administered  . Fluad Quad(high Dose 65+) 04/15/2019  . Influenza Whole 06/19/2007  . Influenza, High Dose Seasonal PF 05/02/2018  . Influenza-Unspecified 05/13/2017, 05/28/2020  . Moderna Sars-Covid-2 Vaccination 09/30/2019, 10/28/2019, 05/28/2020  . Pneumococcal Conjugate-13 07/31/2015   . Pneumococcal Polysaccharide-23 07/31/2015, 10/24/2016  . Zoster Recombinat (Shingrix) 04/26/2020, 07/29/2020    Conditions to be addressed/monitored:  Hypertension, Hyperlipidemia, Chronic Kidney Disease and History of TIA   Care Plan : General Pharmacy (Adult)  Updates made by Germaine Pomfret, RPH since 10/24/2020 12:00 AM    Problem: Hypertension, Hyperlipidemia, Chronic Kidney Disease and History of TIA   Priority: High    Long-Range Goal: Patient-Specific Goal   Start Date: 10/24/2020  Expected End Date: 04/26/2021  This Visit's Progress: On track  Priority: High  Note:   Current Barriers:  . No Barriers Noted  Pharmacist Clinical Goal(s):  [name] Patient will maintain control of blood pressure as evidenced by BP less than 140/90  through collaboration with PharmD and provider.   Interventions: . 1:1 collaboration with Libby Maw, MD regarding development and update of comprehensive plan of care as evidenced by provider attestation and co-signature . Inter-disciplinary care team collaboration (see longitudinal plan of care) . Comprehensive medication review performed; medication list updated in electronic medical record  Hypertension (BP goal <140/90) -Controlled -Current treatment: . Amlodipine 10 mg daily  . Chlorthalidone 25 mg daily  . Spironolactone 25 mg daily  -Medications previously tried: NA  -Current home readings: [id number] systolic -Current dietary habits: Tries to minimize salt intake -Current exercise habits: Tries to walk  -Denies hypotensive/hypertensive symptoms -Educated on Daily salt intake goal < 2300 mg; Exercise goal of 150 minutes per week; -Counseled to monitor BP at home weekly, document, and provide log at future appointments -Recommended to continue current medication  -Recommended to increase hydration to 50-60 oz daily   Hyperlipidemia: (LDL goal < 100) -Controlled -Current treatment: . Simvastatin 10 mg daily  -Current  antiplatelet treatment: . Clopidogrel 75 mg daily   -Medications previously tried: NA  -Educated on Importance of limiting foods high in cholesterol; -Recommended to continue current medication   Patient Goals/Self-Care Activities . Patient will:  - check blood pressure weekly, document, and provide at future appointments increase water intake (goal 60 oz daily)  Follow Up Plan: Telephone follow up appointment with care management team member scheduled for:  04/24/2021 at 11:00 AM      Medication Assistance: None required.  Patient affirms current coverage meets needs.  Patient's preferred pharmacy is:  [facility] -  [place], [place] - [address] [place] [address] Phone: [phone] Fax: [phone]  Patient decided to: Utilize [facility] for medication synchronization, packaging and delivery   Reviewed chart for medication changes ahead of medication coordination call.  Patient obtains medications through Adherence Packaging  30 Days   Last adherence delivery included:   Clopidogrel 75 mg daily -breakfast  Gabapentin 300 mg one tablet three times daily -breakfast, evening meal, bedtime  Simvastatin 10 mg daily -bedtime  Chlorthalidone 25 mg daily -breakfast  Vitamin D3 2000 units daily -breakfast  Vitamin C 500 mg daily -breakfast  Amlodipine 10 mg daily -breakfast Spironolactone 25 mg daily-breakfast Docusate sodium 100 mg twice daily- breakfast, evening meal  Patient is due for next adherence delivery on: 10/29/2020. Called patient and reviewed medications and coordinated delivery.  This delivery to include: Amlodipine 10 mg daily - breakfast  Chlorthalidone 25 mg daily - breakfast  Clopidogrel 75 mg daily - breakfast  Docusate sodium 100 mg twice daily- breakfast, evening meal Gabapentin 300  mg one tablet three times daily - breakfast, evening meal, bedtime  Simvastatin 10 mg daily - bedtime  Spironolactone 25 mg daily-breakfast Vitamin D3 2000 units daily - breakfast  Vitamin C 500 mg daily - breakfast   Patient needs refills for: amlodipine, clopidogrel. Requested refills from PCP team.   Confirmed delivery date of 10/28/2020, advised patient that pharmacy will contact them the morning of delivery.  Care Plan and Follow Up Patient Decision:  Patient agrees to Care Plan and Follow-up.  Plan: Telephone follow up appointment with care management team member scheduled for:  04/24/2021 at 11:00 AM  [name], PharmD, CPP Clinical Pharmacist [facility]  [phone]

## 2020-10-24 NOTE — Telephone Encounter (Signed)
Refill request for pending Rx last OV 10/18/20. Last refill 05/16/2020. Please advise.

## 2020-10-24 NOTE — Patient Instructions (Signed)
Visit Information It was great speaking with you today!  Please let me know if you have any questions about our visit.  Goals Addressed            This Visit's Progress   . Track and Manage My Blood Pressure-Hypertension       Timeframe:  Long-Range Goal Priority:  High Start Date:  10/24/2020                           Expected End Date:  04/26/2021                     Follow Up Date 11/26/2020    - check blood pressure weekly    Why is this important?    You won't feel high blood pressure, but it can still hurt your blood vessels.   High blood pressure can cause heart or kidney problems. It can also cause a stroke.   Making lifestyle changes like losing a little weight or eating less salt will help.   Checking your blood pressure at home and at different times of the day can help to control blood pressure.   If the doctor prescribes medicine remember to take it the way the doctor ordered.   Call the office if you cannot afford the medicine or if there are questions about it.     Notes:        Patient Care Plan: General Pharmacy (Adult)    Problem Identified: Hypertension, Hyperlipidemia, Chronic Kidney Disease and History of TIA   Priority: High    Long-Range Goal: Patient-Specific Goal   Start Date: 10/24/2020  Expected End Date: 04/26/2021  This Visit's Progress: On track  Priority: High  Note:   Current Barriers:  . No Barriers Noted  Pharmacist Clinical Goal(s):  [name] Patient will maintain control of blood pressure as evidenced by BP less than 140/90  through collaboration with PharmD and provider.   Interventions: . 1:1 collaboration with Libby Maw, MD regarding development and update of comprehensive plan of care as evidenced by provider attestation and co-signature . Inter-disciplinary care team collaboration (see longitudinal plan of care) . Comprehensive medication review performed; medication list updated in electronic medical  record  Hypertension (BP goal <140/90) -Controlled -Current treatment: . Amlodipine 10 mg daily  . Chlorthalidone 25 mg daily  . Spironolactone 25 mg daily  -Medications previously tried: NA  -Current home readings: [id number] systolic -Current dietary habits: Tries to minimize salt intake -Current exercise habits: Tries to walk  -Denies hypotensive/hypertensive symptoms -Educated on Daily salt intake goal < 2300 mg; Exercise goal of 150 minutes per week; -Counseled to monitor BP at home weekly, document, and provide log at future appointments -Recommended to continue current medication  -Recommended to increase hydration to 50-60 oz daily   Hyperlipidemia: (LDL goal < 100) -Controlled -Current treatment: . Simvastatin 10 mg daily  -Current antiplatelet treatment: . Clopidogrel 75 mg daily   -Medications previously tried: NA  -Educated on Importance of limiting foods high in cholesterol; -Recommended to continue current medication   Patient Goals/Self-Care Activities . Patient will:  - check blood pressure weekly, document, and provide at future appointments increase water intake (goal 60 oz daily)  Follow Up Plan: Telephone follow up appointment with care management team member scheduled for:  04/24/2021 at 11:00 AM      Patient agreed to services and verbal consent obtained.   The patient verbalized understanding of  instructions, educational materials, and care plan provided today and declined offer to receive copy of patient instructions, educational materials, and care plan.   [name], PharmD, CPP Clinical Pharmacist [facility]  [phone]

## 2020-10-25 MED ORDER — CLOPIDOGREL BISULFATE 75 MG PO TABS: 75.0000 mg | ORAL_TABLET | Freq: Every morning | ORAL | 0 refills | Status: DC

## 2020-10-28 ENCOUNTER — Encounter: Payer: Self-pay | Admitting: Family Medicine

## 2020-10-30 DIAGNOSIS — H9110 Presbycusis, unspecified ear: Secondary | ICD-10-CM | POA: Insufficient documentation

## 2020-10-30 NOTE — Addendum Note (Signed)
Addended by: Jon Billings on: 10/30/2020 07:53 AM   Modules accepted: Orders

## 2020-11-18 ENCOUNTER — Other Ambulatory Visit: Payer: Self-pay | Admitting: Family Medicine

## 2020-11-18 DIAGNOSIS — E7800 Pure hypercholesterolemia, unspecified: Secondary | ICD-10-CM

## 2020-11-18 DIAGNOSIS — I6523 Occlusion and stenosis of bilateral carotid arteries: Secondary | ICD-10-CM

## 2020-11-21 ENCOUNTER — Telehealth: Payer: Self-pay

## 2020-11-21 NOTE — Progress Notes (Signed)
    Chronic Care Management Pharmacy Assistant   Name: [name]  MRN: [id number] DOB: Feb 17, 1928  Reason for Encounter: Medication Review /medication Coordination Call.   Recent office visits:  No recent Office Visit  Recent consult visits:  No recent [facility] visits:  None in previous 6 months  Medications: Outpatient Encounter Medications as of 11/21/2020  Medication Sig  . acetaminophen (TYLENOL) 500 MG tablet Take 500 mg by mouth every 6 (six) hours as needed.  [name] amLODipine (NORVASC) 10 MG tablet Take 1 tablet (10 mg total) by mouth daily.  . chlorthalidone (HYGROTON) 25 MG tablet TAKE ONE TABLET BY MOUTH EVERY MORNING  . Cholecalciferol (VITAMIN D3) 2000 units TABS Take 1 tablet by mouth daily.  . clopidogrel (PLAVIX) 75 MG tablet Take 1 tablet (75 mg total) by mouth every morning.  . docusate sodium (COLACE) 100 MG capsule Take 1 capsule (100 mg total) by mouth 2 (two) times daily.  [name] gabapentin (NEURONTIN) 300 MG capsule TAKE ONE CAPSULE BY MOUTH EVERY MORNING and TAKE ONE CAPSULE BY MOUTH EVERY EVENING and TAKE ONE CAPSULE BY MOUTH EVERYDAY AT BEDTIME  . SHINGRIX injection   . simvastatin (ZOCOR) 10 MG tablet TAKE ONE TABLET BY MOUTH EVERYDAY AT BEDTIME  . spironolactone (ALDACTONE) 25 MG tablet TAKE ONE TABLET BY MOUTH EVERY MORNING  . vitamin C (ASCORBIC ACID) 500 MG tablet Take 500 mg by mouth daily.   No facility-administered encounter medications on file as of 11/21/2020.    Star Rating Drugs: Simvastatin 25 mg last filled on 10/29/2020 for 30 day supply at [facility].  Reviewed chart for medication changes ahead of medication coordination call.   BP Readings from Last 3 Encounters:  10/18/20 138/70  10/17/20 (!) 161/89  04/05/20 136/78    No results found for: HGBA1C   Patient obtains medications through Adherence Packaging  30 Days   Last adherence delivery included:   Clopidogrel 75 mg daily -breakfast  Gabapentin  300 mg one tablet three times daily -breakfast, evening meal, bedtime  Simvastatin 10 mg daily -bedtime  Chlorthalidone 25 mg daily -breakfast  Vitamin D3 2000 units daily -breakfast  Vitamin C 500 mg daily -breakfast  Amlodipine 10 mg daily -breakfast Spironolactone 25 mg daily-breakfast Docusate sodium 100 mg twice daily- breakfast, evening meal  Patient declined medications last month: None ID  Patient is due for next adherence delivery on: 11/28/2020. Called patient and reviewed medications and coordinated delivery.  This delivery to include:  Clopidogrel 75 mg daily -breakfast  Gabapentin 300 mg one tablet three times daily -breakfast, evening meal, bedtime  Simvastatin 10 mg daily -bedtime  Chlorthalidone 25 mg daily -breakfast  Vitamin D3 2000 units daily -breakfast  Vitamin C 500 mg daily -breakfast  Amlodipine 10 mg daily -breakfast Spironolactone 25 mg daily-breakfast Docusate sodium 100 mg twice daily- breakfast, evening meal  Patient declined the following medications: None ID  Patient needs refills for None ID.  Confirmed delivery date of 11/28/2020, advised patient that pharmacy will contact them the morning of delivery.  [place] Pharmacist Assistant [phone]

## 2020-12-05 DIAGNOSIS — Z9889 Other specified postprocedural states: Secondary | ICD-10-CM | POA: Diagnosis not present

## 2020-12-05 DIAGNOSIS — Z8669 Personal history of other diseases of the nervous system and sense organs: Secondary | ICD-10-CM | POA: Diagnosis not present

## 2020-12-05 DIAGNOSIS — H6123 Impacted cerumen, bilateral: Secondary | ICD-10-CM | POA: Diagnosis not present

## 2020-12-05 DIAGNOSIS — Z974 Presence of external hearing-aid: Secondary | ICD-10-CM | POA: Diagnosis not present

## 2020-12-05 DIAGNOSIS — H9191 Unspecified hearing loss, right ear: Secondary | ICD-10-CM | POA: Diagnosis not present

## 2020-12-06 DIAGNOSIS — H903 Sensorineural hearing loss, bilateral: Secondary | ICD-10-CM | POA: Diagnosis not present

## 2020-12-14 ENCOUNTER — Other Ambulatory Visit: Payer: Self-pay | Admitting: Family

## 2020-12-14 DIAGNOSIS — I10 Essential (primary) hypertension: Secondary | ICD-10-CM

## 2020-12-22 ENCOUNTER — Telehealth: Payer: Self-pay

## 2020-12-22 NOTE — Progress Notes (Signed)
    Chronic Care Management Pharmacy Assistant   Name: [name]  MRN: [id number] DOB: October 14, 1927  Reason for Encounter: Medication Review/medication coordination call.   Recent office visits:  No recent Office Visit  Recent consult visits:  12/06/2020 [name] [facility] visits:  None in previous 6 months  Medications: Outpatient Encounter Medications as of 12/22/2020  Medication Sig  . acetaminophen (TYLENOL) 500 MG tablet Take 500 mg by mouth every 6 (six) hours as needed.  [name] amLODipine (NORVASC) 10 MG tablet Take 1 tablet (10 mg total) by mouth daily.  . chlorthalidone (HYGROTON) 25 MG tablet TAKE ONE TABLET BY MOUTH EVERY MORNING  . Cholecalciferol (VITAMIN D3) 2000 units TABS Take 1 tablet by mouth daily.  . clopidogrel (PLAVIX) 75 MG tablet Take 1 tablet (75 mg total) by mouth every morning.  . docusate sodium (COLACE) 100 MG capsule Take 1 capsule (100 mg total) by mouth 2 (two) times daily.  [name] gabapentin (NEURONTIN) 300 MG capsule TAKE ONE CAPSULE BY MOUTH EVERY MORNING and TAKE ONE CAPSULE BY MOUTH EVERY EVENING and TAKE ONE CAPSULE BY MOUTH EVERYDAY AT BEDTIME  . SHINGRIX injection   . simvastatin (ZOCOR) 10 MG tablet TAKE ONE TABLET BY MOUTH EVERYDAY AT BEDTIME  . spironolactone (ALDACTONE) 25 MG tablet TAKE ONE TABLET BY MOUTH EVERY MORNING  . vitamin C (ASCORBIC ACID) 500 MG tablet Take 500 mg by mouth daily.   No facility-administered encounter medications on file as of 12/22/2020.   Star Rating Drugs: Simvastatin 25 mg last filled on 11/24/2020 for 30 day supply at [facility].  Reviewed chart for medication changes ahead of medication coordination call.  BP Readings from Last 3 Encounters:  10/18/20 138/70  10/17/20 (!) 161/89  04/05/20 136/78    No results found for: HGBA1C   Patient obtains medications through Adherence Packaging  30 Days   Last adherence delivery included:   Clopidogrel 75 mg daily -breakfast   Gabapentin  300 mg one tablet three times daily -breakfast, evening meal, bedtime   Simvastatin 10 mg daily -bedtime   Chlorthalidone 25 mg daily -breakfast   Vitamin D3 2000 units daily -breakfast   Vitamin C 500 mg daily -breakfast   Amlodipine 10 mg daily -breakfast  Spironolactone 25 mg daily-breakfast  Docusate sodium 100 mg twice daily- breakfast, evening meal  Patient declined medications last month None ID   Patient is due for next adherence delivery on: 12/29/2020. Called patient and reviewed medications and coordinated delivery.  This delivery to include:  Clopidogrel 75 mg daily -breakfast   Gabapentin 300 mg one tablet three times daily -breakfast, evening meal, bedtime   Simvastatin 10 mg daily -bedtime   Chlorthalidone 25 mg daily -breakfast   Vitamin D3 2000 units daily -breakfast   Vitamin C 500 mg daily -breakfast   Amlodipine 10 mg daily -breakfast  Spironolactone 25 mg daily-breakfast  Docusate sodium 100 mg twice daily- breakfast, evening meal  Patient declined the following medications: None ID  Patient needs refills for Spironolactone 25 mg,Chlorthalidone 25 mg,.  Confirmed delivery date of 12/29/2020, advised patient that pharmacy will contact them the morning of delivery.  Patient is schedule for a follow up with PCP on 01/19/2021 at 10:30 am. Patient is schedule for a follow up telephone with Clinical Pharmacist on 04/24/2021 at 11:00 am.  [place] Pharmacist Assistant [phone]

## 2021-01-19 ENCOUNTER — Telehealth: Payer: Self-pay

## 2021-01-19 ENCOUNTER — Other Ambulatory Visit: Payer: Self-pay

## 2021-01-19 ENCOUNTER — Ambulatory Visit (INDEPENDENT_AMBULATORY_CARE_PROVIDER_SITE_OTHER): Payer: Medicare HMO | Admitting: Family Medicine

## 2021-01-19 ENCOUNTER — Encounter: Payer: Self-pay | Admitting: Family Medicine

## 2021-01-19 VITALS — BP 140/68 | HR 56 | Temp 97.6°F | Ht 70.0 in | Wt 184.0 lb

## 2021-01-19 DIAGNOSIS — N1831 Chronic kidney disease, stage 3a: Secondary | ICD-10-CM

## 2021-01-19 DIAGNOSIS — E538 Deficiency of other specified B group vitamins: Secondary | ICD-10-CM | POA: Insufficient documentation

## 2021-01-19 DIAGNOSIS — I6523 Occlusion and stenosis of bilateral carotid arteries: Secondary | ICD-10-CM

## 2021-01-19 DIAGNOSIS — R7309 Other abnormal glucose: Secondary | ICD-10-CM

## 2021-01-19 DIAGNOSIS — I10 Essential (primary) hypertension: Secondary | ICD-10-CM

## 2021-01-19 DIAGNOSIS — J8410 Pulmonary fibrosis, unspecified: Secondary | ICD-10-CM

## 2021-01-19 LAB — BASIC METABOLIC PANEL
BUN: 33 mg/dL — ABNORMAL HIGH (ref 6–23)
CO2: 25 mEq/L (ref 19–32)
Calcium: 9.1 mg/dL (ref 8.4–10.5)
Chloride: 100 mEq/L (ref 96–112)
Creatinine, Ser: 1.63 mg/dL — ABNORMAL HIGH (ref 0.40–1.50)
GFR: 36.67 mL/min — ABNORMAL LOW (ref >60.00)
Glucose, Bld: 93 mg/dL (ref 70–99)
Potassium: 4.4 mEq/L (ref 3.5–5.1)
Sodium: 136 mEq/L (ref 135–145)

## 2021-01-19 LAB — VITAMIN B12: Vitamin B-12: 1055 pg/mL — ABNORMAL HIGH (ref 211–911)

## 2021-01-19 LAB — HEMOGLOBIN A1C: Hgb A1c MFr Bld: 6.2 % (ref 4.6–6.5)

## 2021-01-19 NOTE — Progress Notes (Addendum)
Established Patient Office Visit  Subjective:  Patient ID: [name], male    DOB: 12-12-1927  Age: 87 y.o. MRN: [id number]  CC:  Chief Complaint  Patient presents with   Follow-up    3 month follow up on BP and labs. No concerns. Patient fasting for labs.     HPI [name] presents for hypertension, CKD, elevated glucose, carotid artery stenosis.  Blood pressure is controlled with amlodipine, chlorthalidone and spironolactone.  Patient continues to hydrate well for CKD.  He has no history of diabetes.  He is fasting this morning.  Continues with simvastatin for elevated cholesterol.  Last LDL was 80.  Carotid artery ultrasound did reveal moderate stenosis of both carotid arteries 3 years ago.  Patient has had no further TIA or visual changes.  He is exercising by walking.  Past Medical History:  Diagnosis Date   Branch retinal vein occlusion of left eye    High cholesterol    Hypertension    TIA (transient ischemic attack)     Past Surgical History:  Procedure Laterality Date   CAROTID ENDARTERECTOMY Right    HERNIA REPAIR     INNER EAR SURGERY Right    for hearing loss    Family History  Problem Relation Age of Onset   Lung cancer Father     Social History   Socioeconomic History   Marital status: Widowed    Spouse name: Not on file   Number of children: 4   Years of education: Not on file   Highest education level: Not on file  Occupational History    Comment: retired, 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  Tobacco Use   Smoking status: Never   Smokeless tobacco: Never  Vaping Use   Vaping Use: Never used  Substance and Sexual Activity   Alcohol use: Yes    Comment: 1 glass wine   Drug use: Never   Sexual activity: Not on file  Other Topics Concern   Not on file  Social History Narrative   Lives alone   Social Determinants of Health   Financial Resource Strain: Low Risk    Difficulty of Paying Living Expenses: Not hard at all  Food Insecurity:  Not on file  Transportation Needs: No Transportation Needs   Lack of Transportation (Medical): No   Lack of Transportation (Non-Medical): No  Physical Activity: Not on file  Stress: Not on file  Social Connections: Not on file  Intimate Partner Violence: Not on file    Outpatient Medications Prior to Visit  Medication Sig Dispense Refill   acetaminophen (TYLENOL) 500 MG tablet Take 500 mg by mouth every 6 (six) hours as needed.     amLODipine (NORVASC) 10 MG tablet Take 1 tablet (10 mg total) by mouth daily. 90 tablet 0   chlorthalidone (HYGROTON) 25 MG tablet TAKE ONE TABLET BY MOUTH EVERY MORNING 30 tablet 2   Cholecalciferol (VITAMIN D3) 2000 units TABS Take 1 tablet by mouth daily.     clopidogrel (PLAVIX) 75 MG tablet Take 1 tablet (75 mg total) by mouth every morning. 90 tablet 0   docusate sodium (COLACE) 100 MG capsule Take 1 capsule (100 mg total) by mouth 2 (two) times daily. 100 capsule 0   gabapentin (NEURONTIN) 300 MG capsule TAKE ONE CAPSULE BY MOUTH EVERY MORNING and TAKE ONE CAPSULE BY MOUTH EVERY EVENING and TAKE ONE CAPSULE BY MOUTH EVERYDAY AT BEDTIME 90 capsule 4   simvastatin (ZOCOR) 10 MG tablet TAKE ONE  TABLET BY MOUTH EVERYDAY AT BEDTIME 90 tablet 1   spironolactone (ALDACTONE) 25 MG tablet TAKE ONE TABLET BY MOUTH EVERY MORNING 30 tablet 1   vitamin C (ASCORBIC ACID) 500 MG tablet Take 500 mg by mouth daily.     SHINGRIX injection      No facility-administered medications prior to visit.    Allergies  Allergen Reactions   Codeine Other (See Comments)    No reaction noted   Lisinopril     hyperkalemia   Penicillins     REACTION: rash    ROS Review of Systems  Constitutional: Negative.   HENT: Negative.    Eyes:  Negative for photophobia and visual disturbance.  Respiratory: Negative.  Negative for chest tightness and shortness of breath.   Cardiovascular: Negative.  Negative for chest pain and palpitations.  Gastrointestinal: Negative.    Genitourinary: Negative.   Neurological:  Negative for speech difficulty, weakness, light-headedness and headaches.  Psychiatric/Behavioral: Negative.       Objective:    Physical Exam Vitals and nursing note reviewed.  Constitutional:      General: He is not in acute distress.    Appearance: Normal appearance. He is not ill-appearing, toxic-appearing or diaphoretic.  HENT:     Head: Normocephalic and atraumatic.     Right Ear: Tympanic membrane, ear canal and external ear normal.     Left Ear: Tympanic membrane, ear canal and external ear normal.     Mouth/Throat:     Mouth: Mucous membranes are moist.     Pharynx: Oropharynx is clear. No oropharyngeal exudate or posterior oropharyngeal erythema.  Eyes:     General: No scleral icterus.       Right eye: No discharge.        Left eye: No discharge.     Extraocular Movements: Extraocular movements intact.     Conjunctiva/sclera: Conjunctivae normal.     Pupils: Pupils are equal, round, and reactive to light.  Neck:     Vascular: No carotid bruit.  Cardiovascular:     Rate and Rhythm: Normal rate and regular rhythm.  Pulmonary:     Effort: Pulmonary effort is normal.     Breath sounds: Examination of the right-lower field reveals rhonchi. Examination of the left-lower field reveals rhonchi. Rhonchi present.  Abdominal:     General: Bowel sounds are normal.  Musculoskeletal:     Cervical back: No rigidity or tenderness.     Right lower leg: No edema.     Left lower leg: No edema.  Lymphadenopathy:     Cervical: No cervical adenopathy.  Skin:    General: Skin is warm and dry.  Neurological:     Mental Status: He is alert and oriented to person, place, and time.    BP 140/68   Pulse (!) 56   Temp 97.6 F (36.4 C) (Temporal)   Ht '5\' 10"'$  (1.778 m)   Wt 184 lb (83.5 kg)   SpO2 97%   BMI 26.40 kg/m  Wt Readings from Last 3 Encounters:  01/19/21 184 lb (83.5 kg)  10/18/20 185 lb 6.4 oz (84.1 kg)  10/17/20 182 lb  (82.6 kg)     There are no preventive care reminders to display for this patient.  There are no preventive care reminders to display for this patient.  Lab Results  Component Value Date   TSH 2.650 04/04/2018   Lab Results  Component Value Date   WBC 8.6 10/18/2020   HGB 14.3 10/18/2020  HCT 43.5 10/18/2020   MCV 85.4 10/18/2020   PLT 229.0 10/18/2020   Lab Results  Component Value Date   NA 136 01/19/2021   K 4.4 01/19/2021   CO2 25 01/19/2021   GLUCOSE 93 01/19/2021   BUN 33 (H) 01/19/2021   CREATININE 1.63 (H) 01/19/2021   BILITOT 0.6 05/15/2019   ALKPHOS 112 05/15/2019   AST 13 05/15/2019   ALT 9 05/15/2019   PROT 6.7 05/15/2019   ALBUMIN 4.0 05/15/2019   CALCIUM 9.1 01/19/2021   GFR 36.67 (L) 01/19/2021   Lab Results  Component Value Date   CHOL 148 10/18/2020   Lab Results  Component Value Date   HDL 42.00 10/18/2020   Lab Results  Component Value Date   LDLCALC 80 10/18/2020   Lab Results  Component Value Date   TRIG 128.0 10/18/2020   Lab Results  Component Value Date   CHOLHDL 4 10/18/2020   Lab Results  Component Value Date   HGBA1C 6.2 01/19/2021      Assessment & Plan:   Problem List Items Addressed This Visit       Cardiovascular and Mediastinum   Essential hypertension - Primary   Relevant Orders   Basic metabolic panel (Completed)   Carotid disease, bilateral ([place])   Relevant Orders   VAS US CAROTID     Respiratory   Fibrotic lung diseases ([place])     Genitourinary   Stage 3a chronic kidney disease ([place])   Relevant Orders   Basic metabolic panel (Completed)   Protein Electrophoresis, (serum)   Protein Electrophoresis, Urine Rflx.     Other   Elevated glucose   Relevant Orders   Hemoglobin A1c (Completed)   Vitamin B 12 deficiency   Relevant Orders   Vitamin B12 (Completed)    No orders of the defined types were placed in this encounter.   Follow-up: Return in about 3 months (around 04/21/2021).     Libby Maw, MD

## 2021-01-19 NOTE — Progress Notes (Signed)
    Chronic Care Management Pharmacy Assistant   Name: [name]  MRN: [id number] DOB: 09/25/1927  Reason for Encounter: Medication Review/Medication Coordination Call.   Recent office visits:  No recent Office Visit  Recent consult visits:  No recent [facility] visits:  None in previous 6 months  Medications: Outpatient Encounter Medications as of 01/19/2021  Medication Sig   acetaminophen (TYLENOL) 500 MG tablet Take 500 mg by mouth every 6 (six) hours as needed.   amLODipine (NORVASC) 10 MG tablet Take 1 tablet (10 mg total) by mouth daily.   chlorthalidone (HYGROTON) 25 MG tablet TAKE ONE TABLET BY MOUTH EVERY MORNING   Cholecalciferol (VITAMIN D3) 2000 units TABS Take 1 tablet by mouth daily.   clopidogrel (PLAVIX) 75 MG tablet Take 1 tablet (75 mg total) by mouth every morning.   docusate sodium (COLACE) 100 MG capsule Take 1 capsule (100 mg total) by mouth 2 (two) times daily.   gabapentin (NEURONTIN) 300 MG capsule TAKE ONE CAPSULE BY MOUTH EVERY MORNING and TAKE ONE CAPSULE BY MOUTH EVERY EVENING and TAKE ONE CAPSULE BY MOUTH EVERYDAY AT BEDTIME   SHINGRIX injection    simvastatin (ZOCOR) 10 MG tablet TAKE ONE TABLET BY MOUTH EVERYDAY AT BEDTIME   spironolactone (ALDACTONE) 25 MG tablet TAKE ONE TABLET BY MOUTH EVERY MORNING   vitamin C (ASCORBIC ACID) 500 MG tablet Take 500 mg by mouth daily.   No facility-administered encounter medications on file as of 01/19/2021.    Care Gaps: None ID Star Rating Drugs: Simvastatin 25 mg last filled on 12/27/2020 for 30 day supply at [place] [facility] chart for medication changes ahead of medication coordination call.  BP Readings from Last 3 Encounters:  10/18/20 138/70  10/17/20 (!) 161/89  04/05/20 136/78    No results found for: HGBA1C   Patient obtains medications through Adherence Packaging  30 Days   Last adherence delivery included:  Clopidogrel 75 mg daily - breakfast Gabapentin  300 mg one tablet three times daily - breakfast, evening meal, bedtime Simvastatin 10 mg daily - bedtime Chlorthalidone 25 mg daily - breakfast Vitamin D3 2000 units daily - breakfast Vitamin C 500 mg daily - breakfast Amlodipine 10 mg daily - breakfast  Spironolactone 25 mg daily-breakfast Docusate sodium 100 mg twice daily- breakfast, evening meal    Patient declined medication last month None ID Patient is due for next adherence delivery on: 01/27/2021. Called patient and reviewed medications and coordinated delivery.  This delivery to include: Clopidogrel 75 mg daily - breakfast Gabapentin 300 mg one tablet three times daily - breakfast, evening meal, bedtime Simvastatin 10 mg daily - bedtime Chlorthalidone 25 mg daily - breakfast Vitamin D3 2000 units daily - breakfast Vitamin C 500 mg daily - breakfast Amlodipine 10 mg daily - breakfast  Spironolactone 25 mg daily-breakfast Docusate sodium 100 mg twice daily- breakfast, evening meal   Vitamin B12 1000 MG daily -Breakfast  Patient declined the following medications  Saline Nose Solution filled on 01/17/2021 for 30 day supply Retaine Eye Drops 0.5% filled on 01/17/2021 for 30 day supply  Patient needs refills for None ID.  Confirmed delivery date of 01/27/2021, advised patient that pharmacy will contact them the morning of delivery.  Patient is schedule for a telephone follow up with clinical pharmacist on 04/24/2021 at 11:00 am.  [place] Pharmacist Assistant [phone]

## 2021-01-20 NOTE — Addendum Note (Signed)
Addended by: Abelino Derrick A on: 01/20/2021 01:02 PM   Modules accepted: Orders

## 2021-01-24 DIAGNOSIS — R609 Edema, unspecified: Secondary | ICD-10-CM | POA: Diagnosis not present

## 2021-01-24 DIAGNOSIS — N529 Male erectile dysfunction, unspecified: Secondary | ICD-10-CM | POA: Diagnosis not present

## 2021-01-24 DIAGNOSIS — Z008 Encounter for other general examination: Secondary | ICD-10-CM | POA: Diagnosis not present

## 2021-01-24 DIAGNOSIS — K5900 Constipation, unspecified: Secondary | ICD-10-CM | POA: Diagnosis not present

## 2021-01-24 DIAGNOSIS — E785 Hyperlipidemia, unspecified: Secondary | ICD-10-CM | POA: Diagnosis not present

## 2021-01-24 DIAGNOSIS — J8410 Pulmonary fibrosis, unspecified: Secondary | ICD-10-CM | POA: Diagnosis not present

## 2021-01-24 DIAGNOSIS — E663 Overweight: Secondary | ICD-10-CM | POA: Diagnosis not present

## 2021-01-24 DIAGNOSIS — G629 Polyneuropathy, unspecified: Secondary | ICD-10-CM | POA: Diagnosis not present

## 2021-01-24 DIAGNOSIS — Z6827 Body mass index (BMI) 27.0-27.9, adult: Secondary | ICD-10-CM | POA: Diagnosis not present

## 2021-01-24 DIAGNOSIS — G3184 Mild cognitive impairment, so stated: Secondary | ICD-10-CM | POA: Diagnosis not present

## 2021-01-24 DIAGNOSIS — I10 Essential (primary) hypertension: Secondary | ICD-10-CM | POA: Diagnosis not present

## 2021-01-25 ENCOUNTER — Other Ambulatory Visit: Payer: Self-pay | Admitting: Family

## 2021-01-25 ENCOUNTER — Other Ambulatory Visit: Payer: Self-pay | Admitting: Family Medicine

## 2021-01-25 DIAGNOSIS — G459 Transient cerebral ischemic attack, unspecified: Secondary | ICD-10-CM

## 2021-01-25 DIAGNOSIS — G629 Polyneuropathy, unspecified: Secondary | ICD-10-CM

## 2021-01-25 DIAGNOSIS — I10 Essential (primary) hypertension: Secondary | ICD-10-CM

## 2021-01-26 NOTE — Telephone Encounter (Signed)
Please see refill request.

## 2021-01-27 ENCOUNTER — Other Ambulatory Visit: Payer: Self-pay

## 2021-01-27 ENCOUNTER — Other Ambulatory Visit (INDEPENDENT_AMBULATORY_CARE_PROVIDER_SITE_OTHER): Payer: Medicare HMO

## 2021-01-27 DIAGNOSIS — N1831 Chronic kidney disease, stage 3a: Secondary | ICD-10-CM | POA: Diagnosis not present

## 2021-01-27 NOTE — Progress Notes (Signed)
Pt is here for labs, pt tolerated draw well.

## 2021-01-27 NOTE — Telephone Encounter (Signed)
Last ov 01/19/2021 - Need visit 04/21/2021.

## 2021-01-31 LAB — PROTEIN ELECTROPHORESIS, URINE REFLEX
Albumin ELP, Urine: 100 %
Alpha-1-Globulin, U: 0 %
Alpha-2-Globulin, U: 0 %
Beta Globulin, U: 0 %
Gamma Globulin, U: 0 %
Protein, Ur: 10.4 mg/dL

## 2021-02-01 ENCOUNTER — Ambulatory Visit (HOSPITAL_COMMUNITY)
Admission: RE | Admit: 2021-02-01 | Discharge: 2021-02-01 | Disposition: A | Discharge status: Home / Self Care | Payer: Medicare HMO | Source: Ambulatory Visit | Attending: Family Medicine | Admitting: Family Medicine

## 2021-02-01 ENCOUNTER — Other Ambulatory Visit: Payer: Self-pay

## 2021-02-01 DIAGNOSIS — I6523 Occlusion and stenosis of bilateral carotid arteries: Secondary | ICD-10-CM

## 2021-02-01 NOTE — Progress Notes (Signed)
Carotid arterial duplex has been completed.   Results can be found under chart review under CV PROC. 02/01/2021 1:47 PM Isacc Turney RVT, RDMS

## 2021-02-03 LAB — PROTEIN ELECTROPHORESIS, SERUM
Albumin ELP: 3.8 g/dL (ref 3.8–4.8)
Alpha 1: 0.3 g/dL (ref 0.2–0.3)
Alpha 2: 1 g/dL — ABNORMAL HIGH (ref 0.5–0.9)
Beta 2: 0.4 g/dL (ref 0.2–0.5)
Beta Globulin: 0.5 g/dL (ref 0.4–0.6)
Gamma Globulin: 1.1 g/dL (ref 0.8–1.7)
Total Protein: 7.1 g/dL (ref 6.1–8.1)

## 2021-02-17 ENCOUNTER — Telehealth: Payer: Self-pay

## 2021-02-17 NOTE — Addendum Note (Signed)
Addended by: Lynda Rainwater on: 02/17/2021 03:51 PM   Modules accepted: Orders

## 2021-02-17 NOTE — Progress Notes (Signed)
    Chronic Care Management Pharmacy Assistant   Name: [name]  MRN: [id number] DOB: 06-01-1928  Reason for Encounter: Medication Review/Medication Coordination Call.   Recent office visits:  01/19/2021 Dr.Kremer MD (PCP)  Recent consult visits:  No recent [facility] visits:  None in previous 6 months  Medications: Outpatient Encounter Medications as of 02/17/2021  Medication Sig   acetaminophen (TYLENOL) 500 MG tablet Take 500 mg by mouth every 6 (six) hours as needed.   amLODipine (NORVASC) 10 MG tablet TAKE ONE TABLET BY MOUTH EVERY MORNING   chlorthalidone (HYGROTON) 25 MG tablet TAKE ONE TABLET BY MOUTH EVERY MORNING   Cholecalciferol (VITAMIN D3) 2000 units TABS Take 1 tablet by mouth daily.   clopidogrel (PLAVIX) 75 MG tablet TAKE ONE TABLET BY MOUTH EVERY MORNING   docusate sodium (COLACE) 100 MG capsule Take 1 capsule (100 mg total) by mouth 2 (two) times daily.   gabapentin (NEURONTIN) 300 MG capsule TAKE ONE CAPSULE EVERY MORNING and TAKE ONE CAPSULE EVERY EVENING and TAKE ONE CAPSULE EVERYDAY AT BEDTIME   simvastatin (ZOCOR) 10 MG tablet TAKE ONE TABLET BY MOUTH EVERYDAY AT BEDTIME   spironolactone (ALDACTONE) 25 MG tablet TAKE ONE TABLET BY MOUTH EVERY MORNING   vitamin C (ASCORBIC ACID) 500 MG tablet Take 500 mg by mouth daily.   No facility-administered encounter medications on file as of 02/17/2021.    Care Gaps: N/A Star Rating Drugs: Simvastatin 25 mg last filled on 01/25/2021 for 30 day supply at [facility] Medication Fill Gaps: N/A  Reviewed chart for medication changes ahead of medication coordination call.  BP Readings from Last 3 Encounters:  01/19/21 140/68  10/18/20 138/70  10/17/20 (!) 161/89    Lab Results  Component Value Date   HGBA1C 6.2 01/19/2021     Patient obtains medications through Adherence Packaging  30 Days   Last adherence delivery included:  Clopidogrel 75 mg daily - breakfast Gabapentin 300 mg  one tablet three times daily - breakfast, evening meal, bedtime Simvastatin 10 mg daily - bedtime Chlorthalidone 25 mg daily - breakfast Vitamin D3 2000 units daily - breakfast Vitamin C 500 mg daily - breakfast Amlodipine 10 mg daily - breakfast  Spironolactone 25 mg daily-breakfast Docusate sodium 100 mg twice daily- breakfast, evening meal   Vitamin B12 1000 MG daily -Breakfast    Patient declined medication last month  Saline Nose Solution filled on 01/17/2021 for 30 day supply Retaine Eye Drops 0.5% filled on 01/17/2021 for 30 day supply  Patient is due for next adherence delivery on: 02/28/2021. Called patient and reviewed medications and coordinated delivery.  This delivery to include: Clopidogrel 75 mg daily - breakfast Gabapentin 300 mg one tablet three times daily - breakfast, evening meal, bedtime Simvastatin 10 mg daily - bedtime Chlorthalidone 25 mg daily - breakfast Vitamin D3 2000 units daily - breakfast Vitamin C 500 mg daily - breakfast Amlodipine 10 mg daily - breakfast  Spironolactone 25 mg daily-breakfast Docusate sodium 100 mg twice daily- breakfast, evening meal   Vitamin B12 1000 MG daily -Breakfast  Patient declined the following medications Saline Nose Solution filled on 01/17/2021 for 30 day supply Retaine Eye Drops 0.5% filled on 01/17/2021 for 30 day supply  Patient needs refills for None ID.  Confirmed delivery date of 02/28/2021, advised patient that pharmacy will contact them the morning of delivery.  [facility] Pharmacist Assistant [phone]

## 2021-03-07 ENCOUNTER — Ambulatory Visit: Payer: Medicare HMO | Admitting: Vascular Surgery

## 2021-03-13 ENCOUNTER — Encounter: Payer: Self-pay | Admitting: Surgery

## 2021-03-13 ENCOUNTER — Other Ambulatory Visit: Payer: Self-pay

## 2021-03-13 ENCOUNTER — Ambulatory Visit: Payer: Medicare HMO | Admitting: Surgery

## 2021-03-13 VITALS — BP 168/83 | HR 64 | Temp 98.1°F | Resp 20 | Ht 70.0 in | Wt 182.0 lb

## 2021-03-13 DIAGNOSIS — I6523 Occlusion and stenosis of bilateral carotid arteries: Secondary | ICD-10-CM

## 2021-03-13 NOTE — Progress Notes (Signed)
[facility]ist [facility] [place]  Patient name: [name] MRN: [id number] DOB: 02-09-1928 Sex: male   REQUESTING PROVIDER:    Dr. Ethelene Hal   REASON FOR CONSULT:    Carotid stenosis  HISTORY OF PRESENT ILLNESS:   [name] is a 87 y.o. male, who is a former patient of Dr. Bridgett Larsson.  He inherited him in 2019.  The patient has previously undergone a right carotid endarterectomy with Dr. Maryjean Morn for a TIA.  This was a brief period of left arm weakness as well as syncope with expressive aphasia.  He no longer has any neurologic deficits.  He has not been seen in our office since 2019 secondary to [place].  He remains asymptomatic.   The patient is medically managed for hypertension.  He takes a statin for hypercholesterolemia.  He is a non-smoker.  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  Tobacco Use   Smoking status: Never   Smokeless tobacco: Never  Vaping Use   Vaping Use: Never used  Substance and Sexual Activity   Alcohol use: Yes    Comment: 1 glass wine   Drug use: Never   Sexual activity: Not on file  Other Topics Concern   Not on file  Social History Narrative   Lives alone   Social Determinants of Health   Financial Resource Strain: Low Risk    Difficulty of Paying Living Expenses: Not hard at all  Food Insecurity: Not on file  Transportation Needs: No Transportation Needs   Lack of  Transportation (Medical): No   Lack of Transportation (Non-Medical): No  Physical Activity: Not on file  Stress: Not on file  Social Connections: Not on file  Intimate Partner Violence: Not on file    ALLERGIES:    Allergies  Allergen Reactions   Clonidine     Other reaction(s): Other (See Comments) Dry mouth and ineffective per patient.   Codeine Other (See Comments)    No reaction noted   Lisinopril     hyperkalemia   Penicillins     REACTION: rash    CURRENT MEDICATIONS:    Current Outpatient Medications  Medication Sig Dispense Refill   acetaminophen (TYLENOL) 500 MG tablet Take 500 mg by mouth every 6 (six) hours as needed.     amLODipine (NORVASC) 10 MG tablet TAKE ONE TABLET BY MOUTH EVERY MORNING 90 tablet 0   chlorthalidone (HYGROTON) 25 MG tablet TAKE ONE TABLET BY MOUTH EVERY MORNING 30 tablet 4   Cholecalciferol (VITAMIN D3) 2000 units TABS Take 1 tablet by mouth daily.     clopidogrel (PLAVIX) 75 MG tablet TAKE ONE TABLET BY MOUTH EVERY MORNING 90 tablet 0   docusate sodium (COLACE) 100 MG capsule Take 1 capsule (100 mg total) by mouth 2 (two) times daily. 100 capsule 0   gabapentin (NEURONTIN)  300 MG capsule TAKE ONE CAPSULE EVERY MORNING and TAKE ONE CAPSULE EVERY EVENING and TAKE ONE CAPSULE EVERYDAY AT BEDTIME 90 capsule 4   simvastatin (ZOCOR) 10 MG tablet TAKE ONE TABLET BY MOUTH EVERYDAY AT BEDTIME 90 tablet 1   spironolactone (ALDACTONE) 25 MG tablet TAKE ONE TABLET BY MOUTH EVERY MORNING 30 tablet 4   vitamin C (ASCORBIC ACID) 500 MG tablet Take 500 mg by mouth daily.     No current facility-administered medications for this visit.    REVIEW OF SYSTEMS:   '[X]'$  denotes positive finding, '[ ]'$  denotes negative finding Cardiac  Comments:  Chest pain or chest pressure:    Shortness of breath upon exertion:    Short of breath when lying flat:    Irregular heart rhythm:        Vascular    Pain in calf, thigh, or hip brought on by ambulation:    Pain  in feet at night that wakes you up from your sleep:     Blood clot in your veins:    Leg swelling:         Pulmonary    Oxygen at home:    Productive cough:     Wheezing:         Neurologic    Sudden weakness in arms or legs:     Sudden numbness in arms or legs:     Sudden onset of difficulty speaking or slurred speech:    Temporary loss of vision in one eye:     Problems with dizziness:         Gastrointestinal    Blood in stool:      Vomited blood:         Genitourinary    Burning when urinating:     Blood in urine:        Psychiatric    Major depression:         Hematologic    Bleeding problems:    Problems with blood clotting too easily:        Skin    Rashes or ulcers:        Constitutional    Fever or chills:     PHYSICAL EXAM:   Vitals:   03/13/21 1204 03/13/21 1206  BP: (!) 156/75 (!) 168/83  Pulse: 64   Resp: 20   Temp: 98.1 F (36.7 C)   SpO2: 96%   Weight: 182 lb (82.6 kg)   Height: '5\' 10"'$  (1.778 m)     GENERAL: The patient is a well-nourished male, in no acute distress. The vital signs are documented above. CARDIAC: There is a regular rate and rhythm.  VASCULAR: Palpable radial pulses bilaterally PULMONARY: Nonlabored respirations ABDOMEN: Soft and non-tender.  No pulsatile mass MUSCULOSKELETAL: There are no major deformities or cyanosis. NEUROLOGIC: No focal weakness or paresthesias are detected. SKIN: There are no ulcers or rashes noted. PSYCHIATRIC: The patient has a normal affect.  STUDIES:   I have reviewed the following duplex ultrasound: Right Carotid: The extracranial vessels were near-normal with only minimal  wall                 thickening or plaque.   Left Carotid: Velocities in the left ICA are consistent with a 40-59%  stenosis.                The ECA appears <50% stenosed.   Vertebrals:  Right vertebral artery demonstrates antegrade flow. Left  vertebral  artery was not visualized.  Subclavians: Left  subclavian artery had elevated velocities with post  stenotic               flow distally. Normal flow hemodynamics were seen in the  right               subclavian artery.   ASSESSMENT and PLAN   Carotid stenosis: The right carotid endarterectomy site remains widely patent.  There is moderate stenosis on the left, for which she is asymptomatic.  I discussed we would recommend intervention if he develops symptoms or progress to greater than 80%.  He remains very active and healthy.  He should continue his statin and antiplatelet medication.  I have him scheduled for follow-up in our office for repeat carotid studies in 1 year.   Leia Alf, MD, FACS [facility] [phone] Pager [phone]

## 2021-03-21 ENCOUNTER — Telehealth: Payer: Self-pay

## 2021-03-21 NOTE — Progress Notes (Signed)
    Chronic Care Management Pharmacy Assistant   Name: [name]  MRN: [id number] DOB: September 13, 1927  Reason for Encounter: Medication Review/Medication Coordination Call.   Recent office visits:  No Recent Office Visit  Recent consult visits:  03/13/2021 Dr. Trula Slade MD (Vascular Surgery) No medication [facility] visits:  None in previous 6 months  Medications: Outpatient Encounter Medications as of 03/21/2021  Medication Sig   acetaminophen (TYLENOL) 500 MG tablet Take 500 mg by mouth every 6 (six) hours as needed.   amLODipine (NORVASC) 10 MG tablet TAKE ONE TABLET BY MOUTH EVERY MORNING   chlorthalidone (HYGROTON) 25 MG tablet TAKE ONE TABLET BY MOUTH EVERY MORNING   Cholecalciferol (VITAMIN D3) 2000 units TABS Take 1 tablet by mouth daily.   clopidogrel (PLAVIX) 75 MG tablet TAKE ONE TABLET BY MOUTH EVERY MORNING   docusate sodium (COLACE) 100 MG capsule Take 1 capsule (100 mg total) by mouth 2 (two) times daily.   gabapentin (NEURONTIN) 300 MG capsule TAKE ONE CAPSULE EVERY MORNING and TAKE ONE CAPSULE EVERY EVENING and TAKE ONE CAPSULE EVERYDAY AT BEDTIME   simvastatin (ZOCOR) 10 MG tablet TAKE ONE TABLET BY MOUTH EVERYDAY AT BEDTIME   spironolactone (ALDACTONE) 25 MG tablet TAKE ONE TABLET BY MOUTH EVERY MORNING   vitamin C (ASCORBIC ACID) 500 MG tablet Take 500 mg by mouth daily.   No facility-administered encounter medications on file as of 03/21/2021.    Care Gaps: Influenza Vaccine Star Rating Drugs: Simvastatin 25 mg last filled on 02/22/2021 for 30 day supply at [facility] Medication Fill Gaps: N/A  Reviewed chart for medication changes ahead of medication coordination call.  BP Readings from Last 3 Encounters:  03/13/21 (!) 168/83  01/19/21 140/68  10/18/20 138/70    Lab Results  Component Value Date   HGBA1C 6.2 01/19/2021     Patient obtains medications through Adherence Packaging  30 Days   Last adherence delivery included:   Clopidogrel 75 mg daily - breakfast Gabapentin 300 mg one tablet three times daily - breakfast, evening meal, bedtime Simvastatin 10 mg daily - bedtime Chlorthalidone 25 mg daily - breakfast Vitamin D3 2000 units daily - breakfast Vitamin C 500 mg daily - breakfast Amlodipine 10 mg daily - breakfast  Spironolactone 25 mg daily-breakfast Docusate sodium 100 mg twice daily- breakfast, evening meal   Vitamin B12 1000 MG daily -Breakfast  Patient declined Medication last month  Saline Nose Solution filled on 01/17/2021 for 30 day supply Retaine Eye Drops 0.5% filled on 01/17/2021 for 30 day supply  Patient is due for next adherence delivery on: 03/29/2021. Called patient and reviewed medications and coordinated delivery.  I have attempted without success to contact this patient by phone three times to do his Medication Coordination call. I left a Voice message for patient to return my call.  LVM 08/23,08/24,08/25  [facility] will try to contact patient to coordinate patient delivery.  [place] Pharmacist Assistant [phone]

## 2021-04-05 ENCOUNTER — Ambulatory Visit (INDEPENDENT_AMBULATORY_CARE_PROVIDER_SITE_OTHER): Payer: Medicare HMO

## 2021-04-05 DIAGNOSIS — Z0000 Encounter for general adult medical examination without abnormal findings: Secondary | ICD-10-CM

## 2021-04-05 DIAGNOSIS — Z23 Encounter for immunization: Secondary | ICD-10-CM | POA: Diagnosis not present

## 2021-04-05 NOTE — Patient Instructions (Signed)
Health Maintenance, Male Adopting a healthy lifestyle and getting preventive care are important in promoting health and wellness. Ask your health care provider about: The right schedule for you to have regular tests and exams. Things you can do on your own to prevent diseases and keep yourself healthy. What should I know about diet, weight, and exercise? Eat a healthy diet  Eat a diet that includes plenty of vegetables, fruits, low-fat dairy products, and lean protein. Do not eat a lot of foods that are high in solid fats, added sugars, or sodium. Maintain a healthy weight Body mass index (BMI) is a measurement that can be used to identify possible weight problems. It estimates body fat based on height and weight. Your health care provider can help determine your BMI and help you achieve or maintain a healthy weight. Get regular exercise Get regular exercise. This is one of the most important things you can do for your health. Most adults should: Exercise for at least 150 minutes each week. The exercise should increase your heart rate and make you sweat (moderate-intensity exercise). Do strengthening exercises at least twice a week. This is in addition to the moderate-intensity exercise. Spend less time sitting. Even light physical activity can be beneficial. Watch cholesterol and blood lipids Have your blood tested for lipids and cholesterol at 85 years of age, then have this test every 5 years. You may need to have your cholesterol levels checked more often if: Your lipid or cholesterol levels are high. You are older than 85 years of age. You are at high risk for heart disease. What should I know about cancer screening? Many types of cancers can be detected early and may often be prevented. Depending on your health history and family history, you may need to have cancer screening at various ages. This may include screening for: Colorectal cancer. Prostate cancer. Skin cancer. Lung  cancer. What should I know about heart disease, diabetes, and high blood pressure? Blood pressure and heart disease High blood pressure causes heart disease and increases the risk of stroke. This is more likely to develop in people who have high blood pressure readings, are of African descent, or are overweight. Talk with your health care provider about your target blood pressure readings. Have your blood pressure checked: Every 3-5 years if you are 18-39 years of age. Every year if you are 40 years old or older. If you are between the ages of 65 and 75 and are a current or former smoker, ask your health care provider if you should have a one-time screening for abdominal aortic aneurysm (AAA). Diabetes Have regular diabetes screenings. This checks your fasting blood sugar level. Have the screening done: Once every three years after age 45 if you are at a normal weight and have a low risk for diabetes. More often and at a younger age if you are overweight or have a high risk for diabetes. What should I know about preventing infection? Hepatitis B If you have a higher risk for hepatitis B, you should be screened for this virus. Talk with your health care provider to find out if you are at risk for hepatitis B infection. Hepatitis C Blood testing is recommended for: Everyone born from 1945 through 1965. Anyone with known risk factors for hepatitis C. Sexually transmitted infections (STIs) You should be screened each year for STIs, including gonorrhea and chlamydia, if: You are sexually active and are younger than 85 years of age. You are older than 85 years   of age and your health care provider tells you that you are at risk for this type of infection. Your sexual activity has changed since you were last screened, and you are at increased risk for chlamydia or gonorrhea. Ask your health care provider if you are at risk. Ask your health care provider about whether you are at high risk for HIV.  Your health care provider may recommend a prescription medicine to help prevent HIV infection. If you choose to take medicine to prevent HIV, you should first get tested for HIV. You should then be tested every 3 months for as long as you are taking the medicine. Follow these instructions at home: Lifestyle Do not use any products that contain nicotine or tobacco, such as cigarettes, e-cigarettes, and chewing tobacco. If you need help quitting, ask your health care provider. Do not use street drugs. Do not share needles. Ask your health care provider for help if you need support or information about quitting drugs. Alcohol use Do not drink alcohol if your health care provider tells you not to drink. If you drink alcohol: Limit how much you have to 0-2 drinks a day. Be aware of how much alcohol is in your drink. In the [place] one drink equals one 12 oz bottle of beer (355 mL), one 5 oz glass of wine (148 mL), or one 1 oz glass of hard liquor (44 mL). General instructions Schedule regular health, dental, and eye exams. Stay current with your vaccines. Tell your health care provider if: You often feel depressed. You have ever been abused or do not feel safe at home. Summary Adopting a healthy lifestyle and getting preventive care are important in promoting health and wellness. Follow your health care provider's instructions about healthy diet, exercising, and getting tested or screened for diseases. Follow your health care provider's instructions on monitoring your cholesterol and blood pressure. This information is not intended to replace advice given to you by your health care provider. Make sure you discuss any questions you have with your health care provider. Document Revised: 09/23/2020 Document Reviewed: 07/09/2018 Elsevier Patient Education  2022 [employer]  

## 2021-04-05 NOTE — Progress Notes (Addendum)
Subjective:   [name] is a 87 y.o. male who presents for Medicare Annual/Subsequent preventive examination.  I connected with  [name] on 04/05/21 by a audio enabled telemedicine application and verified that I am speaking with the correct person using two identifiers. Full name and DOB. Location of patient: home Location of provider: office Persons participating in visit:   Jovan Scibelli and Armandina Gemma, cma      I discussed the limitations of evaluation and management by telemedicine. The patient expressed understanding and agreed to proceed.   Review of Systems    Deferred to PCP Cardiac Risk Factors include: none     Objective:    There were no vitals filed for this visit. There is no height or weight on file to calculate BMI.  Advanced Directives 04/05/2021 10/17/2020 10/14/2019 07/07/2018  Does Patient Have a Medical Advance Directive? Yes No No Yes  Type of Advance Directive Living will - - Living will  Does patient want to make changes to medical advance directive? No - Patient declined - - -  Would patient like information on creating a medical advance directive? - No - Patient declined No - Patient declined -    Current Medications (verified) Outpatient Encounter Medications as of 04/05/2021  Medication Sig   acetaminophen (TYLENOL) 500 MG tablet Take 500 mg by mouth every 6 (six) hours as needed.   amLODipine (NORVASC) 10 MG tablet TAKE ONE TABLET BY MOUTH EVERY MORNING   chlorthalidone (HYGROTON) 25 MG tablet TAKE ONE TABLET BY MOUTH EVERY MORNING   Cholecalciferol (VITAMIN D3) 2000 units TABS Take 1 tablet by mouth daily.   clopidogrel (PLAVIX) 75 MG tablet TAKE ONE TABLET BY MOUTH EVERY MORNING   docusate sodium (COLACE) 100 MG capsule Take 1 capsule (100 mg total) by mouth 2 (two) times daily.   gabapentin (NEURONTIN) 300 MG capsule TAKE ONE CAPSULE EVERY MORNING and TAKE ONE CAPSULE EVERY EVENING and TAKE ONE CAPSULE EVERYDAY AT BEDTIME    simvastatin (ZOCOR) 10 MG tablet TAKE ONE TABLET BY MOUTH EVERYDAY AT BEDTIME   spironolactone (ALDACTONE) 25 MG tablet TAKE ONE TABLET BY MOUTH EVERY MORNING   vitamin B-12 (CYANOCOBALAMIN) 1000 MCG tablet Take 1,000 mcg by mouth daily.   vitamin C (ASCORBIC ACID) 500 MG tablet Take 500 mg by mouth daily.   No facility-administered encounter medications on file as of 04/05/2021.    Allergies (verified) Clonidine, Codeine, Lisinopril, and Penicillins   History: Past Medical History:  Diagnosis Date   Branch retinal vein occlusion of left eye    High cholesterol    Hypertension    TIA (transient ischemic attack)    Past Surgical History:  Procedure Laterality Date   CAROTID ENDARTERECTOMY Right    HERNIA REPAIR     INNER EAR SURGERY Right    for hearing loss   Family History  Problem Relation Age of Onset   Lung cancer Father    Social History   Socioeconomic History   Marital status: Widowed    Spouse name: Not on file   Number of children: 4   Years of education: Not on file   Highest education level: Not on file  Occupational History    Comment: retired, 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  Tobacco Use   Smoking status: Never   Smokeless tobacco: Never  Vaping Use   Vaping Use: Never used  Substance and Sexual Activity   Alcohol use: Yes    Comment: 1 glass wine dialy  Drug use: Never   Sexual activity: Not Currently  Other Topics Concern   Not on file  Social History Narrative   Lives alone   Social Determinants of Health   Financial Resource Strain: Low Risk    Difficulty of Paying Living Expenses: Not hard at all  Food Insecurity: Not on file  Transportation Needs: No Transportation Needs   Lack of Transportation (Medical): No   Lack of Transportation (Non-Medical): No  Physical Activity: Not on file  Stress: Not on file  Social Connections: Not on file    Tobacco Counseling Counseling given: Not Answered   Clinical Intake:  Pre-visit  preparation completed: Yes  Pain : No/denies pain     Diabetes: No  How often do you need to have someone help you when you read instructions, pamphlets, or other written materials from your doctor or pharmacy?: 1 - Never  Diabetic?N/A  Interpreter Needed?: No      Activities of Daily Living In your present state of health, do you have any difficulty performing the following activities: 04/05/2021  Hearing? N  Vision? Y  Difficulty concentrating or making decisions? N  Walking or climbing stairs? Y  Dressing or bathing? N  Doing errands, shopping? N  Preparing Food and eating ? N  Using the Toilet? N  In the past six months, have you accidently leaked urine? N  Do you have problems with loss of bowel control? N  Managing your Medications? N  Managing your Finances? N  Housekeeping or managing your Housekeeping? N  Some recent data might be hidden    Patient Care Team: Libby Maw, MD as PCP - General (Family Medicine) Germaine Pomfret, [facility] as Pharmacist (Pharmacist)  Indicate any recent Medical Services you may have received from other than [facility] providers in the past year (date may be approximate).     Assessment:   This is a routine wellness examination for [facility].  Hearing/Vision screen No results found.  Dietary issues and exercise activities discussed: Current Exercise Habits: Home exercise routine (doing daily activies in home to keep active)   Goals Addressed   None    Depression Screen PHQ 2/9 Scores 04/05/2021 01/19/2021 10/18/2020 10/14/2019 07/27/2019  PHQ - 2 Score 0 0 0 0 0  PHQ- 9 Score 0 - - - -    Fall Risk Fall Risk  04/05/2021 01/19/2021 10/18/2020 10/26/2019 10/14/2019  Falls in the past year? 1 0 1 0 0  Number falls in past yr: 0 - 1 - 0  Injury with Fall? 0 - 0 - 0  Comment - - hit head no injury seen at ED - -  Risk for fall due to : History of fall(s) - - - -  Follow up Falls evaluation completed - - - Education provided;Falls  prevention discussed    FALL RISK PREVENTION PERTAINING TO THE HOME:  Any stairs in or around the home? Yes  If so, are there any without handrails? Yes  Home free of loose throw rugs in walkways, pet beds, electrical cords, etc? Yes  Adequate lighting in your home to reduce risk of falls? Yes   ASSISTIVE DEVICES UTILIZED TO PREVENT FALLS:  Life alert? No  Use of a cane, walker or w/c? Yes , cane Grab bars in the bathroom? Yes  Shower chair or bench in shower? Yes  Elevated toilet seat or a handicapped toilet? Yes   TIMED UP AND GO:  Was the test performed? N/A Length of time to  ambulate 10 FEET? N/A    Cognitive Function:     6CIT Screen 04/05/2021  What Year? 0 points  What month? 0 points  What time? 0 points  Count back from 20 0 points  Months in reverse 0 points    Immunizations Immunization History  Administered Date(s) Administered   Fluad Quad(high Dose 65+) 04/15/2019   Influenza Whole 06/19/2007   Influenza, High Dose Seasonal PF 05/02/2018   Influenza-Unspecified 05/13/2017, 05/28/2020   Moderna Sars-Covid-2 Vaccination 09/30/2019, 10/28/2019, 05/28/2020   Pneumococcal Conjugate-13 07/31/2015   Pneumococcal Polysaccharide-23 07/31/2015, 10/24/2016   Zoster Recombinat (Shingrix) 04/26/2020, 07/29/2020    TDAP status: Up to date  Flu Vaccine status: Due, Education has been provided regarding the importance of this vaccine. Advised may receive this vaccine at local pharmacy or Health Dept. Aware to provide a copy of the vaccination record if obtained from local pharmacy or Health Dept. Verbalized acceptance and understanding.  Pneumococcal vaccine status: Up to date  Covid-19 vaccine status: Completed vaccines  Qualifies for Shingles Vaccine?  N/A   Zostavax completed  N/A     Screening Tests Health Maintenance  Topic Date Due   INFLUENZA VACCINE  02/27/2021   TETANUS/TDAP  07/22/2028 (Originally 10/09/1946)   PNA vac Low Risk Adult  Completed    Zoster Vaccines- Shingrix  Completed   HPV VACCINES  Aged Out   COVID-19 Vaccine  Discontinued    Health Maintenance  Health Maintenance Due  Topic Date Due   INFLUENZA VACCINE  02/27/2021    Colorectal cancer screening: No longer required.  Aged out  Lung Cancer Screening: (Low Dose CT Chest recommended if Age 72-80 years, 30 pack-year currently smoking OR have quit w/in 15years.) does not qualify.   Lung Cancer Screening Referral: N/A  Additional Screening:  Hepatitis C Screening: does not qualify; Completed.N/A  Vision Screening: Recommended annual ophthalmology exams for early detection of glaucoma and other disorders of the eye. Is the patient up to date with their annual eye exam?  Yes  Who is the provider or what is the name of the office in which the patient attends annual eye exams?  Dr Isac Caddy, [place] If pt is not established with a provider, would they like to be referred to a provider to establish care? No .   Dental Screening: Recommended annual dental exams for proper oral hygiene  Community Resource Referral / Chronic Care Management: CRR required this visit?  No   CCM required this visit?  No      Plan:     I have personally reviewed and noted the following in the patient's chart:   Medical and social history Use of alcohol, tobacco or illicit drugs  Current medications and supplements including opioid prescriptions. Patient is not currently taking opioid prescriptions. Functional ability and status Nutritional status Physical activity Advanced directives List of other physicians Hospitalizations, surgeries, and ER visits in previous 12 months Vitals Screenings to include cognitive, depression, and falls Referrals and appointments  In addition, I have reviewed and discussed with patient certain preventive protocols, quality metrics, and best practice recommendations. A written personalized care plan for preventive services as well as general  preventive health recommendations were provided to patient.     Konrad Saha, [place]   04/05/2021   Nurse Notes:  35 Minutes encounter none face to face

## 2021-04-18 ENCOUNTER — Telehealth: Payer: Self-pay

## 2021-04-18 DIAGNOSIS — I10 Essential (primary) hypertension: Secondary | ICD-10-CM

## 2021-04-18 DIAGNOSIS — G459 Transient cerebral ischemic attack, unspecified: Secondary | ICD-10-CM

## 2021-04-18 NOTE — Progress Notes (Signed)
Chronic Care Management Pharmacy Assistant   Name: [name]  MRN: [id number] DOB: [birth year]  Reason for Encounter: Medication Review/Medication Coordination Call.   Recent office visits:  04/05/2021 Armandina Gemma CMA (PCP Office) Medicare Annual Wellness  Recent consult visits:  No recent [facility] visits:  None in previous 6 months  Medications: Outpatient Encounter Medications as of 04/18/2021  Medication Sig   acetaminophen (TYLENOL) 500 MG tablet Take 500 mg by mouth every 6 (six) hours as needed.   amLODipine (NORVASC) 10 MG tablet TAKE ONE TABLET BY MOUTH EVERY MORNING   chlorthalidone (HYGROTON) 25 MG tablet TAKE ONE TABLET BY MOUTH EVERY MORNING   Cholecalciferol (VITAMIN D3) 2000 units TABS Take 1 tablet by mouth daily.   clopidogrel (PLAVIX) 75 MG tablet TAKE ONE TABLET BY MOUTH EVERY MORNING   docusate sodium (COLACE) 100 MG capsule Take 1 capsule (100 mg total) by mouth 2 (two) times daily.   gabapentin (NEURONTIN) 300 MG capsule TAKE ONE CAPSULE EVERY MORNING and TAKE ONE CAPSULE EVERY EVENING and TAKE ONE CAPSULE EVERYDAY AT BEDTIME   simvastatin (ZOCOR) 10 MG tablet TAKE ONE TABLET BY MOUTH EVERYDAY AT BEDTIME   spironolactone (ALDACTONE) 25 MG tablet TAKE ONE TABLET BY MOUTH EVERY MORNING   vitamin B-12 (CYANOCOBALAMIN) 1000 MCG tablet Take 1,000 mcg by mouth daily.   vitamin C (ASCORBIC ACID) 500 MG tablet Take 500 mg by mouth daily.   No facility-administered encounter medications on file as of 04/18/2021.    Care Gaps: Influenza Vaccine HTN: 140/68 on 01/19/2021 [facility]: Simvastatin 25 mg last filled on 03/24/2021 for 30 day supply at [facility] Medication Fill Gaps: None  Reviewed chart for medication changes ahead of medication coordination call.  BP Readings from Last 3 Encounters:  03/13/21 (!) 168/83  01/19/21 140/68  10/18/20 138/70    Lab Results  Component Value Date   HGBA1C 6.2 01/19/2021      Patient obtains medications through Adherence Packaging  30 Days   Last adherence delivery included:  Clopidogrel 75 mg daily - breakfast Gabapentin 300 mg one tablet three times daily - breakfast, evening meal, bedtime Simvastatin 10 mg daily - bedtime Chlorthalidone 25 mg daily - breakfast Vitamin D3 2000 units daily - breakfast Vitamin C 500 mg daily - breakfast Amlodipine 10 mg daily - breakfast  Spironolactone 25 mg daily-breakfast Docusate sodium 100 mg twice daily- breakfast, evening meal  Vitamin B12 1000 MG daily -Breakfast Acetaminophen 500 mg PRN   Patient declined medication last month  Saline Nose Solution filled on 01/17/2021 for 30 day supply Retaine Eye Drops 0.5% filled on 01/17/2021 for 30 day supply  Patient is due for next adherence delivery on: 04/27/2021. Called patient and reviewed medications and coordinated delivery.  This delivery to include: Clopidogrel 75 mg daily - breakfast Gabapentin 300 mg one tablet three times daily - breakfast, evening meal, bedtime Simvastatin 10 mg daily - bedtime Chlorthalidone 25 mg daily - breakfast Vitamin D3 2000 units daily - breakfast Vitamin C 500 mg daily - breakfast Amlodipine 10 mg daily - breakfast  Spironolactone 25 mg daily-breakfast Docusate sodium 100 mg twice daily- breakfast, evening meal  Vitamin B12 1000 MG daily -Breakfast  Patient declined the following medications  Acetaminophen 500 mg PRN Saline Nose Solution filled on 01/17/2021 for 30 day supply Retaine Eye Drops 0.5% filled on 01/17/2021 for 30 day supply  Patient needs refills for Clopidogrel,Amlodipine Prescribe by PCP .  Confirmed delivery date of  04/27/2021, advised patient that pharmacy will contact them the morning of delivery.  [place] Pharmacist Assistant [phone]

## 2021-04-21 ENCOUNTER — Ambulatory Visit (INDEPENDENT_AMBULATORY_CARE_PROVIDER_SITE_OTHER): Payer: Medicare HMO | Admitting: Family Medicine

## 2021-04-21 ENCOUNTER — Encounter: Payer: Self-pay | Admitting: Family Medicine

## 2021-04-21 ENCOUNTER — Other Ambulatory Visit: Payer: Self-pay

## 2021-04-21 VITALS — BP 140/84 | HR 71 | Temp 97.4°F | Resp 18 | Wt 181.4 lb

## 2021-04-21 DIAGNOSIS — E538 Deficiency of other specified B group vitamins: Secondary | ICD-10-CM

## 2021-04-21 DIAGNOSIS — E559 Vitamin D deficiency, unspecified: Secondary | ICD-10-CM

## 2021-04-21 DIAGNOSIS — I6523 Occlusion and stenosis of bilateral carotid arteries: Secondary | ICD-10-CM

## 2021-04-21 DIAGNOSIS — E785 Hyperlipidemia, unspecified: Secondary | ICD-10-CM | POA: Diagnosis not present

## 2021-04-21 DIAGNOSIS — I10 Essential (primary) hypertension: Secondary | ICD-10-CM

## 2021-04-21 DIAGNOSIS — G629 Polyneuropathy, unspecified: Secondary | ICD-10-CM | POA: Diagnosis not present

## 2021-04-21 LAB — CBC
HCT: 43.2 % (ref 39.0–52.0)
Hemoglobin: 13.9 g/dL (ref 13.0–17.0)
MCHC: 32.2 g/dL (ref 30.0–36.0)
MCV: 86.2 fl (ref 78.0–100.0)
Platelets: 286 10*3/uL (ref 150.0–400.0)
RBC: 5.02 Mil/uL (ref 4.22–5.81)
RDW: 13.6 % (ref 11.5–15.5)
WBC: 9.1 10*3/uL (ref 4.0–10.5)

## 2021-04-21 LAB — COMPREHENSIVE METABOLIC PANEL
ALT: 10 U/L (ref 0–53)
AST: 13 U/L (ref 0–37)
Albumin: 4.2 g/dL (ref 3.5–5.2)
Alkaline Phosphatase: 89 U/L (ref 39–117)
BUN: 44 mg/dL — ABNORMAL HIGH (ref 6–23)
CO2: 23 mEq/L (ref 19–32)
Calcium: 9.2 mg/dL (ref 8.4–10.5)
Chloride: 100 mEq/L (ref 96–112)
Creatinine, Ser: 1.91 mg/dL — ABNORMAL HIGH (ref 0.40–1.50)
GFR: 30.26 mL/min — ABNORMAL LOW (ref >60.00)
Glucose, Bld: 98 mg/dL (ref 70–99)
Potassium: 4.4 mEq/L (ref 3.5–5.1)
Sodium: 135 mEq/L (ref 135–145)
Total Bilirubin: 0.7 mg/dL (ref 0.2–1.2)
Total Protein: 7.3 g/dL (ref 6.0–8.3)

## 2021-04-21 LAB — LIPID PANEL
Cholesterol: 136 mg/dL (ref 0–200)
HDL: 38.9 mg/dL — ABNORMAL LOW (ref >39.00)
LDL Cholesterol: 68 mg/dL (ref 0–99)
NonHDL: 96.79
Total CHOL/HDL Ratio: 3
Triglycerides: 142 mg/dL (ref 0.0–149.0)
VLDL: 28.4 mg/dL (ref 0.0–40.0)

## 2021-04-21 LAB — VITAMIN B12: Vitamin B-12: 1139 pg/mL — ABNORMAL HIGH (ref 211–911)

## 2021-04-21 LAB — VITAMIN D 25 HYDROXY (VIT D DEFICIENCY, FRACTURES): VITD: 52.59 ng/mL (ref 30.00–100.00)

## 2021-04-21 MED ORDER — AMLODIPINE BESYLATE 10 MG PO TABS: 10.0000 mg | ORAL_TABLET | Freq: Every morning | ORAL | 1 refills | Status: DC

## 2021-04-21 MED ORDER — GABAPENTIN 300 MG PO CAPS: ORAL_CAPSULE | ORAL | 3 refills | Status: DC

## 2021-04-21 MED ORDER — CLOPIDOGREL BISULFATE 75 MG PO TABS: 75.0000 mg | ORAL_TABLET | Freq: Every morning | ORAL | 1 refills | Status: DC

## 2021-04-21 NOTE — Progress Notes (Signed)
Established Patient Office Visit  Subjective:  Patient ID: [name], male    DOB: 11-Dec-1927  Age: 87 y.o. MRN: [id number]  CC:  Chief Complaint  Patient presents with   Follow-up    3 mo follow up,pt has brought living will, pt is fasting, pt declines flu tody would like to discuss pneumonia vaccine last 10/24/2016    HPI [name] presents for hypertension, hyperlipidemia with carotid artery stenosis, neuropathy in the lower extremities, vitamin D and B12 deficiency.  Has had his flu shot and COVID series.  Brings in his living well today.  We discussed that.  He wants comfort care.  His family is aware.  Continues amlodipine, chlorthalidone and spironolactone for blood pressure control.  Tries to avoid sodium but does not always do that.  Neuropathy seems to be a little worse at night.  Wonders if we could increase the dosage or adjusted.  Continues with B12 and vitamin D D supplementation.  Continues to live with his daughter.  Staying active by walking and home-improvement projects.  Past Medical History:  Diagnosis Date   Branch retinal vein occlusion of left eye    High cholesterol    Hypertension    TIA (transient ischemic attack)     Past Surgical History:  Procedure Laterality Date   CAROTID ENDARTERECTOMY Right    HERNIA REPAIR     INNER EAR SURGERY Right    for hearing loss    Family History  Problem Relation Age of Onset   Lung cancer Father     Social History   Socioeconomic History   Marital status: Widowed    Spouse name: Not on file   Number of children: 4   Years of education: Not on file   Highest education level: Not on file  Occupational History    Comment: retired, 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  Tobacco Use   Smoking status: Never   Smokeless tobacco: Never  Vaping Use   Vaping Use: Never used  Substance and Sexual Activity   Alcohol use: Yes    Comment: 1 glass wine dialy   Drug use: Never   Sexual activity: Not Currently   Other Topics Concern   Not on file  Social History Narrative   Lives alone   Social Determinants of Health   Financial Resource Strain: Low Risk    Difficulty of Paying Living Expenses: Not hard at all  Food Insecurity: Not on file  Transportation Needs: No Transportation Needs   Lack of Transportation (Medical): No   Lack of Transportation (Non-Medical): No  Physical Activity: Not on file  Stress: Not on file  Social Connections: Not on file  Intimate Partner Violence: Not on file    Outpatient Medications Prior to Visit  Medication Sig Dispense Refill   acetaminophen (TYLENOL) 500 MG tablet Take 500 mg by mouth every 6 (six) hours as needed.     amLODipine (NORVASC) 10 MG tablet Take 1 tablet (10 mg total) by mouth every morning. 90 tablet 1   chlorthalidone (HYGROTON) 25 MG tablet TAKE ONE TABLET BY MOUTH EVERY MORNING 30 tablet 4   Cholecalciferol (VITAMIN D3) 2000 units TABS Take 1 tablet by mouth daily.     clopidogrel (PLAVIX) 75 MG tablet Take 1 tablet (75 mg total) by mouth every morning. 90 tablet 1   docusate sodium (COLACE) 100 MG capsule Take 1 capsule (100 mg total) by mouth 2 (two) times daily. 100 capsule 0   simvastatin (ZOCOR)  10 MG tablet TAKE ONE TABLET BY MOUTH EVERYDAY AT BEDTIME 90 tablet 1   spironolactone (ALDACTONE) 25 MG tablet TAKE ONE TABLET BY MOUTH EVERY MORNING 30 tablet 4   vitamin B-12 (CYANOCOBALAMIN) 1000 MCG tablet Take 1,000 mcg by mouth daily.     vitamin C (ASCORBIC ACID) 500 MG tablet Take 500 mg by mouth daily.     gabapentin (NEURONTIN) 300 MG capsule TAKE ONE CAPSULE EVERY MORNING and TAKE ONE CAPSULE EVERY EVENING and TAKE ONE CAPSULE EVERYDAY AT BEDTIME 90 capsule 4   No facility-administered medications prior to visit.    Allergies  Allergen Reactions   Clonidine     Other reaction(s): Other (See Comments) Dry mouth and ineffective per patient.   Codeine Other (See Comments)    No reaction noted   Lisinopril     hyperkalemia    Penicillins     REACTION: rash    ROS Review of Systems  Constitutional: Negative.   HENT: Negative.    Respiratory: Negative.  Negative for chest tightness, shortness of breath and wheezing.   Cardiovascular:  Negative for chest pain and palpitations.  Gastrointestinal: Negative.   Endocrine: Negative for polyphagia and polyuria.  Genitourinary: Negative.   Neurological:  Negative for speech difficulty and headaches.  Psychiatric/Behavioral: Negative.       Objective:    Physical Exam Vitals and nursing note reviewed.  Constitutional:      General: He is not in acute distress.    Appearance: Normal appearance. He is not ill-appearing, toxic-appearing or diaphoretic.  Cardiovascular:     Rate and Rhythm: Normal rate and regular rhythm.  Pulmonary:     Effort: Pulmonary effort is normal.     Breath sounds: Examination of the right-lower field reveals rhonchi. Examination of the left-lower field reveals rhonchi. Rhonchi present.  Abdominal:     General: Bowel sounds are normal.  Musculoskeletal:     Right lower leg: No edema.     Left lower leg: No edema.  Skin:    General: Skin is warm and dry.  Neurological:     Mental Status: He is alert and oriented to person, place, and time.  Psychiatric:        Mood and Affect: Mood normal.        Behavior: Behavior normal.    BP 140/84 (BP Location: Left Arm, Patient Position: Sitting, Cuff Size: Normal)   Pulse 71   Temp (!) 97.4 F (36.3 C) (Temporal)   Resp 18   Wt 181 lb 6.4 oz (82.3 kg)   SpO2 96%   BMI 26.03 kg/m  Wt Readings from Last 3 Encounters:  04/21/21 181 lb 6.4 oz (82.3 kg)  03/13/21 182 lb (82.6 kg)  01/19/21 184 lb (83.5 kg)     Health Maintenance Due  Topic Date Due   INFLUENZA VACCINE  02/27/2021    There are no preventive care reminders to display for this patient.  Lab Results  Component Value Date   TSH 2.650 04/04/2018   Lab Results  Component Value Date   WBC 8.6 10/18/2020    HGB 14.3 10/18/2020   HCT 43.5 10/18/2020   MCV 85.4 10/18/2020   PLT 229.0 10/18/2020   Lab Results  Component Value Date   NA 136 01/19/2021   K 4.4 01/19/2021   CO2 25 01/19/2021   GLUCOSE 93 01/19/2021   BUN 33 (H) 01/19/2021   CREATININE 1.63 (H) 01/19/2021   BILITOT 0.6 05/15/2019   ALKPHOS 112 05/15/2019  AST 13 05/15/2019   ALT 9 05/15/2019   PROT 7.1 01/27/2021   ALBUMIN 4.0 05/15/2019   CALCIUM 9.1 01/19/2021   GFR 36.67 (L) 01/19/2021   Lab Results  Component Value Date   CHOL 148 10/18/2020   Lab Results  Component Value Date   HDL 42.00 10/18/2020   Lab Results  Component Value Date   LDLCALC 80 10/18/2020   Lab Results  Component Value Date   TRIG 128.0 10/18/2020   Lab Results  Component Value Date   CHOLHDL 4 10/18/2020   Lab Results  Component Value Date   HGBA1C 6.2 01/19/2021      Assessment & Plan:   Problem List Items Addressed This Visit       Cardiovascular and Mediastinum   Essential hypertension - Primary   Relevant Orders   CBC   Comprehensive metabolic panel   Urinalysis, Routine w reflex microscopic   Carotid disease, bilateral (HCC)   Relevant Orders   Comprehensive metabolic panel   Lipid panel     Nervous and Auditory   Neuropathy   Relevant Medications   gabapentin (NEURONTIN) 300 MG capsule     Other   Vitamin D deficiency   Relevant Orders   VITAMIN D 25 Hydroxy (Vit-D Deficiency, Fractures)   Vitamin B 12 deficiency   Relevant Orders   Vitamin B12   Other Visit Diagnoses     Hyperlipidemia, unspecified hyperlipidemia type       Relevant Orders   Comprehensive metabolic panel   Lipid panel       Meds ordered this encounter  Medications   gabapentin (NEURONTIN) 300 MG capsule    Sig: Take one capsule each morning and 2 in the evening prior to bedtime.    Dispense:  90 capsule    Refill:  3    Follow-up: Return in about 3 months (around 07/21/2021).  He will take 1 300 mg of Neurontin in  the morning and 2 at night before bed.  Libby Maw, MD

## 2021-04-21 NOTE — Addendum Note (Signed)
Addended by: Daron Offer A on: 04/21/2021 08:32 AM   Modules accepted: Orders

## 2021-04-21 NOTE — Addendum Note (Signed)
Addended by: Lynnea Ferrier on: 04/21/2021 03:01 PM   Modules accepted: Orders

## 2021-04-22 LAB — URINALYSIS, ROUTINE W REFLEX MICROSCOPIC
Bilirubin Urine: NEGATIVE
Glucose, UA: NEGATIVE
Hgb urine dipstick: NEGATIVE
Ketones, ur: NEGATIVE
Leukocytes,Ua: NEGATIVE
Nitrite: NEGATIVE
Protein, ur: NEGATIVE
Specific Gravity, Urine: 1.02 (ref 1.001–1.035)
pH: 5.5 (ref 5.0–8.0)

## 2021-04-24 ENCOUNTER — Telehealth: Payer: Medicare HMO

## 2021-04-24 NOTE — Progress Notes (Deleted)
Chronic Care Management Pharmacy Note  04/24/2021 Name:  [name] MRN:  [id number] DOB:  12-Apr-1928  Subjective: [name] is an 87 y.o. year old male who is a primary patient of Mliss Sax, MD.  The CCM team was consulted for assistance with disease management and care coordination needs.    Engaged with patient by telephone for follow up visit in response to provider referral for pharmacy case management and/or care coordination services.   Consent to Services:  The patient was given information about Chronic Care Management services, agreed to services, and gave verbal consent prior to initiation of services.  Please see initial visit note for detailed documentation.   Patient Care Team: Mliss Sax, MD as PCP - General (Family Medicine) Gaspar Cola, [facility] as Pharmacist (Pharmacist)  Recent office visits: 10/18/20: Patient presented to Dr. Doreene Burke for follow-up following fall. BP 138/70. Patient dehydrated. No medication changes made.   Recent consult visits: None in past 6 months.   Hospital visits: 10/17/20: Patient presented to ED following fall and minor head injury.   Objective:  Lab Results  Component Value Date   CREATININE 1.91 (H) 04/21/2021   BUN 44 (H) 04/21/2021   GFR 30.26 (L) 04/21/2021   GFRNONAA 45 (L) 01/27/2019   GFRAA 52 (L) 01/27/2019   NA 135 04/21/2021   K 4.4 04/21/2021   CALCIUM 9.2 04/21/2021   CO2 23 04/21/2021   GLUCOSE 98 04/21/2021    Lab Results  Component Value Date/Time   HGBA1C 6.2 01/19/2021 11:18 AM   GFR 30.26 (L) 04/21/2021 11:26 AM   GFR 36.67 (L) 01/19/2021 11:18 AM   MICROALBUR 1.2 12/10/2017 10:43 AM    Last diabetic Eye exam: No results found for: HMDIABEYEEXA  Last diabetic Foot exam: No results found for: HMDIABFOOTEX   Lab Results  Component Value Date   CHOL 136 04/21/2021   HDL 38.90 (L) 04/21/2021   LDLCALC 68 04/21/2021   LDLDIRECT 88.0 05/15/2019   TRIG 142.0  04/21/2021   CHOLHDL 3 04/21/2021    Hepatic Function Latest Ref Rng & Units 04/21/2021 01/27/2021 05/15/2019  Total Protein 6.0 - 8.3 g/dL 7.3 7.1 6.7  Albumin 3.5 - 5.2 g/dL 4.2 - 4.0  AST 0 - 37 U/L 13 - 13  ALT 0 - 53 U/L 10 - 9  Alk Phosphatase 39 - 117 U/L 89 - 112  Total Bilirubin 0.2 - 1.2 mg/dL 0.7 - 0.6    Lab Results  Component Value Date/Time   TSH 2.650 04/04/2018 03:55 PM   TSH 2.92 06/27/2016 12:00 AM   TSH 1.52 01/09/2007 11:49 AM    CBC Latest Ref Rng & Units 04/21/2021 10/18/2020 05/15/2019  WBC 4.0 - 10.5 K/uL 9.1 8.6 7.2  Hemoglobin 13.0 - 17.0 g/dL 05.8 66.9 13.1  Hematocrit 39.0 - 52.0 % 43.2 43.5 44.8  Platelets 150.0 - 400.0 K/uL 286.0 229.0 231.0    Lab Results  Component Value Date/Time   VD25OH 52.59 04/21/2021 11:26 AM   VD25OH 49.42 10/18/2020 11:07 AM    Clinical ASCVD: Yes  The ASCVD Risk score (Arnett DK, et al., 2019) failed to calculate for the following reasons:   The 2019 ASCVD risk score is only valid for ages 2 to 78    Depression screen PHQ 2/9 04/05/2021 01/19/2021 10/18/2020  Decreased Interest 0 0 0  Down, Depressed, Hopeless 0 0 0  PHQ - 2 Score 0 0 0  Altered sleeping 0 - -  Tired, decreased energy 0 - -  Change in appetite 0 - -  Feeling bad or failure about yourself  0 - -  Trouble concentrating 0 - -  Moving slowly or fidgety/restless 0 - -  Suicidal thoughts 0 - -  PHQ-9 Score 0 - -  Difficult doing work/chores Not difficult at all - -    Social History   Tobacco Use  Smoking Status Never  Smokeless Tobacco Never   BP Readings from Last 3 Encounters:  04/21/21 140/84  03/13/21 (!) 168/83  01/19/21 140/68   Pulse Readings from Last 3 Encounters:  04/21/21 71  03/13/21 64  01/19/21 (!) 56   Wt Readings from Last 3 Encounters:  04/21/21 181 lb 6.4 oz (82.3 kg)  03/13/21 182 lb (82.6 kg)  01/19/21 184 lb (83.5 kg)   BMI Readings from Last 3 Encounters:  04/21/21 26.03 kg/m  03/13/21 26.11 kg/m  01/19/21  26.40 kg/m    Assessment/Interventions: Review of patient past medical history, allergies, medications, health status, including review of consultants reports, laboratory and other test data, was performed as part of comprehensive evaluation and provision of chronic care management services.   SDOH:  (Social Determinants of Health) assessments and interventions performed: Yes   SDOH Screenings   Alcohol Screen: Not on file  Depression (PHQ2-9): Low Risk    PHQ-2 Score: 0  Financial Resource Strain: Low Risk    Difficulty of Paying Living Expenses: Not hard at all  Food Insecurity: Not on file  Housing: Not on file  Physical Activity: Not on file  Social Connections: Not on file  Stress: Not on file  Tobacco Use: Low Risk    Smoking Tobacco Use: Never   Smokeless Tobacco Use: Never  Transportation Needs: No Transportation Needs   Lack of Transportation (Medical): No   Lack of Transportation (Non-Medical): No    CCM Care Plan  Allergies  Allergen Reactions   Clonidine     Other reaction(s): Other (See Comments) Dry mouth and ineffective per patient.   Codeine Other (See Comments)    No reaction noted   Lisinopril     hyperkalemia   Penicillins     REACTION: rash    Medications Reviewed Today     Reviewed by Libby Maw, MD (Physician) on 04/21/21 at 1208  Med List Status: <None>   Medication Order Taking? Sig Documenting Provider Last Dose Status Informant  acetaminophen (TYLENOL) 500 MG tablet [id number] Yes Take 500 mg by mouth every 6 (six) hours as needed. [provider] Taking Active   amLODipine (NORVASC) 10 MG tablet [id number] Yes Take 1 tablet (10 mg total) by mouth every morning. Libby Maw, MD Taking Active   chlorthalidone (HYGROTON) 25 MG tablet [id number] Yes TAKE ONE TABLET BY MOUTH EVERY MORNING Libby Maw, MD Taking Active   Cholecalciferol (VITAMIN D3) 2000 units TABS [id number] Yes Take 1 tablet by mouth  daily. [provider] Taking Active   clopidogrel (PLAVIX) 75 MG tablet [id number] Yes Take 1 tablet (75 mg total) by mouth every morning. Libby Maw, MD Taking Active   docusate sodium (COLACE) 100 MG capsule [id number] Yes Take 1 capsule (100 mg total) by mouth 2 (two) times daily. Libby Maw, MD Taking Active   gabapentin (NEURONTIN) 300 MG capsule [id number] Yes Take one capsule each morning and 2 in the evening prior to bedtime. Libby Maw, MD  Active   simvastatin (ZOCOR) 10 MG tablet [id number] Yes TAKE ONE  TABLET BY MOUTH EVERYDAY AT BEDTIME Libby Maw, MD Taking Active   spironolactone (ALDACTONE) 25 MG tablet [id number] Yes TAKE ONE TABLET BY MOUTH EVERY MORNING Libby Maw, MD Taking Active   vitamin B-12 (CYANOCOBALAMIN) 1000 MCG tablet [id number] Yes Take 1,000 mcg by mouth daily. [provider] Taking Active   vitamin C (ASCORBIC ACID) 500 MG tablet [id number] Yes Take 500 mg by mouth daily. [provider] Taking Active             Patient Active Problem List   Diagnosis Date Noted   Elevated glucose 01/19/2021   Vitamin B 12 deficiency 01/19/2021   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   Fall 10/18/2020   Fibrotic lung diseases ([place]) 10/18/2020   Slow transit constipation 04/05/2020   Near syncope 12/24/2019   Abnormal CT of the chest 05/22/2019   Vitamin D deficiency 05/22/2019   Edema 01/27/2019   Bilateral rales 01/27/2019   Weight gain 01/01/2019   Elevated cholesterol 05/19/2018   Stage 3a chronic kidney disease ([place]) 04/17/2018   Hyperkalemia 04/04/2018   Bradycardia 03/17/2018   PND (post-nasal drip) 12/10/2017   Neuropathy 12/05/2017   Transient cerebral ischemia 10/06/2007   Carotid disease, bilateral ([place]) 01/27/2007   PAIN IN JOINT, MULTIPLE SITES 01/09/2007   BRANCH RETINAL VEIN OCCLUSION 11/19/2006   Hearing loss 11/19/2006   Essential hypertension 11/19/2006   PEPTIC ULCER DISEASE  11/19/2006   HEADACHE 11/19/2006    Immunization History  Administered Date(s) Administered   Fluad Quad(high Dose 65+) 04/15/2019   Influenza Whole 06/19/2007   Influenza, High Dose Seasonal PF 05/02/2018   Influenza-Unspecified 05/13/2017, 05/28/2020   Moderna Sars-Covid-2 Vaccination 09/30/2019, 10/28/2019, 05/28/2020   Pneumococcal Conjugate-13 07/31/2015   Pneumococcal Polysaccharide-23 07/31/2015, 10/24/2016   Zoster Recombinat (Shingrix) 04/26/2020, 07/29/2020    Conditions to be addressed/monitored:  Hypertension, Hyperlipidemia, Chronic Kidney Disease and History of TIA   There are no care plans that you recently modified to display for this patient.    Medication Assistance: None required.  Patient affirms current coverage meets needs.  Patient's preferred pharmacy is:  [facility] - [place], [place] - [address] [place] [address] Phone: [phone] Fax: [phone]  Patient decided to: Utilize [facility] for medication synchronization, packaging and delivery   Care Plan and Follow Up Patient Decision:  Patient agrees to Care Plan and Follow-up.  Plan: Telephone follow up appointment with care management team member scheduled for:  04/24/2021 at 11:00 AM  [name], PharmD, CPP Clinical Pharmacist [facility]  [phone]  Current Barriers:  No Barriers Noted  Pharmacist Clinical Goal(s):  Patient will maintain control of blood pressure as evidenced by BP less than 140/90  through collaboration with PharmD and provider.   Interventions: 1:1 collaboration with Libby Maw, MD regarding development and update of comprehensive plan of care as evidenced by provider attestation and co-signature Inter-disciplinary care team collaboration (see longitudinal plan of care) Comprehensive medication review performed; medication list updated in electronic  medical record  Hypertension (BP goal <140/90) -Controlled -Current treatment: Amlodipine 10 mg daily  Chlorthalidone 25 mg daily  Spironolactone 25 mg daily  -Medications previously tried: NA  -Current home readings: 283-151V systolic -Current dietary habits: Tries to minimize salt intake -Current exercise habits: Tries to walk  -Denies hypotensive/hypertensive symptoms -Educated on Daily salt intake goal < 2300 mg; Exercise goal of 150 minutes per week; -Counseled to monitor BP at home weekly,  document, and provide log at future appointments -Recommended to continue current medication  -Recommended to increase hydration to 50-60 oz daily   Hyperlipidemia: (LDL goal < 100) -Controlled -Current treatment: Simvastatin 10 mg daily  -Current antiplatelet treatment: Clopidogrel 75 mg daily   -Medications previously tried: NA  -Educated on Importance of limiting foods high in cholesterol; -Recommended to continue current medication   Patient Goals/Self-Care Activities Patient will:  - check blood pressure weekly, document, and provide at future appointments increase water intake (goal 60 oz daily)  Follow Up Plan: ***

## 2021-05-16 ENCOUNTER — Telehealth: Payer: Self-pay

## 2021-05-16 NOTE — Progress Notes (Signed)
Chronic Care Management Pharmacy Assistant   Name: [name]  MRN: [id number] DOB: 18-Dec-1927  Reason for Encounter: Medication Review/Medication Coordination Call.   Recent office visits:  04/21/2021 Dr. Ethelene Hal MD (PCP) Change Gabapentin 300 mg 1 tablet in the morning and 2 at bedtime, Return in about 3 months   Recent consult visits:  NO recent [place] [facility] visits:  None in previous 6 months  Medications: Outpatient Encounter Medications as of 05/16/2021  Medication Sig   acetaminophen (TYLENOL) 500 MG tablet Take 500 mg by mouth every 6 (six) hours as needed.   amLODipine (NORVASC) 10 MG tablet Take 1 tablet (10 mg total) by mouth every morning.   chlorthalidone (HYGROTON) 25 MG tablet TAKE ONE TABLET BY MOUTH EVERY MORNING   Cholecalciferol (VITAMIN D3) 2000 units TABS Take 1 tablet by mouth daily.   clopidogrel (PLAVIX) 75 MG tablet Take 1 tablet (75 mg total) by mouth every morning.   docusate sodium (COLACE) 100 MG capsule Take 1 capsule (100 mg total) by mouth 2 (two) times daily.   gabapentin (NEURONTIN) 300 MG capsule Take one capsule each morning and 2 in the evening prior to bedtime.   simvastatin (ZOCOR) 10 MG tablet TAKE ONE TABLET BY MOUTH EVERYDAY AT BEDTIME   spironolactone (ALDACTONE) 25 MG tablet TAKE ONE TABLET BY MOUTH EVERY MORNING   vitamin B-12 (CYANOCOBALAMIN) 1000 MCG tablet Take 1,000 mcg by mouth daily.   vitamin C (ASCORBIC ACID) 500 MG tablet Take 500 mg by mouth daily.   No facility-administered encounter medications on file as of 05/16/2021.    Care Gaps: Influenza Vaccine HTN: 140/84 on 04/21/2021 [facility]: Simvastatin 25 mg last filled on 04/24/2021 for 30 day supply at [facility] Medication Fill Gaps: None  Reviewed chart for medication changes ahead of medication coordination call.   BP Readings from Last 3 Encounters:  04/21/21 140/84  03/13/21 (!) 168/83  01/19/21 140/68    Lab Results   Component Value Date   HGBA1C 6.2 01/19/2021     Patient obtains medications through Adherence Packaging  30 Days   Last adherence delivery included:  Clopidogrel 75 mg daily - breakfast Gabapentin 300 mg one tablet three times daily - breakfast, evening meal, bedtime Simvastatin 10 mg daily - bedtime Chlorthalidone 25 mg daily - breakfast Vitamin D3 2000 units daily - breakfast Vitamin C 500 mg daily - breakfast Amlodipine 10 mg daily - breakfast  Spironolactone 25 mg daily-breakfast Docusate sodium 100 mg twice daily- breakfast, evening meal  Vitamin B12 1000 MG daily -Breakfast  Patient declined medications  last month  Acetaminophen 500 mg PRN Saline Nose Solution filled on 01/17/2021 for 30 day supply Retaine Eye Drops 0.5% filled on 01/17/2021 for 30 day supply  Patient is due for next adherence delivery on: 05/29/2021. Called patient and reviewed medications and coordinated delivery.  This delivery to include: Clopidogrel 75 mg daily - breakfast Gabapentin 300 mg Take one capsule each morning and 2 in the evening prior to bedtime. Simvastatin 10 mg daily - bedtime Chlorthalidone 25 mg daily - breakfast Vitamin D3 2000 units daily - breakfast Vitamin C 500 mg daily - breakfast Amlodipine 10 mg daily - breakfast  Spironolactone 25 mg daily-breakfast Docusate sodium 100 mg twice daily- breakfast, evening meal  Vitamin B12 1000 MG daily -Breakfast Acetaminophen 500 mg PRN Retaine Eye Drops 0.5%  Patient declined the following medications  Saline Nose Solution filled on 01/17/2021 for 30 day supply  Patient  needs refills for Simvastatin .  Confirmed delivery date of 05/29/2021, advised patient that pharmacy will contact them the morning of delivery.  [place] Pharmacist Assistant [phone]

## 2021-05-22 ENCOUNTER — Other Ambulatory Visit: Payer: Self-pay | Admitting: Family Medicine

## 2021-05-22 DIAGNOSIS — E7800 Pure hypercholesterolemia, unspecified: Secondary | ICD-10-CM

## 2021-05-22 DIAGNOSIS — I6523 Occlusion and stenosis of bilateral carotid arteries: Secondary | ICD-10-CM

## 2021-05-25 ENCOUNTER — Other Ambulatory Visit: Payer: Self-pay

## 2021-05-25 ENCOUNTER — Encounter (HOSPITAL_BASED_OUTPATIENT_CLINIC_OR_DEPARTMENT_OTHER): Payer: Self-pay | Admitting: *Deleted

## 2021-05-25 ENCOUNTER — Emergency Department (HOSPITAL_BASED_OUTPATIENT_CLINIC_OR_DEPARTMENT_OTHER)
Admission: EM | Admit: 2021-05-25 | Discharge: 2021-05-25 | Disposition: A | Discharge status: Home / Self Care | Payer: Medicare HMO | Attending: Emergency Medicine | Admitting: Emergency Medicine

## 2021-05-25 DIAGNOSIS — N1831 Chronic kidney disease, stage 3a: Secondary | ICD-10-CM | POA: Diagnosis not present

## 2021-05-25 DIAGNOSIS — H60502 Unspecified acute noninfective otitis externa, left ear: Secondary | ICD-10-CM | POA: Diagnosis not present

## 2021-05-25 DIAGNOSIS — Z79899 Other long term (current) drug therapy: Secondary | ICD-10-CM | POA: Insufficient documentation

## 2021-05-25 DIAGNOSIS — I129 Hypertensive chronic kidney disease with stage 1 through stage 4 chronic kidney disease, or unspecified chronic kidney disease: Secondary | ICD-10-CM | POA: Insufficient documentation

## 2021-05-25 DIAGNOSIS — H9202 Otalgia, left ear: Secondary | ICD-10-CM | POA: Diagnosis present

## 2021-05-25 DIAGNOSIS — H6092 Unspecified otitis externa, left ear: Secondary | ICD-10-CM | POA: Insufficient documentation

## 2021-05-25 DIAGNOSIS — Z7902 Long term (current) use of antithrombotics/antiplatelets: Secondary | ICD-10-CM | POA: Diagnosis not present

## 2021-05-25 MED ORDER — CEFDINIR 300 MG PO CAPS
300.0000 mg | ORAL_CAPSULE | Freq: Two times a day (BID) | ORAL | Status: DC
  Administered 2021-05-25: 300 mg via ORAL
  Filled 2021-05-25: qty 1

## 2021-05-25 MED ORDER — CIPROFLOXACIN-DEXAMETHASONE 0.3-0.1 % OT SUSP
4.0000 [drp] | Freq: Once | OTIC | Status: AC
  Administered 2021-05-25: 4 [drp] via OTIC
  Filled 2021-05-25: qty 7.5

## 2021-05-25 MED ORDER — CEFDINIR 300 MG PO CAPS: 300.0000 mg | ORAL_CAPSULE | Freq: Two times a day (BID) | ORAL | 0 refills | Status: AC

## 2021-05-25 MED ORDER — DOXYCYCLINE HYCLATE 100 MG PO TABS: 100.0000 mg | ORAL_TABLET | Freq: Once | ORAL | Status: DC

## 2021-05-25 NOTE — ED Triage Notes (Signed)
Drainage from his left ear since last night.

## 2021-05-25 NOTE — Discharge Instructions (Signed)
You have a ear infection.  Please take Omnicef twice daily for a week  Use Ciprodex 3 times daily for a week as well  Please follow-up with your ENT doctor for further evaluation.  Return to ER if you have worse ear pain, purulent discharge, fever.

## 2021-05-25 NOTE — ED Provider Notes (Signed)
[name] Provider Note   CSN: [id number] Arrival date & time: 05/25/21  1815     History Chief Complaint  Patient presents with   Ear Fullness    [name] is a 87 y.o. male here presenting with left ear pain.  Patient states that he is congested and had an episode of cough and felt that there was a pop in the left ear.  Patient then had some purulent discharge for several days.  He told his daughter today and was brought here for further evaluation.  Patient has hearing aids on both sides.  Denies any fever or chills  The history is provided by the patient.      Past Medical History:  Diagnosis Date   Branch retinal vein occlusion of left eye    High cholesterol    Hypertension    TIA (transient ischemic attack)     Patient Active Problem List   Diagnosis Date Noted   Elevated glucose 01/19/2021   Vitamin B 12 deficiency 01/19/2021   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   Fall 10/18/2020   Fibrotic lung diseases ([place]) 10/18/2020   Slow transit constipation 04/05/2020   Near syncope 12/24/2019   Abnormal CT of the chest 05/22/2019   Vitamin D deficiency 05/22/2019   Edema 01/27/2019   Bilateral rales 01/27/2019   Weight gain 01/01/2019   Elevated cholesterol 05/19/2018   Stage 3a chronic kidney disease ([place]) 04/17/2018   Hyperkalemia 04/04/2018   Bradycardia 03/17/2018   PND (post-nasal drip) 12/10/2017   Neuropathy 12/05/2017   Transient cerebral ischemia 10/06/2007   Carotid disease, bilateral ([place]) 01/27/2007   PAIN IN JOINT, MULTIPLE SITES 01/09/2007   BRANCH RETINAL VEIN OCCLUSION 11/19/2006   Hearing loss 11/19/2006   Essential hypertension 11/19/2006   PEPTIC ULCER DISEASE 11/19/2006   HEADACHE 11/19/2006    Past Surgical History:  Procedure Laterality Date   CAROTID ENDARTERECTOMY Right    HERNIA REPAIR     INNER EAR SURGERY Right    for hearing loss       Family History  Problem Relation Age of Onset   Lung  cancer Father     Social History   Tobacco Use   Smoking status: Never   Smokeless tobacco: Never  Vaping Use   Vaping Use: Never used  Substance Use Topics   Alcohol use: Yes    Comment: 1 glass wine dialy   Drug use: Never    Home Medications Prior to Admission medications   Medication Sig Start Date End Date Taking? Authorizing Provider  acetaminophen (TYLENOL) 500 MG tablet Take 500 mg by mouth every 6 (six) hours as needed.    [provider]  amLODipine (NORVASC) 10 MG tablet Take 1 tablet (10 mg total) by mouth every morning. 04/21/21   Melvin Maw, MD  Carboxymethylcellulose Sod PF (RETAINE [facility]) 0.5 % SOLN Place 1 drop into both eyes as needed (Dry Eyes).    [provider]  chlorthalidone (HYGROTON) 25 MG tablet TAKE ONE TABLET BY MOUTH EVERY MORNING 01/27/21   Melvin Maw, MD  Cholecalciferol (VITAMIN D3) 2000 units TABS Take 1 tablet by mouth daily.    [provider]  clopidogrel (PLAVIX) 75 MG tablet Take 1 tablet (75 mg total) by mouth every morning. 04/21/21   Melvin Maw, MD  docusate sodium (COLACE) 100 MG capsule Take 1 capsule (100 mg total) by mouth 2 (two) times daily. 04/05/20   Melvin Maw, MD  gabapentin (  NEURONTIN) 300 MG capsule Take one capsule each morning and 2 in the evening prior to bedtime. 04/21/21   Melvin Maw, MD  simvastatin (ZOCOR) 10 MG tablet TAKE ONE TABLET BY MOUTH EVERYDAY AT BEDTIME 05/22/21   Melvin Maw, MD  spironolactone (ALDACTONE) 25 MG tablet TAKE ONE TABLET BY MOUTH EVERY MORNING 01/27/21   Melvin Maw, MD  vitamin B-12 (CYANOCOBALAMIN) 1000 MCG tablet Take 1,000 mcg by mouth daily.    [provider]  vitamin C (ASCORBIC ACID) 500 MG tablet Take 500 mg by mouth daily.    [provider]    Allergies    Clonidine, Codeine, Lisinopril, and Penicillins  Review of Systems   Review of Systems  HENT:  Positive for ear  discharge and ear pain.   All other systems reviewed and are negative.  Physical Exam Updated Vital Signs BP (!) 146/86 (BP Location: Right Arm)   Pulse 82   Temp 97.7 F (36.5 C) (Oral)   Resp 20   Ht 5\' 10"  (1.778 m)   Wt 82.3 kg   SpO2 97%   BMI 26.03 kg/m   Physical Exam Vitals and nursing note reviewed.  Constitutional:      Comments: Chronically ill  HENT:     Head: Normocephalic.     Ears:     Comments: Mild otitis externa left ear canal.  The left TM is swollen and there appears to be purulent discharge around the eardrum.  There is no obvious hole to suggest a perforated eardrum.  Right TM is opacified but no obvious otitis media    Nose: Nose normal.     Mouth/Throat:     Mouth: Mucous membranes are moist.  Eyes:     Extraocular Movements: Extraocular movements intact.     Pupils: Pupils are equal, round, and reactive to light.  Cardiovascular:     Rate and Rhythm: Normal rate and regular rhythm.     Pulses: Normal pulses.  Pulmonary:     Effort: Pulmonary effort is normal.     Breath sounds: Normal breath sounds.  Abdominal:     General: Abdomen is flat.     Palpations: Abdomen is soft.  Musculoskeletal:        General: Normal range of motion.     Cervical back: Normal range of motion and neck supple.  Skin:    General: Skin is warm.     Capillary Refill: Capillary refill takes less than 2 seconds.  Neurological:     General: No focal deficit present.     Mental Status: He is alert.  Psychiatric:        Mood and Affect: Mood normal.        Behavior: Behavior normal.    ED Results / Procedures / Treatments   Labs (all labs ordered are listed, but only abnormal results are displayed) Labs Reviewed - No data to display  EKG None  Radiology No results found.  Procedures Procedures   Medications Ordered in ED Medications  ciprofloxacin-dexamethasone (CIPRODEX) 0.3-0.1 % OTIC (EAR) suspension 4 drop (has no administration in time range)   cefdinir (OMNICEF) capsule 300 mg (has no administration in time range)    ED Course  I have reviewed the triage vital signs and the nursing notes.  Pertinent labs & imaging results that were available during my care of the patient were reviewed by me and considered in my medical decision making (see chart for details).    MDM Rules/Calculators/A&P  [name] is a 87 y.o. male here presenting with left ear pain and discharge.  Patient does have bulging of the left TM and some discharge around the left TM.  Also some mild left otitis external as well.  Unclear if he has a small perforation that I cannot see.  We will try Ciprodex as well as Omnicef.  Patient has penicillin allergy and has rash better.  Stable for discharge.  Final Clinical Impression(s) / ED Diagnoses Final diagnoses:  None    Rx / DC Orders ED Discharge Orders     None        Drenda Freeze, MD 05/25/21 2056

## 2021-05-30 DIAGNOSIS — H6092 Unspecified otitis externa, left ear: Secondary | ICD-10-CM | POA: Diagnosis not present

## 2021-06-15 ENCOUNTER — Telehealth: Payer: Self-pay

## 2021-06-15 NOTE — Progress Notes (Signed)
Chronic Care Management Pharmacy Assistant   Name: [name]  MRN: [id number] DOB: [birth year]  Reason for Encounter: Medication Review?Medication Coordination Call.   Recent office visits:  No recent Office Visit  Recent consult visits:  05/30/2021 Pietro Cassis PA-C (Otolaryngology) No Medication Changes noted.  Hospital visits:  Medication Reconciliation was completed by comparing discharge summary, patient's EMR and Pharmacy list, and upon discussion with patient.  Admitted to the hospital on 05/25/2021 due to Acute otitis externa of left ear, unspecified type. Discharge date was 05/25/2021. Discharged from [facility]?Medications Started at [facility] Discharge:?? -started Cefdinir 300 mg   Medication Changes at Hospital Discharge: -Changed None  Medications Discontinued at Hospital Discharge: -Stopped None  Medications that remain the same after Hospital Discharge:??  -All other medications will remain the same.    Medications: Outpatient Encounter Medications as of 06/15/2021  Medication Sig   acetaminophen (TYLENOL) 500 MG tablet Take 500 mg by mouth every 6 (six) hours as needed.   amLODipine (NORVASC) 10 MG tablet Take 1 tablet (10 mg total) by mouth every morning.   Carboxymethylcellulose Sod PF (RETAINE CMC) 0.5 % SOLN Place 1 drop into both eyes as needed (Dry Eyes).   chlorthalidone (HYGROTON) 25 MG tablet TAKE ONE TABLET BY MOUTH EVERY MORNING   Cholecalciferol (VITAMIN D3) 2000 units TABS Take 1 tablet by mouth daily.   clopidogrel (PLAVIX) 75 MG tablet Take 1 tablet (75 mg total) by mouth every morning.   docusate sodium (COLACE) 100 MG capsule Take 1 capsule (100 mg total) by mouth 2 (two) times daily.   gabapentin (NEURONTIN) 300 MG capsule Take one capsule each morning and 2 in the evening prior to bedtime.   simvastatin (ZOCOR) 10 MG tablet TAKE ONE TABLET BY MOUTH EVERYDAY AT BEDTIME    spironolactone (ALDACTONE) 25 MG tablet TAKE ONE TABLET BY MOUTH EVERY MORNING   vitamin B-12 (CYANOCOBALAMIN) 1000 MCG tablet Take 1,000 mcg by mouth daily.   vitamin C (ASCORBIC ACID) 500 MG tablet Take 500 mg by mouth daily.   No facility-administered encounter medications on file as of 06/15/2021.    Care Gaps: Influenza Vaccine Pneumonia Vaccine HTN: 140/84 on 04/21/2021 Star Rating Drugs: Simvastatin 25 mg last filled on 05/22/2021 for 30 day supply at [facility] Medication Fill Gaps: None  Reviewed chart for medication changes ahead of medication coordination call.  BP Readings from Last 3 Encounters:  05/25/21 (!) 146/86  04/21/21 140/84  03/13/21 (!) 168/83    Lab Results  Component Value Date   HGBA1C 6.2 01/19/2021     Patient obtains medications through Adherence Packaging  30 Days   Last adherence delivery included:  Clopidogrel 75 mg daily - breakfast Gabapentin 300 mg Take one capsule each morning and 2 in the evening prior to bedtime. Simvastatin 10 mg daily - bedtime Chlorthalidone 25 mg daily - breakfast Vitamin D3 2000 units daily - breakfast Vitamin C 500 mg daily - breakfast Amlodipine 10 mg daily - breakfast  Spironolactone 25 mg daily-breakfast Docusate sodium 100 mg twice daily- breakfast, evening meal  Vitamin B12 1000 MG daily -Breakfast Acetaminophen 500 mg PRN Retaine Eye Drops 0.5%  Patient declined medications last month  Saline Nose Solution filled on 01/17/2021 for 30 day supply  Patient is due for next adherence delivery on: 06/27/2021. Called patient and reviewed medications and coordinated delivery.  This delivery to include: Clopidogrel 75 mg daily - breakfast Gabapentin 300 mg  Take one capsule each morning and 2 in the evening prior to bedtime. Simvastatin 10 mg daily - bedtime Chlorthalidone 25 mg daily - breakfast Vitamin D3 2000 units daily - breakfast Vitamin C 500 mg daily - breakfast Amlodipine 10 mg daily -  breakfast  Spironolactone 25 mg daily-breakfast Docusate sodium 100 mg twice daily- breakfast, evening meal  Vitamin B12 1000 MG daily -Breakfast  Patient declined the following medications: Retaine Eye Drops 0.5% (Adequate supply) Acetaminophen 500 mg PRN Saline Nose Solution filled on 01/17/2021 for 30 day supply  Patient needs refills for None ID.  Confirmed delivery date of 06/27/2021 ([facility]), advised patient that pharmacy will contact them the morning of delivery.  [place] Pharmacist Assistant [phone]

## 2021-07-17 ENCOUNTER — Telehealth: Payer: Self-pay | Admitting: Family Medicine

## 2021-07-17 ENCOUNTER — Telehealth: Payer: Self-pay

## 2021-07-17 DIAGNOSIS — I10 Essential (primary) hypertension: Secondary | ICD-10-CM

## 2021-07-17 MED ORDER — SPIRONOLACTONE 25 MG PO TABS: 25.0000 mg | ORAL_TABLET | Freq: Every morning | ORAL | 4 refills | Status: DC

## 2021-07-17 MED ORDER — CHLORTHALIDONE 25 MG PO TABS: 25.0000 mg | ORAL_TABLET | Freq: Every morning | ORAL | 4 refills | Status: DC

## 2021-07-17 NOTE — Progress Notes (Signed)
I reach out to patient PCP office to request refills for Chlorthalidone,Spironolactone on 07/17/2021 [facility].   [place] Pharmacist Assistant [phone]

## 2021-07-17 NOTE — Progress Notes (Addendum)
Chronic Care Management Pharmacy Assistant   Name: HATIM HOMANN  MRN: [id number] DOB: 01/23/1928  Reason for Encounter: Medication Review/Medication coordination call.   Recent office visits:  No recent office visit  Recent consult visits:  No recent consult visit  Hospital visits:  None in previous 6 months  Medications: Outpatient Encounter Medications as of 07/17/2021  Medication Sig   acetaminophen (TYLENOL) 500 MG tablet Take 500 mg by mouth every 6 (six) hours as needed.   amLODipine (NORVASC) 10 MG tablet Take 1 tablet (10 mg total) by mouth every morning.   Carboxymethylcellulose Sod PF (RETAINE CMC) 0.5 % SOLN Place 1 drop into both eyes as needed (Dry Eyes).   chlorthalidone (HYGROTON) 25 MG tablet TAKE ONE TABLET BY MOUTH EVERY MORNING   Cholecalciferol (VITAMIN D3) 2000 units TABS Take 1 tablet by mouth daily.   clopidogrel (PLAVIX) 75 MG tablet Take 1 tablet (75 mg total) by mouth every morning.   docusate sodium (COLACE) 100 MG capsule Take 1 capsule (100 mg total) by mouth 2 (two) times daily.   gabapentin (NEURONTIN) 300 MG capsule Take one capsule each morning and 2 in the evening prior to bedtime.   simvastatin (ZOCOR) 10 MG tablet TAKE ONE TABLET BY MOUTH EVERYDAY AT BEDTIME   spironolactone (ALDACTONE) 25 MG tablet TAKE ONE TABLET BY MOUTH EVERY MORNING   vitamin B-12 (CYANOCOBALAMIN) 1000 MCG tablet Take 1,000 mcg by mouth daily.   vitamin C (ASCORBIC ACID) 500 MG tablet Take 500 mg by mouth daily.   No facility-administered encounter medications on file as of 07/17/2021.    Care Gaps: Influenza Vaccine HTN: 140/84 on 04/21/2021 Star Rating Drugs: Simvastatin 25 mg last filled on 05/22/2021 for 30 day supply at [facility] Medication Fill Gaps: None  Reviewed chart for medication changes ahead of medication coordination call.  BP Readings from Last 3 Encounters:  05/25/21 (!) 146/86  04/21/21 140/84  03/13/21 (!) 168/83    Lab  Results  Component Value Date   HGBA1C 6.2 01/19/2021     Patient obtains medications through Adherence Packaging  30 Days   Last adherence delivery included:  Clopidogrel 75 mg daily - breakfast Gabapentin 300 mg Take one capsule each morning and 2 in the evening prior to bedtime. Simvastatin 10 mg daily - bedtime Chlorthalidone 25 mg daily - breakfast Vitamin D3 2000 units daily - breakfast Vitamin C 500 mg daily - breakfast Amlodipine 10 mg daily - breakfast  Spironolactone 25 mg daily-breakfast Docusate sodium 100 mg twice daily- breakfast, evening meal  Vitamin B12 1000 MG daily -Breakfast  Patient declined medications last month  Retaine Eye Drops 0.5% (Adequate supply) Acetaminophen 500 mg PRN Saline Nose Solution filled on 01/17/2021 for 30 day supply  Patient is due for next adherence delivery on: 07/28/2021. Called patient and reviewed medications and coordinated delivery.  This delivery to include: Clopidogrel 75 mg daily - breakfast Gabapentin 300 mg Take one capsule each morning and 2 in the evening prior to bedtime. Simvastatin 10 mg daily - bedtime Chlorthalidone 25 mg daily - breakfast Vitamin D3 2000 units daily - breakfast Vitamin C 500 mg daily - breakfast Amlodipine 10 mg daily - breakfast  Spironolactone 25 mg daily-breakfast Docusate sodium 100 mg twice daily- breakfast, evening meal  Vitamin B12 1000 MG daily -Breakfast  Patient declined the following medications: Retaine Eye Drops 0.5% (Adequate supply) Acetaminophen 500 mg PRN Saline Nose Solution filled on 01/17/2021 for 30 day supply  Patient needs refills  for Chlorthalidone,Spironolactone .  Confirmed delivery date of 07/28/2021 (second route), advised patient that pharmacy will contact them the morning of delivery.  [place] Pharmacist Assistant [phone]   8 minutes spent in review, coordination, and documentation.  Reviewed by: Beverly Milch,  PharmD Clinical Pharmacist [phone]

## 2021-07-17 NOTE — Telephone Encounter (Signed)
[facility] called for prescriptions:  chlorthalidone (HYGROTON) 25 MG tablet spironolactone (ALDACTONE) 25 MG tablet   Last OV: 04/21/21

## 2021-07-20 ENCOUNTER — Ambulatory Visit: Payer: Medicare HMO | Admitting: Family Medicine

## 2021-08-01 ENCOUNTER — Other Ambulatory Visit: Payer: Self-pay

## 2021-08-01 ENCOUNTER — Encounter: Payer: Self-pay | Admitting: Family Medicine

## 2021-08-01 ENCOUNTER — Ambulatory Visit (INDEPENDENT_AMBULATORY_CARE_PROVIDER_SITE_OTHER): Payer: Medicare HMO | Admitting: Family Medicine

## 2021-08-01 VITALS — BP 164/76 | HR 64 | Temp 96.9°F | Ht 70.0 in | Wt 182.8 lb

## 2021-08-01 DIAGNOSIS — N1831 Chronic kidney disease, stage 3a: Secondary | ICD-10-CM

## 2021-08-01 DIAGNOSIS — N1832 Chronic kidney disease, stage 3b: Secondary | ICD-10-CM | POA: Insufficient documentation

## 2021-08-01 DIAGNOSIS — I10 Essential (primary) hypertension: Secondary | ICD-10-CM

## 2021-08-01 MED ORDER — CHLORTHALIDONE 25 MG PO TABS: 25.0000 mg | ORAL_TABLET | Freq: Every morning | ORAL | 0 refills | Status: DC

## 2021-08-01 MED ORDER — LOSARTAN POTASSIUM 50 MG PO TABS: 50.0000 mg | ORAL_TABLET | Freq: Every day | ORAL | 1 refills | Status: DC

## 2021-08-01 MED ORDER — CHLORTHALIDONE 25 MG PO TABS: 25.0000 mg | ORAL_TABLET | Freq: Every morning | ORAL | 4 refills | Status: DC

## 2021-08-01 NOTE — Progress Notes (Addendum)
Established Patient Office Visit  Subjective:  Patient ID: [name], male    DOB: 06-13-1928  Age: 88 y.o. MRN: [id number]  CC:  Chief Complaint  Patient presents with   Follow-up    3 month follow, no concerns.     HPI [name] presents for follow-up of hypertension, CKD and elevated glucose.  Last hemoglobin A1c was 6.2.  Blood pressure has been running higher at home.  He has been asymptomatic without headaches blurred vision dizziness.  He has been doing his best to hydrate.  Lisinopril had led to hyperkalemia.  He feels well.  He sleeps 9 hours nightly.  He is still involved with projects around his house such as improving the safety of his living space.  Past Medical History:  Diagnosis Date   Branch retinal vein occlusion of left eye    High cholesterol    Hypertension    TIA (transient ischemic attack)     Past Surgical History:  Procedure Laterality Date   CAROTID ENDARTERECTOMY Right    HERNIA REPAIR     INNER EAR SURGERY Right    for hearing loss    Family History  Problem Relation Age of Onset   Lung cancer Father     Social History   Socioeconomic History   Marital status: Widowed    Spouse name: Not on file   Number of children: 4   Years of education: Not on file   Highest education level: Not on file  Occupational History    Comment: retired, 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  Tobacco Use   Smoking status: Never   Smokeless tobacco: Never  Vaping Use   Vaping Use: Never used  Substance and Sexual Activity   Alcohol use: Yes    Comment: 1 glass wine dialy   Drug use: Never   Sexual activity: Not Currently  Other Topics Concern   Not on file  Social History Narrative   Lives alone   Social Determinants of Health   Financial Resource Strain: Not on file  Food Insecurity: Not on file  Transportation Needs: No Transportation Needs   Lack of Transportation (Medical): No   Lack of Transportation (Non-Medical): No  Physical  Activity: Not on file  Stress: Not on file  Social Connections: Not on file  Intimate Partner Violence: Not on file    Outpatient Medications Prior to Visit  Medication Sig Dispense Refill   acetaminophen (TYLENOL) 500 MG tablet Take 500 mg by mouth every 6 (six) hours as needed.     amLODipine (NORVASC) 10 MG tablet Take 1 tablet (10 mg total) by mouth every morning. 90 tablet 1   Carboxymethylcellulose Sod PF 0.5 % SOLN Place 1 drop into both eyes as needed (Dry Eyes).     Cholecalciferol (VITAMIN D3) 2000 units TABS Take 1 tablet by mouth daily.     clopidogrel (PLAVIX) 75 MG tablet Take 1 tablet (75 mg total) by mouth every morning. 90 tablet 1   docusate sodium (COLACE) 100 MG capsule Take 1 capsule (100 mg total) by mouth 2 (two) times daily. 100 capsule 0   gabapentin (NEURONTIN) 300 MG capsule Take one capsule each morning and 2 in the evening prior to bedtime. 90 capsule 3   simvastatin (ZOCOR) 10 MG tablet TAKE ONE TABLET BY MOUTH EVERYDAY AT BEDTIME 90 tablet 1   vitamin B-12 (CYANOCOBALAMIN) 1000 MCG tablet Take 1,000 mcg by mouth daily.     vitamin C (ASCORBIC ACID) 500  MG tablet Take 500 mg by mouth daily.     chlorthalidone (HYGROTON) 25 MG tablet Take 1 tablet (25 mg total) by mouth every morning. 30 tablet 4   spironolactone (ALDACTONE) 25 MG tablet Take 1 tablet (25 mg total) by mouth every morning. 30 tablet 4   No facility-administered medications prior to visit.    Allergies  Allergen Reactions   Clonidine     Other reaction(s): Other (See Comments) Dry mouth and ineffective per patient.   Codeine Other (See Comments)    No reaction noted   Lisinopril     hyperkalemia   Penicillins     REACTION: rash    ROS Review of Systems  Constitutional:  Negative for chills, diaphoresis, fatigue, fever and unexpected weight change.  HENT: Negative.    Eyes:  Negative for photophobia and visual disturbance.  Respiratory: Negative.    Cardiovascular: Negative.    Gastrointestinal: Negative.   Endocrine: Negative for polyphagia and polyuria.  Genitourinary:  Negative for decreased urine volume, difficulty urinating, frequency and hematuria.  Neurological:  Negative for dizziness, light-headedness and headaches.  Psychiatric/Behavioral: Negative.       Objective:    Physical Exam Vitals and nursing note reviewed.  Constitutional:      General: He is not in acute distress.    Appearance: Normal appearance. He is not ill-appearing, toxic-appearing or diaphoretic.  HENT:     Head: Normocephalic and atraumatic.     Right Ear: External ear normal.     Left Ear: External ear normal.  Eyes:     Extraocular Movements: Extraocular movements intact.     Pupils: Pupils are equal, round, and reactive to light.  Cardiovascular:     Rate and Rhythm: Normal rate and regular rhythm.  Pulmonary:     Effort: Pulmonary effort is normal.     Breath sounds: Examination of the right-lower field reveals rhonchi. Examination of the left-lower field reveals rhonchi. Rhonchi present.  Musculoskeletal:     Cervical back: No rigidity or tenderness.  Lymphadenopathy:     Cervical: No cervical adenopathy.  Neurological:     Mental Status: He is alert.  Psychiatric:        Attention and Perception: Attention and perception normal.        Mood and Affect: Mood and affect normal.        Speech: Speech normal.        Behavior: Behavior normal.        Thought Content: Thought content normal.    BP (!) 164/76 (BP Location: Left Arm, Patient Position: Sitting, Cuff Size: Normal)    Pulse 64    Temp (!) 96.9 F (36.1 C) (Temporal)    Ht 5\' 10"  (1.778 m)    Wt 182 lb 12.8 oz (82.9 kg)    SpO2 97%    BMI 26.23 kg/m  Wt Readings from Last 3 Encounters:  08/01/21 182 lb 12.8 oz (82.9 kg)  05/25/21 181 lb 7 oz (82.3 kg)  04/21/21 181 lb 6.4 oz (82.3 kg)     There are no preventive care reminders to display for this patient.  There are no preventive care reminders to  display for this patient.  Lab Results  Component Value Date   TSH 2.650 04/04/2018   Lab Results  Component Value Date   WBC 9.1 04/21/2021   HGB 13.9 04/21/2021   HCT 43.2 04/21/2021   MCV 86.2 04/21/2021   PLT 286.0 04/21/2021   Lab Results  Component  Value Date   NA 135 04/21/2021   K 4.4 04/21/2021   CO2 23 04/21/2021   GLUCOSE 98 04/21/2021   BUN 44 (H) 04/21/2021   CREATININE 1.91 (H) 04/21/2021   BILITOT 0.7 04/21/2021   ALKPHOS 89 04/21/2021   AST 13 04/21/2021   ALT 10 04/21/2021   PROT 7.3 04/21/2021   ALBUMIN 4.2 04/21/2021   CALCIUM 9.2 04/21/2021   GFR 30.26 (L) 04/21/2021   Lab Results  Component Value Date   CHOL 136 04/21/2021   Lab Results  Component Value Date   HDL 38.90 (L) 04/21/2021   Lab Results  Component Value Date   LDLCALC 68 04/21/2021   Lab Results  Component Value Date   TRIG 142.0 04/21/2021   Lab Results  Component Value Date   CHOLHDL 3 04/21/2021   Lab Results  Component Value Date   HGBA1C 6.2 01/19/2021      Assessment & Plan:   Problem List Items Addressed This Visit       Cardiovascular and Mediastinum   Essential hypertension - Primary   Relevant Medications   losartan (COZAAR) 50 MG tablet   chlorthalidone (HYGROTON) 25 MG tablet   Other Relevant Orders   Basic metabolic panel   CBC   Urinalysis, Routine w reflex microscopic   Microalbumin / creatinine urine ratio     Genitourinary   Stage 3a chronic kidney disease (HCC)   Relevant Medications   losartan (COZAAR) 50 MG tablet   Other Relevant Orders   Basic metabolic panel   Urinalysis, Routine w reflex microscopic   Microalbumin / creatinine urine ratio    Meds ordered this encounter  Medications   DISCONTD: losartan (COZAAR) 50 MG tablet    Sig: Take 1 tablet (50 mg total) by mouth daily.    Dispense:  90 tablet    Refill:  1   DISCONTD: chlorthalidone (HYGROTON) 25 MG tablet    Sig: Take 1 tablet (25 mg total) by mouth every morning.     Dispense:  30 tablet    Refill:  4   losartan (COZAAR) 50 MG tablet    Sig: Take 1 tablet (50 mg total) by mouth daily.    Dispense:  90 tablet    Refill:  1    Stop Aldactone.   chlorthalidone (HYGROTON) 25 MG tablet    Sig: Take 1 tablet (25 mg total) by mouth every morning.    Dispense:  90 tablet    Refill:  0    Stop aldactone.    Follow-up: Return in about 4 weeks (around 08/29/2021), or stop aldactone.  Will discontinue spironolactone and add losartan.  We will need to monitor potassium closely.  Follow-up in 4 weeks.  Libby Maw, MD

## 2021-08-02 LAB — BASIC METABOLIC PANEL
BUN: 37 mg/dL — ABNORMAL HIGH (ref 6–23)
CO2: 21 mEq/L (ref 19–32)
Calcium: 9.3 mg/dL (ref 8.4–10.5)
Chloride: 102 mEq/L (ref 96–112)
Creatinine, Ser: 2.33 mg/dL — ABNORMAL HIGH (ref 0.40–1.50)
GFR: 23.79 mL/min — ABNORMAL LOW (ref >60.00)
Glucose, Bld: 99 mg/dL (ref 70–99)
Potassium: 5 mEq/L (ref 3.5–5.1)
Sodium: 135 mEq/L (ref 135–145)

## 2021-08-02 LAB — CBC
HCT: 43.8 % (ref 39.0–52.0)
Hemoglobin: 14 g/dL (ref 13.0–17.0)
MCHC: 31.9 g/dL (ref 30.0–36.0)
MCV: 86.8 fl (ref 78.0–100.0)
Platelets: 257 10*3/uL (ref 150.0–400.0)
RBC: 5.05 Mil/uL (ref 4.22–5.81)
RDW: 13.9 % (ref 11.5–15.5)
WBC: 9.1 10*3/uL (ref 4.0–10.5)

## 2021-08-02 LAB — URINALYSIS, ROUTINE W REFLEX MICROSCOPIC
Bilirubin Urine: NEGATIVE
Hgb urine dipstick: NEGATIVE
Ketones, ur: NEGATIVE
Leukocytes,Ua: NEGATIVE
Nitrite: NEGATIVE
RBC / HPF: NONE SEEN (ref 0–?)
Specific Gravity, Urine: 1.025 (ref 1.000–1.030)
Urine Glucose: NEGATIVE
Urobilinogen, UA: 0.2 (ref 0.0–1.0)
pH: 5.5 (ref 5.0–8.0)

## 2021-08-02 LAB — MICROALBUMIN / CREATININE URINE RATIO
Creatinine,U: 104.8 mg/dL
Microalb Creat Ratio: 3.3 mg/g (ref 0.0–30.0)
Microalb, Ur: 3.5 mg/dL — ABNORMAL HIGH (ref 0.0–1.9)

## 2021-08-09 DIAGNOSIS — H34832 Tributary (branch) retinal vein occlusion, left eye, with macular edema: Secondary | ICD-10-CM | POA: Diagnosis not present

## 2021-08-09 DIAGNOSIS — H0100A Unspecified blepharitis right eye, upper and lower eyelids: Secondary | ICD-10-CM | POA: Diagnosis not present

## 2021-08-09 DIAGNOSIS — H524 Presbyopia: Secondary | ICD-10-CM | POA: Diagnosis not present

## 2021-08-09 DIAGNOSIS — H52203 Unspecified astigmatism, bilateral: Secondary | ICD-10-CM | POA: Diagnosis not present

## 2021-08-09 DIAGNOSIS — H43813 Vitreous degeneration, bilateral: Secondary | ICD-10-CM | POA: Diagnosis not present

## 2021-08-09 DIAGNOSIS — H0100B Unspecified blepharitis left eye, upper and lower eyelids: Secondary | ICD-10-CM | POA: Diagnosis not present

## 2021-08-09 DIAGNOSIS — Z961 Presence of intraocular lens: Secondary | ICD-10-CM | POA: Diagnosis not present

## 2021-08-09 DIAGNOSIS — H5203 Hypermetropia, bilateral: Secondary | ICD-10-CM | POA: Diagnosis not present

## 2021-08-15 ENCOUNTER — Telehealth: Payer: Self-pay

## 2021-08-15 NOTE — Progress Notes (Signed)
Chronic Care Management Pharmacy Assistant   Name: [name]  MRN: [id number] DOB: 03/12/1928  Reason for Encounter: Medication Review.Medication Coordination Call.   Recent office visits:  08/01/2021 Dr. Ethelene Hal MD (PCP) start losartan 50 mg daily, stop spironolactone 25 mg,Return in about 4 weeks   Recent consult visits:  No recent consult visit  Hospital visits:  None in previous 6 months  Medications: Outpatient Encounter Medications as of 08/15/2021  Medication Sig   acetaminophen (TYLENOL) 500 MG tablet Take 500 mg by mouth every 6 (six) hours as needed.   amLODipine (NORVASC) 10 MG tablet Take 1 tablet (10 mg total) by mouth every morning.   Carboxymethylcellulose Sod PF 0.5 % SOLN Place 1 drop into both eyes as needed (Dry Eyes).   chlorthalidone (HYGROTON) 25 MG tablet Take 1 tablet (25 mg total) by mouth every morning.   Cholecalciferol (VITAMIN D3) 2000 units TABS Take 1 tablet by mouth daily.   clopidogrel (PLAVIX) 75 MG tablet Take 1 tablet (75 mg total) by mouth every morning.   docusate sodium (COLACE) 100 MG capsule Take 1 capsule (100 mg total) by mouth 2 (two) times daily.   gabapentin (NEURONTIN) 300 MG capsule Take one capsule each morning and 2 in the evening prior to bedtime.   losartan (COZAAR) 50 MG tablet Take 1 tablet (50 mg total) by mouth daily.   simvastatin (ZOCOR) 10 MG tablet TAKE ONE TABLET BY MOUTH EVERYDAY AT BEDTIME   vitamin B-12 (CYANOCOBALAMIN) 1000 MCG tablet Take 1,000 mcg by mouth daily.   vitamin C (ASCORBIC ACID) 500 MG tablet Take 500 mg by mouth daily.   No facility-administered encounter medications on file as of 08/15/2021.    Care Gaps: HTN: 164/76 on 08/01/2021 Star Rating Drugs: Simvastatin 25 mg last filled on 07/20/2021 for 30 day supply at [facility] Losartan 50 mg not on fill report Medication Fill Gaps: None  Reviewed chart for medication changes ahead of medication coordination call.   BP Readings  from Last 3 Encounters:  08/01/21 (!) 164/76  05/25/21 (!) 146/86  04/21/21 140/84    Lab Results  Component Value Date   HGBA1C 6.2 01/19/2021     Patient obtains medications through Adherence Packaging  30 Days   Last adherence delivery included:  Clopidogrel 75 mg daily - breakfast Gabapentin 300 mg Take one capsule each morning and 2 in the evening prior to bedtime. Simvastatin 10 mg daily - bedtime Chlorthalidone 25 mg daily - breakfast Vitamin D3 2000 units daily - breakfast Vitamin C 500 mg daily - breakfast Amlodipine 10 mg daily - breakfast  Spironolactone 25 mg daily-breakfast Docusate sodium 100 mg twice daily- breakfast, evening meal  Vitamin B12 1000 MG daily -Breakfast  Patient declined medications last month : Retaine Eye Drops 0.5% (Adequate supply) Acetaminophen 500 mg PRN Saline Nose Solution filled on 01/17/2021 for 30 day supply  Patient is due for next adherence delivery on: 08/25/2021. Called patient and reviewed medications and coordinated delivery.  This delivery to include: Clopidogrel 75 mg daily - breakfast Gabapentin 300 mg Take one capsule each morning and 2 in the evening prior to bedtime. Simvastatin 10 mg daily - bedtime Chlorthalidone 25 mg daily - breakfast Vitamin D3 2000 units daily - breakfast Vitamin C 500 mg daily - breakfast Amlodipine 10 mg daily - breakfast  Losartan 50 mg daily -Breakfast Docusate sodium 100 mg twice daily- breakfast, evening meal  Vitamin B12 1000 MG daily -Breakfast  Patient declined the following  medications Retaine Eye Drops 0.5% (Adequate supply) Acetaminophen 500 mg PRN Saline Nose Solution - Adequate supply  Patient needs refills for None .  Confirmed delivery date of 08/25/2021 (First route), advised patient that pharmacy will contact them the morning of delivery.  Schedule appt [place] Pharmacist Assistant [phone]

## 2021-08-29 ENCOUNTER — Other Ambulatory Visit: Payer: Self-pay

## 2021-08-29 ENCOUNTER — Ambulatory Visit (INDEPENDENT_AMBULATORY_CARE_PROVIDER_SITE_OTHER): Payer: Medicare HMO | Admitting: Family Medicine

## 2021-08-29 ENCOUNTER — Encounter: Payer: Self-pay | Admitting: Family Medicine

## 2021-08-29 VITALS — BP 132/64 | HR 62 | Temp 97.0°F | Ht 70.0 in | Wt 193.0 lb

## 2021-08-29 DIAGNOSIS — N184 Chronic kidney disease, stage 4 (severe): Secondary | ICD-10-CM | POA: Diagnosis not present

## 2021-08-29 DIAGNOSIS — N1832 Chronic kidney disease, stage 3b: Secondary | ICD-10-CM

## 2021-08-29 DIAGNOSIS — I10 Essential (primary) hypertension: Secondary | ICD-10-CM

## 2021-08-29 DIAGNOSIS — J8410 Pulmonary fibrosis, unspecified: Secondary | ICD-10-CM

## 2021-08-29 LAB — BASIC METABOLIC PANEL
BUN: 43 mg/dL — ABNORMAL HIGH (ref 6–23)
CO2: 20 mEq/L (ref 19–32)
Calcium: 8.7 mg/dL (ref 8.4–10.5)
Chloride: 106 mEq/L (ref 96–112)
Creatinine, Ser: 2.57 mg/dL — ABNORMAL HIGH (ref 0.40–1.50)
GFR: 21.14 mL/min — ABNORMAL LOW (ref >60.00)
Glucose, Bld: 96 mg/dL (ref 70–99)
Potassium: 4.9 mEq/L (ref 3.5–5.1)
Sodium: 135 mEq/L (ref 135–145)

## 2021-08-29 NOTE — Progress Notes (Addendum)
Established Patient Office Visit  Subjective:  Patient ID: [name], male    DOB: 11-04-1927  Age: 88 y.o. MRN: [id number]  CC:  Chief Complaint  Patient presents with   Follow-up    Follow up on medication, no concerns.     HPI [name] presents for follow-up of hypertension under better control with losartan 100 mg and chlorthalidone 25 mg.  Here to recheck renal function.  He assures me that he has been hydrating well at least 48 ounces daily.  He has 1 cup of coffee in the morning and a glass of red wine at night.  Past Medical History:  Diagnosis Date   Branch retinal vein occlusion of left eye    High cholesterol    Hypertension    TIA (transient ischemic attack)     Past Surgical History:  Procedure Laterality Date   CAROTID ENDARTERECTOMY Right    HERNIA REPAIR     INNER EAR SURGERY Right    for hearing loss    Family History  Problem Relation Age of Onset   Lung cancer Father     Social History   Socioeconomic History   Marital status: Widowed    Spouse name: Not on file   Number of children: 4   Years of education: Not on file   Highest education level: Not on file  Occupational History    Comment: retired, 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  Tobacco Use   Smoking status: Never   Smokeless tobacco: Never  Vaping Use   Vaping Use: Never used  Substance and Sexual Activity   Alcohol use: Yes    Comment: 1 glass wine dialy   Drug use: Never   Sexual activity: Not Currently  Other Topics Concern   Not on file  Social History Narrative   Lives alone   Social Determinants of Health   Financial Resource Strain: Not on file  Food Insecurity: Not on file  Transportation Needs: No Transportation Needs   Lack of Transportation (Medical): No   Lack of Transportation (Non-Medical): No  Physical Activity: Not on file  Stress: Not on file  Social Connections: Not on file  Intimate Partner Violence: Not on file    Outpatient Medications  Prior to Visit  Medication Sig Dispense Refill   acetaminophen (TYLENOL) 500 MG tablet Take 500 mg by mouth every 6 (six) hours as needed.     amLODipine (NORVASC) 10 MG tablet Take 1 tablet (10 mg total) by mouth every morning. 90 tablet 1   Carboxymethylcellulose Sod PF 0.5 % SOLN Place 1 drop into both eyes as needed (Dry Eyes).     chlorthalidone (HYGROTON) 25 MG tablet Take 1 tablet (25 mg total) by mouth every morning. 90 tablet 0   Cholecalciferol (VITAMIN D3) 2000 units TABS Take 1 tablet by mouth daily.     clopidogrel (PLAVIX) 75 MG tablet Take 1 tablet (75 mg total) by mouth every morning. 90 tablet 1   docusate sodium (COLACE) 100 MG capsule Take 1 capsule (100 mg total) by mouth 2 (two) times daily. 100 capsule 0   gabapentin (NEURONTIN) 300 MG capsule Take one capsule each morning and 2 in the evening prior to bedtime. 90 capsule 3   losartan (COZAAR) 50 MG tablet Take 1 tablet (50 mg total) by mouth daily. 90 tablet 1   simvastatin (ZOCOR) 10 MG tablet TAKE ONE TABLET BY MOUTH EVERYDAY AT BEDTIME 90 tablet 1   vitamin B-12 (CYANOCOBALAMIN) 1000  MCG tablet Take 1,000 mcg by mouth daily.     vitamin C (ASCORBIC ACID) 500 MG tablet Take 500 mg by mouth daily.     No facility-administered medications prior to visit.    Allergies  Allergen Reactions   Clonidine     Other reaction(s): Other (See Comments) Dry mouth and ineffective per patient.   Codeine Other (See Comments)    No reaction noted   Lisinopril     hyperkalemia   Penicillins     REACTION: rash    ROS Review of Systems  Constitutional:  Negative for diaphoresis, fatigue, fever and unexpected weight change.  HENT: Negative.    Eyes:  Negative for photophobia and visual disturbance.  Respiratory: Negative.    Cardiovascular: Negative.   Gastrointestinal: Negative.   Endocrine: Negative for polyphagia and polyuria.  Genitourinary: Negative.  Negative for decreased urine volume.  Musculoskeletal:  Negative  for gait problem and joint swelling.  Skin:  Negative for color change and pallor.  Neurological:  Negative for speech difficulty and weakness.  Psychiatric/Behavioral: Negative.       Objective:    Physical Exam Vitals and nursing note reviewed.  Constitutional:      General: He is not in acute distress.    Appearance: Normal appearance. He is not ill-appearing, toxic-appearing or diaphoretic.  HENT:     Head: Normocephalic and atraumatic.     Right Ear: External ear normal.     Left Ear: External ear normal.  Eyes:     General: No scleral icterus.       Right eye: No discharge.        Left eye: No discharge.     Extraocular Movements: Extraocular movements intact.     Conjunctiva/sclera: Conjunctivae normal.  Cardiovascular:     Rate and Rhythm: Normal rate and regular rhythm.  Pulmonary:     Breath sounds: Examination of the right-lower field reveals rhonchi. Examination of the left-lower field reveals rhonchi. Rhonchi present.  Skin:    General: Skin is warm and dry.  Neurological:     Mental Status: He is alert and oriented to person, place, and time.  Psychiatric:        Mood and Affect: Mood normal.        Behavior: Behavior normal.    BP 132/64 (BP Location: Right Arm, Patient Position: Sitting, Cuff Size: Normal)    Pulse 62    Temp (!) 97 F (36.1 C) (Temporal)    Ht 5\' 10"  (1.778 m)    Wt 193 lb (87.5 kg)    SpO2 97%    BMI 27.69 kg/m  Wt Readings from Last 3 Encounters:  08/29/21 193 lb (87.5 kg)  08/01/21 182 lb 12.8 oz (82.9 kg)  05/25/21 181 lb 7 oz (82.3 kg)     There are no preventive care reminders to display for this patient.  There are no preventive care reminders to display for this patient.  Lab Results  Component Value Date   TSH 2.650 04/04/2018   Lab Results  Component Value Date   WBC 9.1 08/01/2021   HGB 14.0 08/01/2021   HCT 43.8 08/01/2021   MCV 86.8 08/01/2021   PLT 257.0 08/01/2021   Lab Results  Component Value Date   NA  135 08/29/2021   K 4.9 08/29/2021   CO2 20 08/29/2021   GLUCOSE 96 08/29/2021   BUN 43 (H) 08/29/2021   CREATININE 2.57 (H) 08/29/2021   BILITOT 0.7 04/21/2021   ALKPHOS  89 04/21/2021   AST 13 04/21/2021   ALT 10 04/21/2021   PROT 7.3 04/21/2021   ALBUMIN 4.2 04/21/2021   CALCIUM 8.7 08/29/2021   GFR 21.14 (L) 08/29/2021   Lab Results  Component Value Date   CHOL 136 04/21/2021   Lab Results  Component Value Date   HDL 38.90 (L) 04/21/2021   Lab Results  Component Value Date   LDLCALC 68 04/21/2021   Lab Results  Component Value Date   TRIG 142.0 04/21/2021   Lab Results  Component Value Date   CHOLHDL 3 04/21/2021   Lab Results  Component Value Date   HGBA1C 6.2 01/19/2021      Assessment & Plan:   Problem List Items Addressed This Visit       Cardiovascular and Mediastinum   Essential hypertension - Primary   Relevant Orders   Basic metabolic panel (Completed)     Respiratory   Fibrotic lung diseases ([place])     Genitourinary   Stage 3b chronic kidney disease ([place])   Relevant Orders   Basic metabolic panel (Completed)   Other Visit Diagnoses     Chronic kidney disease, stage 4 (severe) ([place])       Relevant Orders   Ambulatory referral to Nephrology       No orders of the defined types were placed in this encounter.   Follow-up: Return in 2 months (on 10/27/2021), or if symptoms worsen or fail to improve.  Recommended follow-up of pending results of today's lab.  Nephrology consultation?  Persisting decreased GFR, may need to switch to loop diuretic.  Libby Maw, MD

## 2021-08-30 MED ORDER — FUROSEMIDE 20 MG PO TABS: 20.0000 mg | ORAL_TABLET | Freq: Every day | ORAL | 3 refills | Status: DC

## 2021-08-30 NOTE — Progress Notes (Signed)
Unfortunately renal function has declined further.  Please discontinue chlorthalidone.  Have added another diuretic, frusemide or Lasix, to be taken each morning.  Have ordered consultation with the renal specialist.  Follow-up with me in 2 months.

## 2021-08-30 NOTE — Addendum Note (Signed)
Addended by: Jon Billings on: 08/30/2021 07:50 AM   Modules accepted: Orders

## 2021-08-30 NOTE — Addendum Note (Signed)
Addended by: Jon Billings on: 08/30/2021 07:48 AM   Modules accepted: Orders

## 2021-09-14 ENCOUNTER — Telehealth: Payer: Self-pay

## 2021-09-14 NOTE — Progress Notes (Signed)
Chronic Care Management Pharmacy Assistant   Name: [name]  MRN: [id number] DOB: 1928-01-31  Reason for Encounter: Medication Review/Medication Coordination Call.   Recent office visits:  08/29/2021 Dr. Ethelene Hal MD (PCP)Stop Chlorthalidone, start furosemide 20 mg daily, Ambulatory referral to Nephrology, Ambulatory referral to Nephrology  Recent consult visits:  No [facility] visits:  None in previous 6 months  Medications: Outpatient Encounter Medications as of 09/14/2021  Medication Sig   acetaminophen (TYLENOL) 500 MG tablet Take 500 mg by mouth every 6 (six) hours as needed.   amLODipine (NORVASC) 10 MG tablet Take 1 tablet (10 mg total) by mouth every morning.   Carboxymethylcellulose Sod PF 0.5 % SOLN Place 1 drop into both eyes as needed (Dry Eyes).   Cholecalciferol (VITAMIN D3) 2000 units TABS Take 1 tablet by mouth daily.   clopidogrel (PLAVIX) 75 MG tablet Take 1 tablet (75 mg total) by mouth every morning.   docusate sodium (COLACE) 100 MG capsule Take 1 capsule (100 mg total) by mouth 2 (two) times daily.   furosemide (LASIX) 20 MG tablet Take 1 tablet (20 mg total) by mouth daily.   gabapentin (NEURONTIN) 300 MG capsule Take one capsule each morning and 2 in the evening prior to bedtime.   losartan (COZAAR) 50 MG tablet Take 1 tablet (50 mg total) by mouth daily.   simvastatin (ZOCOR) 10 MG tablet TAKE ONE TABLET BY MOUTH EVERYDAY AT BEDTIME   vitamin B-12 (CYANOCOBALAMIN) 1000 MCG tablet Take 1,000 mcg by mouth daily.   vitamin C (ASCORBIC ACID) 500 MG tablet Take 500 mg by mouth daily.   No facility-administered encounter medications on file as of 09/14/2021.    Care Gaps: None ID  Star Rating Drugs: Simvastatin 25 mg last filled on 08/18/2021 for 30 day supply at [facility] Losartan 50 mg last filled 08/18/2021 for 30 day supply at [facility].  Medication Fill Gaps: None  Reviewed chart for medication changes  ahead of medication coordination call.  BP Readings from Last 3 Encounters:  08/29/21 132/64  08/01/21 (!) 164/76  05/25/21 (!) 146/86    Lab Results  Component Value Date   HGBA1C 6.2 01/19/2021     Patient obtains medications through Adherence Packaging  30 Days   Last adherence delivery included:  Clopidogrel 75 mg daily - breakfast Gabapentin 300 mg Take one capsule each morning and 2 in the evening prior to bedtime. Simvastatin 10 mg daily - bedtime Chlorthalidone 25 mg daily - breakfast Vitamin D3 2000 units daily - breakfast Vitamin C 500 mg daily - breakfast Amlodipine 10 mg daily - breakfast  Losartan 50 mg daily -Breakfast Docusate sodium 100 mg twice daily- breakfast, evening meal  Vitamin B12 1000 MG daily -Breakfast  Patient declined medications last month Retaine Eye Drops 0.5% (Adequate supply) Acetaminophen 500 mg PRN Saline Nose Solution - Adequate supply   Patient is due for next adherence delivery on: 09/26/2021. Called patient and reviewed medications and coordinated delivery.  This delivery to include: Clopidogrel 75 mg 1 tablet daily - breakfast Gabapentin 300 mg Take one capsule each morning and 2 in the evening prior to bedtime.Patient states he want one tablet at Breakfast, one tablet at evening meals , and one tablet at bedtime. Simvastatin 10 mg 1 tablet daily - bedtime Vitamin D3 2000 units 1 tablet daily - breakfast Vitamin C 500 mg 1 tablet daily - breakfast Amlodipine 10 mg 1 tablet daily - breakfast  Losartan 50 mg  1 tablet daily -Breakfast Docusate sodium 100 mg 1 tablet  twice daily- breakfast, evening meal  Vitamin B12 1000 MG daily -Breakfast Furosemide 20 mg 1 tablet daily- Breakfast Systane Eye drops 0.6%- 1 drop into both eye PRN  Patient declined the following medications: Chlorthalidone 25 mg daily - breakfast (PCP stopped medication) Retaine Eye Drops 0.5% (Adequate supply) Acetaminophen 500 mg PRN Saline Nose Solution -  Adequate supply  Patient needs refills for None ID.  Confirmed delivery date of 09/26/2021 ([facility]) , advised patient that pharmacy will contact them the morning of delivery.  Patient Agreed to schedule a telephone follow up with the clinical pharmacist on 01/04/2022 at 3:00 pm  [place] Pharmacist Assistant [phone]

## 2021-09-27 DIAGNOSIS — N2581 Secondary hyperparathyroidism of renal origin: Secondary | ICD-10-CM | POA: Diagnosis not present

## 2021-09-27 DIAGNOSIS — N184 Chronic kidney disease, stage 4 (severe): Secondary | ICD-10-CM | POA: Diagnosis not present

## 2021-09-27 DIAGNOSIS — E785 Hyperlipidemia, unspecified: Secondary | ICD-10-CM | POA: Diagnosis not present

## 2021-09-27 DIAGNOSIS — I129 Hypertensive chronic kidney disease with stage 1 through stage 4 chronic kidney disease, or unspecified chronic kidney disease: Secondary | ICD-10-CM | POA: Diagnosis not present

## 2021-09-27 DIAGNOSIS — D631 Anemia in chronic kidney disease: Secondary | ICD-10-CM | POA: Diagnosis not present

## 2021-09-29 ENCOUNTER — Other Ambulatory Visit: Payer: Self-pay | Admitting: Nephrology

## 2021-09-29 DIAGNOSIS — N184 Chronic kidney disease, stage 4 (severe): Secondary | ICD-10-CM

## 2021-10-02 DIAGNOSIS — H66002 Acute suppurative otitis media without spontaneous rupture of ear drum, left ear: Secondary | ICD-10-CM | POA: Diagnosis not present

## 2021-10-02 DIAGNOSIS — H9202 Otalgia, left ear: Secondary | ICD-10-CM | POA: Diagnosis not present

## 2021-10-02 DIAGNOSIS — H9192 Unspecified hearing loss, left ear: Secondary | ICD-10-CM | POA: Diagnosis not present

## 2021-10-11 ENCOUNTER — Ambulatory Visit
Admission: RE | Admit: 2021-10-11 | Discharge: 2021-10-11 | Disposition: A | Discharge status: Home / Self Care | Payer: Medicare HMO | Source: Ambulatory Visit | Attending: Nephrology | Admitting: Nephrology

## 2021-10-11 DIAGNOSIS — N184 Chronic kidney disease, stage 4 (severe): Secondary | ICD-10-CM

## 2021-10-11 DIAGNOSIS — N189 Chronic kidney disease, unspecified: Secondary | ICD-10-CM | POA: Diagnosis not present

## 2021-10-12 DIAGNOSIS — H6092 Unspecified otitis externa, left ear: Secondary | ICD-10-CM | POA: Diagnosis not present

## 2021-10-13 ENCOUNTER — Telehealth: Payer: Self-pay

## 2021-10-13 NOTE — Progress Notes (Signed)
? ? ?Chronic Care Management ?Pharmacy Assistant  ? ?Name: [name]  MRN: [id number] DOB: [birth year] ? ?Reason for Encounter: Medication Review/Medication Coordination Call. ?  ?Recent office visits:  ?No recent office visit ? ?Recent consult visits:  ?NO recent consult visit ? ?Hospital visits:  ?None in previous 6 months ? ?Medications: ?Outpatient Encounter Medications as of 10/13/2021  ?Medication Sig  ? acetaminophen (TYLENOL) 500 MG tablet Take 500 mg by mouth every 6 (six) hours as needed.  ? amLODipine (NORVASC) 10 MG tablet Take 1 tablet (10 mg total) by mouth every morning.  ? Carboxymethylcellulose Sod PF 0.5 % SOLN Place 1 drop into both eyes as needed (Dry Eyes).  ? Cholecalciferol (VITAMIN D3) 2000 units TABS Take 1 tablet by mouth daily.  ? clopidogrel (PLAVIX) 75 MG tablet Take 1 tablet (75 mg total) by mouth every morning.  ? docusate sodium (COLACE) 100 MG capsule Take 1 capsule (100 mg total) by mouth 2 (two) times daily.  ? furosemide (LASIX) 20 MG tablet Take 1 tablet (20 mg total) by mouth daily.  ? gabapentin (NEURONTIN) 300 MG capsule Take one capsule each morning and 2 in the evening prior to bedtime. (Patient taking differently: Take one capsule three times daily)  ? losartan (COZAAR) 50 MG tablet Take 1 tablet (50 mg total) by mouth daily.  ? simvastatin (ZOCOR) 10 MG tablet TAKE ONE TABLET BY MOUTH EVERYDAY AT BEDTIME  ? vitamin B-12 (CYANOCOBALAMIN) 1000 MCG tablet Take 1,000 mcg by mouth daily.  ? vitamin C (ASCORBIC ACID) 500 MG tablet Take 500 mg by mouth daily.  ? ?No facility-administered encounter medications on file as of 10/13/2021.  ? ?Care Gaps: ?None ID ?  ?Star Rating Drugs: ?Simvastatin 25 mg last filled on 09/19/2021 for 30 day supply at [facility] ?Losartan 50 mg last filled 09/19/2021 for 30 day supply at [facility]. ?  ?Medication Fill Gaps: ?None ? ?Reviewed chart for medication changes ahead of medication coordination call. ? ?BP Readings from Last 3  Encounters:  ?08/29/21 132/64  ?08/01/21 (!) 164/76  ?05/25/21 (!) 146/86  ?  ?Lab Results  ?Component Value Date  ? HGBA1C 6.2 01/19/2021  ?  ? ?Patient obtains medications through Adherence Packaging  30 Days  ? ?Last adherence delivery included:  ?Clopidogrel 75 mg 1 tablet daily - breakfast ?Gabapentin 300 mg Take one capsule each morning and 2 in the evening prior to bedtime.Patient states he want one tablet at Breakfast, one tablet at evening meals , and one tablet at bedtime. ?Simvastatin 10 mg 1 tablet daily - bedtime ?Vitamin D3 2000 units 1 tablet daily - breakfast ?Vitamin C 500 mg 1 tablet daily - breakfast ?Amlodipine 10 mg 1 tablet daily - breakfast  ?Losartan 50 mg 1 tablet daily -Breakfast ?Docusate sodium 100 mg 1 tablet  twice daily- breakfast, evening meal ? Vitamin B12 1000 MG daily -Breakfast ?Furosemide 20 mg 1 tablet daily- Breakfast ?Systane Eye drops 0.6%- 1 drop into both eye PRN ? ?Patient declined medications last month: ?Chlorthalidone 25 mg daily - breakfast (PCP stopped medication) ?Retaine Eye Drops 0.5% (Adequate supply) ?Acetaminophen 500 mg PRN ?Saline Nose Solution - Adequate supply ? ?Patient is due for next adherence delivery on: 10/25/2021. ?Called patient and reviewed medications and coordinated delivery. ? ?This delivery to include: ?Clopidogrel 75 mg 1 tablet daily - breakfast ?Gabapentin 300 mg Take one capsule each morning and 2 in the evening prior to bedtime.Patient states he want one tablet at Breakfast, one tablet  at evening meals , and one tablet at bedtime. ?Simvastatin 10 mg 1 tablet daily - bedtime ?Vitamin D3 2000 units 1 tablet daily - breakfast ?Vitamin C 500 mg 1 tablet daily - breakfast ?Amlodipine 10 mg 1 tablet daily - breakfast  ?Losartan 50 mg 1 tablet daily -Breakfast ?Docusate sodium 100 mg 1 tablet  twice daily- breakfast, evening meal ? Vitamin B12 1000 MG daily -Breakfast ?Furosemide 20 mg 1 tablet daily- Breakfast ?Systane Eye drops 0.6%- 1 drop into  both eye PRN ?Sodium bicarbonate 650 mg one tablet two times daily -( Patient wants this in vials as he is confuse if he needs to take this long term.Patient states his nephrologist is going to run test regarding his kidneys and will inform him if he needs to continue it). ? ? ?Patient declined the following medications  ?Retaine Eye Drops 0.5% (Adequate supply) ?Acetaminophen 500 mg PRN ?Saline Nose Solution - Adequate supply ? ?Patient needs refills for Clopidogrel prescribe by PCP. ? ?Confirmed delivery date of 10/25/2021 ([facility]) , advised patient that pharmacy will contact them the morning of delivery. ? ?Anderson Malta ?Clinical Pharmacist Assistant ?[phone]   ?

## 2021-10-19 ENCOUNTER — Other Ambulatory Visit: Payer: Self-pay | Admitting: Family Medicine

## 2021-10-19 DIAGNOSIS — G459 Transient cerebral ischemic attack, unspecified: Secondary | ICD-10-CM

## 2021-10-19 DIAGNOSIS — N184 Chronic kidney disease, stage 4 (severe): Secondary | ICD-10-CM | POA: Diagnosis not present

## 2021-11-02 DIAGNOSIS — H6092 Unspecified otitis externa, left ear: Secondary | ICD-10-CM | POA: Diagnosis not present

## 2021-11-08 ENCOUNTER — Other Ambulatory Visit: Payer: Self-pay | Admitting: Family Medicine

## 2021-11-08 DIAGNOSIS — E7800 Pure hypercholesterolemia, unspecified: Secondary | ICD-10-CM

## 2021-11-08 DIAGNOSIS — I6523 Occlusion and stenosis of bilateral carotid arteries: Secondary | ICD-10-CM

## 2021-11-13 ENCOUNTER — Telehealth: Payer: Self-pay

## 2021-11-13 DIAGNOSIS — G629 Polyneuropathy, unspecified: Secondary | ICD-10-CM

## 2021-11-13 DIAGNOSIS — I10 Essential (primary) hypertension: Secondary | ICD-10-CM

## 2021-11-13 NOTE — Progress Notes (Signed)
? ? ?Chronic Care Management ?Pharmacy Assistant  ? ?Name: [name]  MRN: [id number] DOB: 05-Sep-1927 ? ?Reason for Encounter: Medication Review/Medication Coordination Call. ?  ?Recent office visits:  ?No recent office visit ? ?Recent consult visits:  ?11/02/2020 Pietro Cassis PA-C (Otolaryngology) No Medication Changes noted ? ?Hospital visits:  ?None in previous 6 months ? ?Medications: ?Outpatient Encounter Medications as of 11/13/2021  ?Medication Sig  ? acetaminophen (TYLENOL) 500 MG tablet Take 500 mg by mouth every 6 (six) hours as needed.  ? amLODipine (NORVASC) 10 MG tablet Take 1 tablet (10 mg total) by mouth every morning.  ? Carboxymethylcellulose Sod PF 0.5 % SOLN Place 1 drop into both eyes as needed (Dry Eyes).  ? Cholecalciferol (VITAMIN D3) 2000 units TABS Take 1 tablet by mouth daily.  ? clopidogrel (PLAVIX) 75 MG tablet TAKE ONE TABLET BY MOUTH EVERY MORNING  ? docusate sodium (COLACE) 100 MG capsule Take 1 capsule (100 mg total) by mouth 2 (two) times daily.  ? furosemide (LASIX) 20 MG tablet Take 1 tablet (20 mg total) by mouth daily.  ? gabapentin (NEURONTIN) 300 MG capsule Take one capsule each morning and 2 in the evening prior to bedtime. (Patient taking differently: Take one capsule three times daily)  ? losartan (COZAAR) 50 MG tablet Take 1 tablet (50 mg total) by mouth daily.  ? simvastatin (ZOCOR) 10 MG tablet TAKE ONE TABLET BY MOUTH EVERYDAY AT BEDTIME  ? vitamin B-12 (CYANOCOBALAMIN) 1000 MCG tablet Take 1,000 mcg by mouth daily.  ? vitamin C (ASCORBIC ACID) 500 MG tablet Take 500 mg by mouth daily.  ? ?No facility-administered encounter medications on file as of 11/13/2021.  ? ? ?Care Gaps: ?None ID ?  ?Star Rating Drugs: ?Simvastatin 25 mg last filled on 10/19/2021 for 30 day supply at [facility] ?Losartan 50 mg last filled 10/19/2021 for 30 day supply at [facility]. ?  ?Medication Fill Gaps: ?None ? ?Reviewed chart for medication changes ahead of medication  coordination call. ? ? ?BP Readings from Last 3 Encounters:  ?08/29/21 132/64  ?08/01/21 (!) 164/76  ?05/25/21 (!) 146/86  ?  ?Lab Results  ?Component Value Date  ? HGBA1C 6.2 01/19/2021  ?  ? ?Patient obtains medications through Adherence Packaging  30 Days  ? ?Last adherence delivery included:  ?Clopidogrel 75 mg 1 tablet daily - breakfast ?Gabapentin 300 mg Take one capsule each morning and 2 in the evening prior to bedtime.Patient states he want one tablet at Breakfast, one tablet at evening meals , and one tablet at bedtime. ?Simvastatin 10 mg 1 tablet daily - bedtime ?Vitamin D3 2000 units 1 tablet daily - breakfast ?Vitamin C 500 mg 1 tablet daily - breakfast ?Amlodipine 10 mg 1 tablet daily - breakfast  ?Losartan 50 mg 1 tablet daily -Breakfast ?Docusate sodium 100 mg 1 tablet  twice daily- breakfast, evening meal ? Vitamin B12 1000 MG daily -Breakfast ?Furosemide 20 mg 1 tablet daily- Breakfast ?Systane Eye drops 0.6%- 1 drop into both eye PRN ?Sodium bicarbonate 650 mg one tablet two times daily -( Patient wants this in vials as he is confuse if he needs to take this long term.Patient states his nephrologist is going to run test regarding his kidneys and will inform him if he needs to continue it). ?  ?Patient declined medications last month ?Retaine Eye Drops 0.5% (Adequate supply) ?Acetaminophen 500 mg PRN ?Saline Nose Solution - Adequate supply ? ?Patient is due for next adherence delivery on: 11/23/2021. ?Called patient  and reviewed medications and coordinated delivery. ? ?This delivery to include: ?Clopidogrel 75 mg 1 tablet daily - breakfast ?Gabapentin 300 mg Take one capsule each morning and 2 in the evening prior to bedtime.Patient states he want one tablet at Breakfast, one tablet at evening meals , and one tablet at bedtime. ?Simvastatin 10 mg 1 tablet daily - bedtime ?Vitamin D3 2000 units 1 tablet daily - breakfast ?Vitamin C 500 mg 1 tablet daily - breakfast ?Amlodipine 10 mg 1 tablet daily -  breakfast  ?Losartan 50 mg 1 tablet daily -Breakfast ?Docusate sodium 100 mg 1 tablet  twice daily- breakfast, evening meal ? Vitamin B12 1000 MG daily -Breakfast ?Furosemide 20 mg 1 tablet daily- Breakfast ?Systane Eye drops 0.6%- 1 drop into both eye PRN ?Sodium bicarbonate 650 mg one tablet two times daily -( Patient wants this in vials as he is confuse if he needs to take this long term.Patient states his nephrologist is going to run test regarding his kidneys and will inform him if he needs to continue it). ?Acetaminophen 500 mg PRN ?Fluticasone nasal spray 50 mcg 2 sprays in each nostrils every night ? ? ?Patient declined the following medications  ?Retaine Eye Drops 0.5% (Adequate supply) ?Saline Nose Solution - Adequate supply ? ?Patient needs refills for Amlodipine, Gabapentin prescribe by PCP. ? ?Confirmed delivery date of [facility]/23/2021 [facility]), advised patient that pharmacy will contact them the morning of delivery. ? ?Telephone follow up appointment with Care management team member scheduled for : 01/04/2022 at 3:00 pm. ? ?Anderson Malta ?Clinical Pharmacist Assistant ?[phone]  ?. ?] ?

## 2021-11-14 ENCOUNTER — Telehealth: Payer: Self-pay

## 2021-11-14 MED ORDER — GABAPENTIN 300 MG PO CAPS: 300.0000 mg | ORAL_CAPSULE | Freq: Three times a day (TID) | ORAL | 1 refills | Status: DC

## 2021-11-14 MED ORDER — AMLODIPINE BESYLATE 10 MG PO TABS: 10.0000 mg | ORAL_TABLET | Freq: Every morning | ORAL | 1 refills | Status: DC

## 2021-11-14 NOTE — Addendum Note (Signed)
Addended by: Daron Offer A on: 11/14/2021 08:34 AM ? ? Modules accepted: Orders ? ?

## 2021-11-14 NOTE — Progress Notes (Signed)
[facility], patient  has been getting amlodipine 5mg  2 tabs at breakfast, do we need to go ahead and switch to amlodipine 10mg  1 tab at breakfast? Is the patient aware of this change? There is not a note of this on the form so I wanted to check because the amlodipine 5mg  2 tabs at breakfast is what I have on the form currently. ? ?Patient confirm he is okay with taking Amlodipine 10 mg daily at breakfast. ? ?Anderson Malta ?Clinical Pharmacist Assistant ?[phone]  ? ?

## 2021-12-04 DIAGNOSIS — N184 Chronic kidney disease, stage 4 (severe): Secondary | ICD-10-CM | POA: Diagnosis not present

## 2021-12-04 DIAGNOSIS — E785 Hyperlipidemia, unspecified: Secondary | ICD-10-CM | POA: Diagnosis not present

## 2021-12-04 DIAGNOSIS — N2581 Secondary hyperparathyroidism of renal origin: Secondary | ICD-10-CM | POA: Diagnosis not present

## 2021-12-04 DIAGNOSIS — I129 Hypertensive chronic kidney disease with stage 1 through stage 4 chronic kidney disease, or unspecified chronic kidney disease: Secondary | ICD-10-CM | POA: Diagnosis not present

## 2021-12-04 DIAGNOSIS — D631 Anemia in chronic kidney disease: Secondary | ICD-10-CM | POA: Diagnosis not present

## 2021-12-04 DIAGNOSIS — E875 Hyperkalemia: Secondary | ICD-10-CM | POA: Diagnosis not present

## 2021-12-12 ENCOUNTER — Ambulatory Visit (INDEPENDENT_AMBULATORY_CARE_PROVIDER_SITE_OTHER): Payer: Medicare HMO | Admitting: Family Medicine

## 2021-12-12 ENCOUNTER — Encounter: Payer: Self-pay | Admitting: Family Medicine

## 2021-12-12 VITALS — BP 146/70 | HR 67 | Temp 96.8°F | Ht 70.0 in | Wt 190.6 lb

## 2021-12-12 DIAGNOSIS — N1832 Chronic kidney disease, stage 3b: Secondary | ICD-10-CM

## 2021-12-12 DIAGNOSIS — J8410 Pulmonary fibrosis, unspecified: Secondary | ICD-10-CM | POA: Diagnosis not present

## 2021-12-12 DIAGNOSIS — I10 Essential (primary) hypertension: Secondary | ICD-10-CM | POA: Diagnosis not present

## 2021-12-12 LAB — BASIC METABOLIC PANEL
BUN: 36 mg/dL — ABNORMAL HIGH (ref 6–23)
CO2: 25 mEq/L (ref 19–32)
Calcium: 8.9 mg/dL (ref 8.4–10.5)
Chloride: 102 mEq/L (ref 96–112)
Creatinine, Ser: 2.49 mg/dL — ABNORMAL HIGH (ref 0.40–1.50)
GFR: 21.91 mL/min — ABNORMAL LOW (ref >60.00)
Glucose, Bld: 87 mg/dL (ref 70–99)
Potassium: 4.9 mEq/L (ref 3.5–5.1)
Sodium: 137 mEq/L (ref 135–145)

## 2021-12-12 MED ORDER — LOSARTAN POTASSIUM 100 MG PO TABS: 100.0000 mg | ORAL_TABLET | Freq: Every day | ORAL | 1 refills | Status: DC

## 2021-12-12 NOTE — Progress Notes (Signed)
? ?Established Patient Office Visit ? ?Subjective   ?Patient ID: [name], male    DOB: Dec 22, 1927  Age: 88 y.o. MRN: [id number] ? ?Chief Complaint  ?Patient presents with  ? Follow-up  ?  2 month follow up no concerns.   ? ? ?HPI follow-up of hypertension and CKD status post discontinuation of chlorthalidone with the addition of 20 mill furosemide.  Continues amlodipine and losartan.  Blood pressure at home has been running in the 140s over 80s.  He did see nephrology.  No changes were recommended at that visit.  Blood work is pending ? ? ? ?Review of Systems  ?Constitutional:  Negative for chills, diaphoresis, malaise/fatigue and weight loss.  ?HENT: Negative.    ?Eyes: Negative.  Negative for blurred vision and double vision.  ?Cardiovascular:  Negative for chest pain.  ?Gastrointestinal:  Negative for abdominal pain.  ?Genitourinary: Negative.   ?Musculoskeletal:  Negative for falls and myalgias.  ?Neurological:  Negative for speech change, loss of consciousness and weakness.  ?Psychiatric/Behavioral: Negative.    ? ?  ?Objective:  ?  ? ?BP (!) 146/70 (BP Location: Right Arm, Patient Position: Sitting, Cuff Size: Normal)   Pulse 67   Temp (!) 96.8 ?F (36 ?C) (Temporal)   Ht 5\' 10"  (1.778 m)   Wt 190 lb 9.6 oz (86.5 kg)   SpO2 97%   BMI 27.35 kg/m?  ? ? ?Physical Exam ?Constitutional:   ?   General: He is not in acute distress. ?   Appearance: Normal appearance. He is not ill-appearing, toxic-appearing or diaphoretic.  ?HENT:  ?   Head: Normocephalic and atraumatic.  ?   Right Ear: External ear normal.  ?   Left Ear: External ear normal.  ?Eyes:  ?   General: No scleral icterus.    ?   Right eye: No discharge.     ?   Left eye: No discharge.  ?   Extraocular Movements: Extraocular movements intact.  ?   Conjunctiva/sclera: Conjunctivae normal.  ?Cardiovascular:  ?   Rate and Rhythm: Normal rate and regular rhythm.  ?Pulmonary:  ?   Effort: Pulmonary effort is normal. No respiratory distress.  ?    Breath sounds: Examination of the right-lower field reveals rhonchi. Rhonchi present.  ?Abdominal:  ?   General: Bowel sounds are normal.  ?Skin: ?   General: Skin is warm and dry.  ?Neurological:  ?   Mental Status: He is alert and oriented to person, place, and time.  ?Psychiatric:     ?   Mood and Affect: Mood normal.     ?   Behavior: Behavior normal.  ? ? ? ?No results found for any visits on 12/12/21. ? ? ? ?The ASCVD Risk score (Arnett DK, et al., 2019) failed to calculate for the following reasons: ?  The 2019 ASCVD risk score is only valid for ages 41 to 13 ? ?  ?Assessment & Plan:  ? ?Problem List Items Addressed This Visit   ? ?  ? Cardiovascular and Mediastinum  ? Essential hypertension - Primary  ? Relevant Medications  ? losartan (COZAAR) 100 MG tablet  ? Other Relevant Orders  ? Basic metabolic panel  ?  ? Respiratory  ? 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  ? Genitourinary  ? Stage 3b chronic kidney disease ([place])  ? Relevant Medications  ? losartan (COZAAR) 100 MG tablet  ? Other Relevant Orders  ? Basic metabolic panel  ? ? ?Return in about  3 months (around 03/14/2022).  ? ?Blood pressure controlled well with amlodipine 10 mg, furosemide 20 mg and losartan 50 mg.  Have increased losartan to 100 mg.  We will continue follow-up with nephrology. ?Libby Maw, MD ? ?

## 2021-12-13 ENCOUNTER — Telehealth: Payer: Self-pay

## 2021-12-13 NOTE — Progress Notes (Signed)
Per [facility],dose increase for losartan was sent in for patient he currently has losartan 50mg  in his packs and his packs end 12/26/21. Do I need to hold to dose increase of 100mg  daily and start it in the new packs since they will start in 14 days?  Patient states his PCP wants him to start taking the 100 mg as soon as possible. Patient is asking which pill it is so he is able to identify it and remove the 50 mg from his packs when he gets the 100 mg until he gets his delivery. [facility]ified [facility].  [facility], I spoke with the patient. Patient was informed which tablet it is so he knows which one to take out. I am sending a partial of 13 tablets today to sync to packs.  [place] Pharmacist Assistant [phone]

## 2021-12-14 ENCOUNTER — Telehealth: Payer: Self-pay

## 2021-12-14 DIAGNOSIS — N184 Chronic kidney disease, stage 4 (severe): Secondary | ICD-10-CM

## 2021-12-14 DIAGNOSIS — N1832 Chronic kidney disease, stage 3b: Secondary | ICD-10-CM

## 2021-12-14 DIAGNOSIS — I10 Essential (primary) hypertension: Secondary | ICD-10-CM

## 2021-12-14 NOTE — Progress Notes (Signed)
Chronic Care Management Pharmacy Assistant   Name: [name]  MRN: [id number] DOB: [birth year]  Reason for Encounter: Medication Review/Medication Coordination Call.   Recent office visits:  12/12/2021 Dr. Ethelene Hal MD (PCP) Increase Losartan to 100 mg daily, Return in about 3 months   Recent consult visits:  No recent consult visit  Hospital visits:  None in previous 6 months  Medications: Outpatient Encounter Medications as of 12/14/2021  Medication Sig   acetaminophen (TYLENOL) 500 MG tablet Take 500 mg by mouth every 6 (six) hours as needed.   amLODipine (NORVASC) 10 MG tablet Take 1 tablet (10 mg total) by mouth every morning.   Carboxymethylcellulose Sod PF 0.5 % SOLN Place 1 drop into both eyes as needed (Dry Eyes).   Cholecalciferol (VITAMIN D3) 2000 units TABS Take 1 tablet by mouth daily.   clopidogrel (PLAVIX) 75 MG tablet TAKE ONE TABLET BY MOUTH EVERY MORNING   docusate sodium (COLACE) 100 MG capsule Take 1 capsule (100 mg total) by mouth 2 (two) times daily.   furosemide (LASIX) 20 MG tablet Take 1 tablet (20 mg total) by mouth daily.   gabapentin (NEURONTIN) 300 MG capsule Take 1 capsule (300 mg total) by mouth 3 (three) times daily. Take one capsule three times daily   losartan (COZAAR) 100 MG tablet Take 1 tablet (100 mg total) by mouth daily.   simvastatin (ZOCOR) 10 MG tablet TAKE ONE TABLET BY MOUTH EVERYDAY AT BEDTIME   vitamin B-12 (CYANOCOBALAMIN) 1000 MCG tablet Take 1,000 mcg by mouth daily.   vitamin C (ASCORBIC ACID) 500 MG tablet Take 500 mg by mouth daily.   No facility-administered encounter medications on file as of 12/14/2021.      Care Gaps: HTN: 146/70 - 12/12/2021   Star Rating Drugs: Simvastatin 25 mg last filled on 11/17/2021 for 30 day supply at [facility] Losartan 100 mg not on fill report but  50 mg last filled 11/17/2021 for 30 day supply at [facility].   Medication Fill Gaps: None  Reviewed chart for medication  changes ahead of medication coordination call.  BP Readings from Last 3 Encounters:  12/12/21 (!) 146/70  08/29/21 132/64  08/01/21 (!) 164/76    Lab Results  Component Value Date   HGBA1C 6.2 01/19/2021     Patient obtains medications through Adherence Packaging  30 Days   Last adherence delivery included:  Clopidogrel 75 mg 1 tablet daily - breakfast Gabapentin 300 mg Take one capsule each morning and 2 in the evening prior to bedtime.Patient states he want one tablet at Breakfast, one tablet at evening meals , and one tablet at bedtime. Simvastatin 10 mg 1 tablet daily - bedtime Vitamin D3 2000 units 1 tablet daily - breakfast Vitamin C 500 mg 1 tablet daily - breakfast Amlodipine 10 mg 1 tablet daily - breakfast  Losartan 50 mg 1 tablet daily -Breakfast Docusate sodium 100 mg 1 tablet  twice daily- breakfast, evening meal  Vitamin B12 1000 MG daily -Breakfast Furosemide 20 mg 1 tablet daily- Breakfast Systane Eye drops 0.6%- 1 drop into both eye PRN Sodium bicarbonate 650 mg one tablet two times daily -( Patient wants this in vials as he is confuse if he needs to take this long term.Patient states his nephrologist is going to run test regarding his kidneys and will inform him if he needs to continue it). Acetaminophen 500 mg PRN Fluticasone nasal spray 50 mcg 2 sprays in each nostrils every night  Patient declined medications  last month: Retaine Eye Drops 0.5% (Adequate supply) Saline Nose Solution - Adequate supply  Patient is due for next adherence delivery on: 12/25/2021. Called patient and reviewed medications and coordinated delivery.  This delivery to include: Clopidogrel 75 mg 1 tablet daily - breakfast Gabapentin 300 mg Take one capsule each morning and 2 in the evening prior to bedtime.Patient states he want one tablet at Breakfast, one tablet at evening meals , and one tablet at bedtime. Simvastatin 10 mg 1 tablet daily - bedtime Vitamin D3 2000 units 1 tablet  daily - breakfast Vitamin C 500 mg 1 tablet daily - breakfast Amlodipine 10 mg 1 tablet daily - breakfast  Losartan 100 mg 1 tablet daily -Breakfast Docusate sodium 100 mg 1 tablet  twice daily- breakfast, evening meal  Vitamin B12 1000 MG daily -Breakfast Furosemide 20 mg 1 tablet daily- Breakfast Systane Eye drops 0.6%- 1 drop into both eye PRN Fluticasone nasal spray 50 mcg 2 sprays in each nostrils every night   Patient declined the following medications: Sodium bicarbonate 650 mg one tablet two times daily - (patient states he has adequate supply on hand, and not sure if he suppose to continue taking this as he is going to follow up with his nephrologist). Acetaminophen 500 mg PRN Retaine Eye Drops 0.5% (Adequate supply) Saline Nose Solution - Adequate supply  Patient needs refills for Furosemide .  Confirmed delivery date of 12/25/2021 ([facility]), advised patient that pharmacy will contact them the morning of delivery.  Telephone follow up appointment with Care management team member scheduled for : 01/04/2022 at 3:00 pm.  Bessie Scurry Pharmacist Assistant [phone]

## 2021-12-18 MED ORDER — FUROSEMIDE 20 MG PO TABS: 20.0000 mg | ORAL_TABLET | Freq: Every day | ORAL | 3 refills | Status: DC

## 2021-12-18 NOTE — Addendum Note (Signed)
Addended by: Daron Offer A on: 12/18/2021 04:14 PM   Modules accepted: Orders

## 2022-01-02 ENCOUNTER — Telehealth: Payer: Self-pay

## 2022-01-02 NOTE — Progress Notes (Signed)
Chronic Care Management APPOINTMENT REMINDER   Called DAVONNE JARNIGAN, No answer, left message of appointment on 01/04/2022 at 3:00 pm via telephone visit with [name] , Pharm D. Notified to have all medications, supplements, blood pressure and/or blood sugar logs available during appointment and to return call if need to reschedule.     [place] Pharmacist Assistant [phone]

## 2022-01-04 ENCOUNTER — Ambulatory Visit (INDEPENDENT_AMBULATORY_CARE_PROVIDER_SITE_OTHER): Payer: Medicare HMO

## 2022-01-04 DIAGNOSIS — I10 Essential (primary) hypertension: Secondary | ICD-10-CM

## 2022-01-04 DIAGNOSIS — E7800 Pure hypercholesterolemia, unspecified: Secondary | ICD-10-CM

## 2022-01-04 NOTE — Progress Notes (Unsigned)
Chronic Care Management Pharmacy Note  01/04/2022 Name:  [name] MRN:  [id number] DOB:  1928/03/24  Subjective: [name] is an 88 y.o. year old male who is a primary patient of Libby Maw, MD.  The CCM team was consulted for assistance with disease management and care coordination needs.    Engaged with patient by telephone for follow up visit in response to provider referral for pharmacy case management and/or care coordination services.   Consent to Services:  The patient was given information about Chronic Care Management services, agreed to services, and gave verbal consent prior to initiation of services.  Please see initial visit note for detailed documentation.   Patient Care Team: Libby Maw, MD as PCP - General (Family Medicine) Germaine Pomfret, [facility] as Pharmacist (Pharmacist)  Recent office visits: 10/18/20: Patient presented to Dr. Ethelene Hal for follow-up following fall. BP 138/70. Patient dehydrated. No medication changes made.   Recent consult visits: None in past 6 months.   Hospital visits: 10/17/20: Patient presented to ED following fall and minor head injury.   Objective:  Lab Results  Component Value Date   CREATININE 2.49 (H) 12/12/2021   BUN 36 (H) 12/12/2021   GFR 21.91 (L) 12/12/2021   GFRNONAA 45 (L) 01/27/2019   GFRAA 52 (L) 01/27/2019   NA 137 12/12/2021   K 4.9 12/12/2021   CALCIUM 8.9 12/12/2021   CO2 25 12/12/2021   GLUCOSE 87 12/12/2021    Lab Results  Component Value Date/Time   HGBA1C 6.2 01/19/2021 11:18 AM   GFR 21.91 (L) 12/12/2021 03:09 PM   GFR 21.14 (L) 08/29/2021 01:44 PM   MICROALBUR 3.5 (H) 08/01/2021 03:09 PM   MICROALBUR 1.2 12/10/2017 10:43 AM    Last diabetic Eye exam: No results found for: "HMDIABEYEEXA"  Last diabetic Foot exam: No results found for: "HMDIABFOOTEX"   Lab Results  Component Value Date   CHOL 136 04/21/2021   HDL 38.90 (L) 04/21/2021   LDLCALC 68 04/21/2021    LDLDIRECT 88.0 05/15/2019   TRIG 142.0 04/21/2021   CHOLHDL 3 04/21/2021       Latest Ref Rng & Units 04/21/2021   11:26 AM 01/27/2021   10:27 AM 05/15/2019   11:37 AM  Hepatic Function  Total Protein 6.0 - 8.3 g/dL 7.3  7.1  6.7   Albumin 3.5 - 5.2 g/dL 4.2   4.0   AST 0 - 37 U/L 13   13   ALT 0 - 53 U/L 10   9   Alk Phosphatase 39 - 117 U/L 89   112   Total Bilirubin 0.2 - 1.2 mg/dL 0.7   0.6     Lab Results  Component Value Date/Time   TSH 2.650 04/04/2018 03:55 PM   TSH 2.92 06/27/2016 12:00 AM   TSH 1.52 01/09/2007 11:49 AM       Latest Ref Rng & Units 08/01/2021    3:09 PM 04/21/2021   11:26 AM 10/18/2020   11:07 AM  CBC  WBC 4.0 - 10.5 K/uL 9.1  9.1  8.6   Hemoglobin 13.0 - 17.0 g/dL 14.0  13.9  14.3   Hematocrit 39.0 - 52.0 % 43.8  43.2  43.5   Platelets 150.0 - 400.0 K/uL 257.0  286.0  229.0     Lab Results  Component Value Date/Time   VD25OH 52.59 04/21/2021 11:26 AM   VD25OH 49.42 10/18/2020 11:07 AM    Clinical ASCVD: Yes  The ASCVD Risk  score (Arnett DK, et al., 2019) failed to calculate for the following reasons:   The 2019 ASCVD risk score is only valid for ages 75 to 23       12/12/2021    2:36 PM 08/29/2021    1:07 PM 08/01/2021    2:25 PM  Depression screen PHQ 2/9  Decreased Interest 0 0 0  Down, Depressed, Hopeless 0 0 0  PHQ - 2 Score 0 0 0    Social History   Tobacco Use  Smoking Status Never  Smokeless Tobacco Never   BP Readings from Last 3 Encounters:  12/12/21 (!) 146/70  08/29/21 132/64  08/01/21 (!) 164/76   Pulse Readings from Last 3 Encounters:  12/12/21 67  08/29/21 62  08/01/21 64   Wt Readings from Last 3 Encounters:  12/12/21 190 lb 9.6 oz (86.5 kg)  08/29/21 193 lb (87.5 kg)  08/01/21 182 lb 12.8 oz (82.9 kg)   BMI Readings from Last 3 Encounters:  12/12/21 27.35 kg/m  08/29/21 27.69 kg/m  08/01/21 26.23 kg/m    Assessment/Interventions: Review of patient past medical history, allergies, medications,  health status, including review of consultants reports, laboratory and other test data, was performed as part of comprehensive evaluation and provision of chronic care management services.   SDOH:  (Social Determinants of Health) assessments and interventions performed: Yes   SDOH Screenings   Alcohol Screen: Not on file  Depression (PHQ2-9): Low Risk  (12/12/2021)   Depression (PHQ2-9)    PHQ-2 Score: 0  Financial Resource Strain: Not on file  Food Insecurity: Not on file  Housing: Not on file  Physical Activity: Not on file  Social Connections: Not on file  Stress: Not on file  Tobacco Use: Low Risk  (12/12/2021)   Patient History    Smoking Tobacco Use: Never    Smokeless Tobacco Use: Never    Passive Exposure: Not on file  Transportation Needs: Not on file    [place]  Allergies  Allergen Reactions   Clonidine     Other reaction(s): Other (See Comments) Dry mouth and ineffective per patient.   Codeine Other (See Comments)    No reaction noted   Lisinopril     hyperkalemia   Penicillins     REACTION: rash    Medications Reviewed Today     Reviewed by Libby Maw, MD (Physician) on 12/12/21 at [place] List Status: <None>   Medication Order Taking? Sig Documenting Provider Last Dose Status Informant  acetaminophen (TYLENOL) 500 MG tablet [id number] Yes Take 500 mg by mouth every 6 (six) hours as needed. [provider] Taking Active   amLODipine (NORVASC) 10 MG tablet [id number] Yes Take 1 tablet (10 mg total) by mouth every morning. Libby Maw, MD Taking Active   Carboxymethylcellulose Sod PF 0.5 % SOLN [id number] Yes Place 1 drop into both eyes as needed (Dry Eyes). [provider] Taking Active   Cholecalciferol (VITAMIN D3) 2000 units TABS [id number] Yes Take 1 tablet by mouth daily. [provider] Taking Active   clopidogrel (PLAVIX) 75 MG tablet [id number] Yes TAKE ONE TABLET BY MOUTH EVERY MORNING Libby Maw, MD Taking Active   docusate sodium (COLACE) 100 MG capsule [id number] Yes Take 1 capsule (100 mg total) by mouth 2 (two) times daily. Libby Maw, MD Taking Active   furosemide (LASIX) 20 MG tablet [id number] Yes Take 1 tablet (20 mg total) by mouth daily. Libby Maw, MD Taking  Active   gabapentin (NEURONTIN) 300 MG capsule [id number] Yes Take 1 capsule (300 mg total) by mouth 3 (three) times daily. Take one capsule three times daily Libby Maw, MD Taking Active   losartan (COZAAR) 100 MG tablet [id number] Yes Take 1 tablet (100 mg total) by mouth daily. Libby Maw, MD  Active   simvastatin (ZOCOR) 10 MG tablet [id number] Yes TAKE ONE TABLET BY MOUTH EVERYDAY AT BEDTIME Libby Maw, MD Taking Active   vitamin B-12 (CYANOCOBALAMIN) 1000 MCG tablet [id number] Yes Take 1,000 mcg by mouth daily. [provider] Taking Active   vitamin C (ASCORBIC ACID) 500 MG tablet [id number] Yes Take 500 mg by mouth daily. [provider] Taking Active             Patient Active Problem List   Diagnosis Date Noted   Stage 3b chronic kidney disease ([place]) 08/01/2021   Elevated glucose 01/19/2021   Vitamin B 12 deficiency 01/19/2021   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   Fall 10/18/2020   Fibrotic lung diseases ([place]) 10/18/2020   Slow transit constipation 04/05/2020   Near syncope 12/24/2019   Abnormal CT of the chest 05/22/2019   Vitamin D deficiency 05/22/2019   Edema 01/27/2019   Bilateral rales 01/27/2019   Weight gain 01/01/2019   Elevated cholesterol 05/19/2018   Stage 3a chronic kidney disease ([place]) 04/17/2018   Hyperkalemia 04/04/2018   Bradycardia 03/17/2018   PND (post-nasal drip) 12/10/2017   Neuropathy 12/05/2017   Transient cerebral ischemia 10/06/2007   Carotid disease, bilateral ([place]) 01/27/2007   PAIN IN JOINT, MULTIPLE SITES 01/09/2007   BRANCH RETINAL VEIN OCCLUSION 11/19/2006   Hearing loss 11/19/2006    Essential hypertension 11/19/2006   PEPTIC ULCER DISEASE 11/19/2006   HEADACHE 11/19/2006    Immunization History  Administered Date(s) Administered   Fluad Quad(high Dose 65+) 04/15/2019   Influenza Whole 06/19/2007   Influenza, High Dose Seasonal PF 05/02/2018   Influenza-Unspecified 05/13/2017, 05/28/2020, 06/05/2021   Moderna Sars-Covid-2 Vaccination 09/30/2019, 10/28/2019, 05/28/2020, 06/05/2021   Pneumococcal Conjugate-13 07/31/2015   Pneumococcal Polysaccharide-23 07/31/2015, 10/24/2016   Zoster Recombinat (Shingrix) 04/26/2020, 07/29/2020    Conditions to be addressed/monitored:  Hypertension, Hyperlipidemia, Chronic Kidney Disease and History of TIA   There are no care plans that you recently modified to display for this patient.     Medication Assistance: None required.  Patient affirms current coverage meets needs.  Patient's preferred pharmacy is:  [facility] - [place], [place] - [address] [place] [address] Phone: [phone] Fax: [phone]  [id number] - [place], [place] - [address] [place] [address] Phone: [phone] Fax: [phone]  Patient decided to: Utilize [facility] for medication synchronization, packaging and delivery   Care Plan and Follow Up Patient Decision:  Patient agrees to Care Plan and Follow-up.  Plan: Telephone follow up appointment with care management team member scheduled for:  04/24/2021 at 11:00 AM  [name], PharmD, CPP Clinical Pharmacist [facility]  [phone]  Current Barriers:  No Barriers Noted  Pharmacist Clinical Goal(s):  Patient will maintain control of blood pressure as evidenced by BP less than 140/90  through collaboration with PharmD and provider.   Interventions: 1:1 collaboration with Libby Maw, MD regarding  development and update of comprehensive plan of care as evidenced by provider attestation and co-signature Inter-disciplinary care  team collaboration (see longitudinal plan of care) Comprehensive medication review performed; medication list updated in electronic medical record  Hypertension (BP goal <140/90) -Controlled -Current treatment: Amlodipine 10 mg daily Furosemide 20 mg daily   Losartan 100 mg daily  -Medications previously tried: NA  -Current home readings: 470-761H systolic -Current dietary habits: Tries to minimize salt intake -Current exercise habits: Tries to walk daily -Denies hypotensive/hypertensive symptoms -Recommended to continue current medication   Hyperlipidemia: (LDL goal < 100) -Controlled -Current treatment: Simvastatin 10 mg daily  -Current antiplatelet treatment: Clopidogrel 75 mg daily   -Medications previously tried: NA  -Educated on Importance of limiting foods high in cholesterol; -Recommended to continue current medication  Chronic Kidney Disease Stage 4  -All medications assessed for renal dosing and appropriateness in chronic kidney disease. -Recommended to continue current medication   Patient Goals/Self-Care Activities Patient will:  - check blood pressure weekly, document, and provide at future appointments increase water intake (goal 60 oz daily)  Follow Up Plan: ***

## 2022-01-08 ENCOUNTER — Telehealth: Payer: Self-pay

## 2022-01-08 NOTE — Progress Notes (Cosign Needed)
Per Clinical Pharmacist, Please submit an order for-  Saline Nasal spray as needed  Refresh gel drops Lokelma packs twice weekly - Reach out to Dr. Candiss Norse with nephrology for these  I reach out to Dr. Candiss Norse to request a refill for [facility]/2022.I had to leave a voice message requesting refill.Per voice mail it can take 24-48 hours to respond.   I completed acute form for medications to be deliver on 01/10/2022.  [place] Pharmacist Assistant [phone]

## 2022-01-11 ENCOUNTER — Telehealth: Payer: Self-pay

## 2022-01-11 NOTE — Progress Notes (Cosign Needed)
Chronic Care Management Pharmacy Assistant   Name: [name]  MRN: [id number] DOB: [birth year]  Reason for Encounter: Medication Review/Medication coordination call.   Recent office visits:  No recent office visit  Recent consult visits:  No recent consult visit  Hospital visits:  None in previous 6 months  Medications: Outpatient Encounter Medications as of 01/11/2022  Medication Sig   acetaminophen (TYLENOL) 500 MG tablet Take 500 mg by mouth every 6 (six) hours as needed.   amLODipine (NORVASC) 10 MG tablet Take 1 tablet (10 mg total) by mouth every morning.   Carboxymethylcellulose Sod PF 0.5 % SOLN Place 1 drop into both eyes as needed (Dry Eyes).   Cholecalciferol (VITAMIN D3) 2000 units TABS Take 1 tablet by mouth daily.   clopidogrel (PLAVIX) 75 MG tablet TAKE ONE TABLET BY MOUTH EVERY MORNING   docusate sodium (COLACE) 100 MG capsule Take 1 capsule (100 mg total) by mouth 2 (two) times daily.   furosemide (LASIX) 20 MG tablet Take 1 tablet (20 mg total) by mouth daily.   gabapentin (NEURONTIN) 300 MG capsule Take 1 capsule (300 mg total) by mouth 3 (three) times daily. Take one capsule three times daily   losartan (COZAAR) 100 MG tablet Take 1 tablet (100 mg total) by mouth daily.   simvastatin (ZOCOR) 10 MG tablet TAKE ONE TABLET BY MOUTH EVERYDAY AT BEDTIME   sodium zirconium cyclosilicate (LOKELMA) 10 g PACK packet Take 10 g by mouth 2 (two) times a week.   vitamin B-12 (CYANOCOBALAMIN) 1000 MCG tablet Take 1,000 mcg by mouth daily.   vitamin C (ASCORBIC ACID) 500 MG tablet Take 500 mg by mouth daily.   No facility-administered encounter medications on file as of 01/11/2022.    Care Gaps: HTN: 146/70 - 12/12/2021   Star Rating Drugs: Simvastatin 25 mg last filled on 12/19/2021 for 30 day supply at [facility] Losartan 100 mg not on fill report but  50 mg last filled 12/22/2021 for 30 day supply at [facility].   Medication Fill  Gaps: None  Reviewed chart for medication changes ahead of medication coordination call.  BP Readings from Last 3 Encounters:  12/12/21 (!) 146/70  08/29/21 132/64  08/01/21 (!) 164/76    Lab Results  Component Value Date   HGBA1C 6.2 01/19/2021     Patient obtains medications through Adherence Packaging  30 Days   Last adherence delivery included:  Clopidogrel 75 mg 1 tablet daily - breakfast Gabapentin 300 mg Take one capsule each morning and 2 in the evening prior to bedtime.Patient states he want one tablet at Breakfast, one tablet at evening meals , and one tablet at bedtime. Simvastatin 10 mg 1 tablet daily - bedtime Vitamin D3 2000 units 1 tablet daily - breakfast Vitamin C 500 mg 1 tablet daily - breakfast Amlodipine 10 mg 1 tablet daily - breakfast  Losartan 100 mg 1 tablet daily -Breakfast Docusate sodium 100 mg 1 tablet  twice daily- breakfast, evening meal  Vitamin B12 1000 MG daily -Breakfast Furosemide 20 mg 1 tablet daily- Breakfast Systane Eye drops 0.6%- 1 drop into both eye PRN Fluticasone nasal spray 50 mcg 2 sprays in each nostrils every night  Patient declined medications last month: Sodium bicarbonate 650 mg one tablet two times daily - (patient states he has adequate supply on hand, and not sure if he suppose to continue taking this as he is going to follow up with his nephrologist). Acetaminophen 500 mg PRN Retaine Eye Drops  0.5% (Adequate supply) Saline Nose Solution - Adequate supply  Patient is due for next adherence delivery on: 01/23/2022. Called patient and reviewed medications and coordinated delivery.  This delivery to include: Clopidogrel 75 mg 1 tablet daily - breakfast Gabapentin 300 mg Take one capsule each morning and 2 in the evening prior to bedtime.Patient states he want one tablet at Breakfast, one tablet at evening meals , and one tablet at bedtime. Simvastatin 10 mg 1 tablet daily - bedtime Vitamin D3 2000 units 1 tablet daily -  breakfast Vitamin C 500 mg 1 tablet daily - breakfast Amlodipine 10 mg 1 tablet daily - breakfast  Losartan 100 mg 1 tablet daily -Breakfast Docusate sodium 100 mg 1 tablet  twice daily- breakfast, evening meal  Vitamin B12 1000 MG daily -Breakfast Furosemide 20 mg 1 tablet daily- Breakfast Systane Eye drops 0.6%- 1 drop into both eye PRN Sodium bicarbonate 650 mg one tablet two times daily- breakfast, evening meals Lokelma 10 g - one packet twice weekly  Patient declined the following medications: Acetaminophen 500 mg PRN Retaine Eye Drops 0.5% (Adequate supply) Saline Nose Solution - Adequate supply Fluticasone nasal spray 50 mcg 2 sprays in each nostrils every night  Patient needs refills for None ID.  Confirmed delivery date of 01/23/2022 ([facility]), advised patient that pharmacy will contact them the morning of delivery.  [place] Pharmacist Assistant [phone]

## 2022-01-18 DIAGNOSIS — H7292 Unspecified perforation of tympanic membrane, left ear: Secondary | ICD-10-CM | POA: Diagnosis not present

## 2022-01-18 DIAGNOSIS — H9212 Otorrhea, left ear: Secondary | ICD-10-CM | POA: Diagnosis not present

## 2022-01-26 DIAGNOSIS — E785 Hyperlipidemia, unspecified: Secondary | ICD-10-CM | POA: Diagnosis not present

## 2022-01-26 DIAGNOSIS — N184 Chronic kidney disease, stage 4 (severe): Secondary | ICD-10-CM | POA: Diagnosis not present

## 2022-01-26 DIAGNOSIS — I129 Hypertensive chronic kidney disease with stage 1 through stage 4 chronic kidney disease, or unspecified chronic kidney disease: Secondary | ICD-10-CM

## 2022-02-05 DIAGNOSIS — N184 Chronic kidney disease, stage 4 (severe): Secondary | ICD-10-CM | POA: Diagnosis not present

## 2022-02-07 DIAGNOSIS — D631 Anemia in chronic kidney disease: Secondary | ICD-10-CM | POA: Diagnosis not present

## 2022-02-07 DIAGNOSIS — N2581 Secondary hyperparathyroidism of renal origin: Secondary | ICD-10-CM | POA: Diagnosis not present

## 2022-02-07 DIAGNOSIS — E872 Acidosis, unspecified: Secondary | ICD-10-CM | POA: Diagnosis not present

## 2022-02-07 DIAGNOSIS — E875 Hyperkalemia: Secondary | ICD-10-CM | POA: Diagnosis not present

## 2022-02-07 DIAGNOSIS — N184 Chronic kidney disease, stage 4 (severe): Secondary | ICD-10-CM | POA: Diagnosis not present

## 2022-02-07 DIAGNOSIS — I129 Hypertensive chronic kidney disease with stage 1 through stage 4 chronic kidney disease, or unspecified chronic kidney disease: Secondary | ICD-10-CM | POA: Diagnosis not present

## 2022-02-12 ENCOUNTER — Telehealth: Payer: Self-pay

## 2022-02-12 NOTE — Progress Notes (Signed)
Chronic Care Management Pharmacy Assistant   Name: [name]  MRN: [id number] DOB: Apr 30, 1928   Reason for Encounter: Medication Review/Medication Coordination Call.   Recent office visits:  No recent office visit  Recent consult visits:  01/18/2022 Pietro Cassis PA-C (Otolaryngology) Start Ciprofloxacin ophthalmic solution twice daily left ear x10 days  Hospital visits:  None in previous 6 months  Medications: Outpatient Encounter Medications as of 02/12/2022  Medication Sig   acetaminophen (TYLENOL) 500 MG tablet Take 500 mg by mouth every 6 (six) hours as needed.   amLODipine (NORVASC) 10 MG tablet Take 1 tablet (10 mg total) by mouth every morning.   Carboxymethylcellulose Sod PF 0.5 % SOLN Place 1 drop into both eyes as needed (Dry Eyes).   Cholecalciferol (VITAMIN D3) 2000 units TABS Take 1 tablet by mouth daily.   clopidogrel (PLAVIX) 75 MG tablet TAKE ONE TABLET BY MOUTH EVERY MORNING   docusate sodium (COLACE) 100 MG capsule Take 1 capsule (100 mg total) by mouth 2 (two) times daily.   furosemide (LASIX) 20 MG tablet Take 1 tablet (20 mg total) by mouth daily.   gabapentin (NEURONTIN) 300 MG capsule Take 1 capsule (300 mg total) by mouth 3 (three) times daily. Take one capsule three times daily   losartan (COZAAR) 100 MG tablet Take 1 tablet (100 mg total) by mouth daily.   simvastatin (ZOCOR) 10 MG tablet TAKE ONE TABLET BY MOUTH EVERYDAY AT BEDTIME   sodium zirconium cyclosilicate (LOKELMA) 10 g PACK packet Take 10 g by mouth 2 (two) times a week.   vitamin B-12 (CYANOCOBALAMIN) 1000 MCG tablet Take 1,000 mcg by mouth daily.   vitamin C (ASCORBIC ACID) 500 MG tablet Take 500 mg by mouth daily.   No facility-administered encounter medications on file as of 02/12/2022.    Care Gaps: HTN: 146/70 - 12/12/2021   Star Rating Drugs: Simvastatin 25 mg last filled on 12/19/2021 for 30 day supply at [facility] Losartan 100 mg not on fill report but  50 mg  last filled 12/22/2021 for 30 day supply at [facility].   Medication Fill Gaps: None  Reviewed chart for medication changes ahead of medication coordination call.  BP Readings from Last 3 Encounters:  12/12/21 (!) 146/70  08/29/21 132/64  08/01/21 (!) 164/76    Lab Results  Component Value Date   HGBA1C 6.2 01/19/2021     Patient obtains medications through Adherence Packaging  30 Days   Last adherence delivery included:  Clopidogrel 75 mg 1 tablet daily - breakfast Gabapentin 300 mg Take one capsule each morning and 2 in the evening prior to bedtime.Patient states he want one tablet at Breakfast, one tablet at evening meals , and one tablet at bedtime. Simvastatin 10 mg 1 tablet daily - bedtime Vitamin D3 2000 units 1 tablet daily - breakfast Vitamin C 500 mg 1 tablet daily - breakfast Amlodipine 10 mg 1 tablet daily - breakfast  Losartan 100 mg 1 tablet daily -Breakfast Docusate sodium 100 mg 1 tablet  twice daily- breakfast, evening meal  Vitamin B12 1000 MG daily -Breakfast Furosemide 20 mg 1 tablet daily- Breakfast Systane Eye drops 0.6%- 1 drop into both eye PRN Sodium bicarbonate 650 mg one tablet two times daily- breakfast, evening meals Lokelma 10 g - one packet twice weekly  Patient declined medication last month  Acetaminophen 500 mg PRN Retaine Eye Drops 0.5% (Adequate supply) Saline Nose Solution - Adequate supply Fluticasone nasal spray 50 mcg 2 sprays in  each nostrils every night   Patient is due for next adherence delivery on: 02/22/2022. Called patient and reviewed medications and coordinated delivery.  This delivery to include: Clopidogrel 75 mg 1 tablet daily - breakfast Gabapentin 300 mg Take one capsule each morning and 2 in the evening prior to bedtime.Patient states he want one tablet at Breakfast, one tablet at evening meals , and one tablet at bedtime. Simvastatin 10 mg 1 tablet daily - bedtime Vitamin D3 2000 units 1 tablet daily -  breakfast Vitamin C 500 mg 1 tablet daily - breakfast Amlodipine 10 mg 1 tablet daily - breakfast  Losartan 100 mg 1 tablet daily -Breakfast Docusate sodium 100 mg 1 tablet  twice daily- breakfast, evening meal  Vitamin B12 1000 MG daily -Breakfast Furosemide 20 mg 1 tablet daily- Breakfast Systane Eye drops 0.6%- 1 drop into both eye PRN Sodium bicarbonate 650 mg one tablet two times daily- breakfast, evening meals Lokelma 10 g - one packet twice weekly  Patient declined the following medications: Acetaminophen 500 mg PRN Retaine Eye Drops 0.5% (Adequate supply) Saline Nose Solution - Adequate supply Fluticasone nasal spray 50 mcg 2 sprays in each nostrils every night   Patient needs refills for None ID.  Confirmed delivery date of 02/22/2022 ([facility]), advised patient that pharmacy will contact them the morning of delivery.  [place] Pharmacist Assistant [phone]

## 2022-03-13 ENCOUNTER — Ambulatory Visit (INDEPENDENT_AMBULATORY_CARE_PROVIDER_SITE_OTHER): Payer: Medicare HMO | Admitting: Family Medicine

## 2022-03-13 ENCOUNTER — Telehealth: Payer: Self-pay

## 2022-03-13 ENCOUNTER — Encounter: Payer: Self-pay | Admitting: Family Medicine

## 2022-03-13 VITALS — BP 112/64 | HR 59 | Temp 97.7°F | Ht 70.0 in | Wt 192.0 lb

## 2022-03-13 DIAGNOSIS — R5383 Other fatigue: Secondary | ICD-10-CM | POA: Diagnosis not present

## 2022-03-13 DIAGNOSIS — I10 Essential (primary) hypertension: Secondary | ICD-10-CM | POA: Diagnosis not present

## 2022-03-13 DIAGNOSIS — E7800 Pure hypercholesterolemia, unspecified: Secondary | ICD-10-CM | POA: Diagnosis not present

## 2022-03-13 DIAGNOSIS — N184 Chronic kidney disease, stage 4 (severe): Secondary | ICD-10-CM | POA: Diagnosis not present

## 2022-03-13 HISTORY — DX: Other fatigue: R53.83

## 2022-03-13 LAB — BASIC METABOLIC PANEL
BUN: 45 mg/dL — ABNORMAL HIGH (ref 6–23)
CO2: 21 mEq/L (ref 19–32)
Calcium: 8.6 mg/dL (ref 8.4–10.5)
Chloride: 104 mEq/L (ref 96–112)
Creatinine, Ser: 2.64 mg/dL — ABNORMAL HIGH (ref 0.40–1.50)
GFR: 20.39 mL/min — ABNORMAL LOW (ref >60.00)
Glucose, Bld: 84 mg/dL (ref 70–99)
Potassium: 4.7 mEq/L (ref 3.5–5.1)
Sodium: 137 mEq/L (ref 135–145)

## 2022-03-13 LAB — URINALYSIS, ROUTINE W REFLEX MICROSCOPIC
Bilirubin Urine: NEGATIVE
Hgb urine dipstick: NEGATIVE
Nitrite: NEGATIVE
RBC / HPF: NONE SEEN (ref 0–?)
Specific Gravity, Urine: 1.02 (ref 1.000–1.030)
Total Protein, Urine: NEGATIVE
Urine Glucose: NEGATIVE
Urobilinogen, UA: 0.2 (ref 0.0–1.0)
pH: 6 (ref 5.0–8.0)

## 2022-03-13 LAB — LDL CHOLESTEROL, DIRECT: Direct LDL: 78 mg/dL

## 2022-03-13 LAB — CBC
HCT: 39.5 % (ref 39.0–52.0)
Hemoglobin: 12.3 g/dL — ABNORMAL LOW (ref 13.0–17.0)
MCHC: 31.2 g/dL (ref 30.0–36.0)
MCV: 85.2 fl (ref 78.0–100.0)
Platelets: 258 10*3/uL (ref 150.0–400.0)
RBC: 4.63 Mil/uL (ref 4.22–5.81)
RDW: 14.8 % (ref 11.5–15.5)
WBC: 9.9 10*3/uL (ref 4.0–10.5)

## 2022-03-13 LAB — TSH: TSH: 3.03 u[IU]/mL (ref 0.35–5.50)

## 2022-03-13 NOTE — Progress Notes (Signed)
Chronic Care Management Pharmacy Assistant   Name: [name]  MRN: [id number] DOB: 08/01/1927  Reason for Encounter: Medication Review/Medication Coordination Call.   Recent office visits:  No recent office visit  Recent consult visits:  No recent consult visit  Hospital visits:  None in previous 6 months  Medications: Outpatient Encounter Medications as of 03/13/2022  Medication Sig   acetaminophen (TYLENOL) 500 MG tablet Take 500 mg by mouth every 6 (six) hours as needed.   amLODipine (NORVASC) 10 MG tablet Take 1 tablet (10 mg total) by mouth every morning.   Carboxymethylcellulose Sod PF 0.5 % SOLN Place 1 drop into both eyes as needed (Dry Eyes).   Cholecalciferol (VITAMIN D3) 2000 units TABS Take 1 tablet by mouth daily.   clopidogrel (PLAVIX) 75 MG tablet TAKE ONE TABLET BY MOUTH EVERY MORNING   docusate sodium (COLACE) 100 MG capsule Take 1 capsule (100 mg total) by mouth 2 (two) times daily.   furosemide (LASIX) 20 MG tablet Take 1 tablet (20 mg total) by mouth daily.   gabapentin (NEURONTIN) 300 MG capsule Take 1 capsule (300 mg total) by mouth 3 (three) times daily. Take one capsule three times daily   losartan (COZAAR) 100 MG tablet Take 1 tablet (100 mg total) by mouth daily.   simvastatin (ZOCOR) 10 MG tablet TAKE ONE TABLET BY MOUTH EVERYDAY AT BEDTIME   sodium zirconium cyclosilicate (LOKELMA) 10 g PACK packet Take 10 g by mouth 2 (two) times a week.   vitamin B-12 (CYANOCOBALAMIN) 1000 MCG tablet Take 1,000 mcg by mouth daily.   vitamin C (ASCORBIC ACID) 500 MG tablet Take 500 mg by mouth daily.   No facility-administered encounter medications on file as of 03/13/2022.    Care Gaps: HTN: 146/70 - 12/12/2021   Star Rating Drugs: Simvastatin 25 mg last filled on 02/19/2022 for 30 day supply at [facility] Losartan 100 mg not on fill report but  50 mg last filled 02/19/2022 for 30 day supply at [facility].   Medication Fill  Gaps: None  Reviewed chart for medication changes ahead of medication coordination call.   BP Readings from Last 3 Encounters:  12/12/21 (!) 146/70  08/29/21 132/64  08/01/21 (!) 164/76    Lab Results  Component Value Date   HGBA1C 6.2 01/19/2021     Patient obtains medications through Adherence Packaging  30 Days   Last adherence delivery included:  Clopidogrel 75 mg 1 tablet daily - breakfast Gabapentin 300 mg Take one capsule each morning and 2 in the evening prior to bedtime.Patient states he want one tablet at Breakfast, one tablet at evening meals , and one tablet at bedtime. Simvastatin 10 mg 1 tablet daily - bedtime Vitamin D3 2000 units 1 tablet daily - breakfast Vitamin C 500 mg 1 tablet daily - breakfast Amlodipine 10 mg 1 tablet daily - breakfast  Losartan 100 mg 1 tablet daily -Breakfast Docusate sodium 100 mg 1 tablet  twice daily- breakfast, evening meal  Vitamin B12 1000 MG daily -Breakfast Furosemide 20 mg 1 tablet daily- Breakfast Systane Eye drops 0.6%- 1 drop into both eye PRN Sodium bicarbonate 650 mg one tablet two times daily- breakfast, evening meals Lokelma 10 g - one packet twice weekly  Patient declined Medications last month  Acetaminophen 500 mg PRN Retaine Eye Drops 0.5% (Adequate supply) Saline Nose Solution - Adequate supply Fluticasone nasal spray 50 mcg 2 sprays in each nostrils every night  Patient is due for next adherence  delivery on: 03/23/2022. Called patient and reviewed medications and coordinated delivery.  This delivery to include: Clopidogrel 75 mg 1 tablet daily - breakfast Simvastatin 10 mg 1 tablet daily - bedtime Vitamin D3 2000 units 1 tablet daily - breakfast Vitamin C 500 mg 1 tablet daily - breakfast Amlodipine 10 mg 1 tablet daily - breakfast  Losartan 100 mg 1 tablet daily -Breakfast Docusate sodium 100 mg 1 tablet  twice daily- breakfast, evening meal  Vitamin B12 1000 MG daily -Breakfast Furosemide 20 mg 1  tablet daily- Breakfast Systane Eye drops 0.6%- 1 drop into both eye PRN Sodium bicarbonate 650 mg one tablet two times daily- breakfast, evening meals  Patient declined the following medications Gabapentin 300 mg Take one capsule each morning and 2 in the evening prior to bedtime.Patient states he want one tablet at Breakfast, one tablet at evening meals , and one tablet at bedtime.(Patient would like to get this filled at [facility] in [place] because it is cheaper).Notified [facility]. Lokelma 10 g - one packet twice weekly (Patient would like to get this filled at [facility] in [place] because it is cheaper).Notified [facility]. Acetaminophen 500 mg PRN Retaine Eye Drops 0.5% (Adequate supply) Saline Nose Solution - Adequate supply Fluticasone nasal spray 50 mcg 2 sprays in each nostrils every night  Patient needs refills for None ID.  Confirmed delivery date of 03/23/2022 ([facility]), advised patient that pharmacy will contact them the morning of delivery.  [place] Pharmacist Assistant [phone]

## 2022-03-13 NOTE — Progress Notes (Signed)
Established Patient Office Visit  Subjective   Patient ID: [name], male    DOB: [birth year]  Age: 88 y.o. MRN: [id number]  Chief Complaint  Patient presents with   Follow-up    3 month follow up, no concerns. Patient not fasting.     HPI for follow-up hypertension, CKD, elevated cholesterol.  He has noticed decrease in his energy levels.  Continues to do what he is always done.  He exercises by walking around his house.  He does not go outside because of the heat and air quality.  He did see nephrology who continued him on his current meds and added Lokelma.  Weight and appetite have been stable    Review of Systems  Constitutional:  Positive for malaise/fatigue. Negative for diaphoresis and weight loss.  HENT: Negative.    Eyes:  Negative for blurred vision, discharge and redness.  Respiratory: Negative.    Cardiovascular: Negative.   Gastrointestinal:  Negative for abdominal pain.  Genitourinary: Negative.   Musculoskeletal: Negative.  Negative for myalgias.  Skin:  Negative for rash.  Neurological:  Negative for tingling, loss of consciousness and weakness.  Endo/Heme/Allergies:  Negative for polydipsia.      Objective:     BP 112/64 (BP Location: Right Arm, Patient Position: Sitting, Cuff Size: Normal)   Pulse (!) 59   Temp 97.7 F (36.5 C) (Temporal)   Ht 5\' 10"  (1.778 m)   Wt 192 lb (87.1 kg)   SpO2 95%   BMI 27.55 kg/m  Wt Readings from Last 3 Encounters:  03/13/22 192 lb (87.1 kg)  12/12/21 190 lb 9.6 oz (86.5 kg)  08/29/21 193 lb (87.5 kg)      Physical Exam Constitutional:      General: He is not in acute distress.    Appearance: Normal appearance. He is not ill-appearing, toxic-appearing or diaphoretic.  HENT:     Head: Normocephalic and atraumatic.     Right Ear: External ear normal.     Left Ear: External ear normal.     Mouth/Throat:     Pharynx: No posterior oropharyngeal erythema.  Eyes:     General: No scleral icterus.        Right eye: No discharge.        Left eye: No discharge.     Extraocular Movements: Extraocular movements intact.     Conjunctiva/sclera: Conjunctivae normal.  Pulmonary:     Effort: Pulmonary effort is normal. No respiratory distress.  Abdominal:     Tenderness: There is no guarding.  Musculoskeletal:     Cervical back: No tenderness.  Skin:    General: Skin is warm and dry.  Neurological:     Mental Status: He is alert and oriented to person, place, and time.  Psychiatric:        Mood and Affect: Mood normal.        Behavior: Behavior normal.      No results found for any visits on 03/13/22.    The ASCVD Risk score (Arnett DK, et al., 2019) failed to calculate for the following reasons:   The 2019 ASCVD risk score is only valid for ages 85 to 79    Assessment & Plan:   Problem List Items Addressed This Visit       Cardiovascular and Mediastinum   Essential hypertension - Primary   Relevant Orders   Basic metabolic panel   Urinalysis, Routine w reflex microscopic     Genitourinary   Stage 4  chronic kidney disease ([place])   Relevant Orders   Basic metabolic panel   Urinalysis, Routine w reflex microscopic     Other   Elevated cholesterol   Relevant Orders   LDL cholesterol, direct   Other fatigue   Relevant Orders   TSH   CBC   Urinalysis, Routine w reflex microscopic    Return in about 3 months (around 06/13/2022).  Continue current medications.  Further recommendations made on above lab work results.  Libby Maw, MD

## 2022-03-26 DIAGNOSIS — H5203 Hypermetropia, bilateral: Secondary | ICD-10-CM | POA: Diagnosis not present

## 2022-04-10 ENCOUNTER — Ambulatory Visit (INDEPENDENT_AMBULATORY_CARE_PROVIDER_SITE_OTHER): Payer: Medicare HMO

## 2022-04-10 ENCOUNTER — Other Ambulatory Visit: Payer: Self-pay | Admitting: Family Medicine

## 2022-04-10 VITALS — Ht 68.0 in | Wt 180.0 lb

## 2022-04-10 DIAGNOSIS — Z0000 Encounter for general adult medical examination without abnormal findings: Secondary | ICD-10-CM | POA: Diagnosis not present

## 2022-04-10 DIAGNOSIS — G459 Transient cerebral ischemic attack, unspecified: Secondary | ICD-10-CM

## 2022-04-10 DIAGNOSIS — N184 Chronic kidney disease, stage 4 (severe): Secondary | ICD-10-CM

## 2022-04-10 DIAGNOSIS — I10 Essential (primary) hypertension: Secondary | ICD-10-CM

## 2022-04-10 NOTE — Patient Instructions (Signed)
[name] , Thank you for taking time to come for your Medicare Wellness Visit. I appreciate your ongoing commitment to your health goals. Please review the following plan we discussed and let me know if I can assist you in the future.   Screening recommendations/referrals: Colonoscopy: no longer required  Recommended yearly ophthalmology/optometry visit for glaucoma screening and checkup Recommended yearly dental visit for hygiene and checkup  Vaccinations: Influenza vaccine: due in the fall  Pneumococcal vaccine: completed  Tdap vaccine: due  Shingles vaccine: completed    Covid-19: completed   Advanced directives: Please bring a copy of your health care power of attorney and living will to the office to be added to your chart at your convenience.   Conditions/risks identified: Aim for 30 minutes of exercise or brisk walking, 6-8 glasses of water, and 5 servings of fruits and vegetables each day.   Next appointment: Follow up in one year for your annual wellness visit.   Preventive Care 86 Years and Older, Male  Preventive care refers to lifestyle choices and visits with your health care provider that can promote health and wellness. What does preventive care include? A yearly physical exam. This is also called an annual well check. Dental exams once or twice a year. Routine eye exams. Ask your health care provider how often you should have your eyes checked. Personal lifestyle choices, including: Daily care of your teeth and gums. Regular physical activity. Eating a healthy diet. Avoiding tobacco and drug use. Limiting alcohol use. Practicing safe sex. Taking low doses of aspirin every day. Taking vitamin and mineral supplements as recommended by your health care provider. What happens during an annual well check? The services and screenings done by your health care provider during your annual well check will depend on your age, overall health, lifestyle risk factors, and  family history of disease. Counseling  Your health care provider may ask you questions about your: Alcohol use. Tobacco use. Drug use. Emotional well-being. Home and relationship well-being. Sexual activity. Eating habits. History of falls. Memory and ability to understand (cognition). Work and work Statistician. Screening  You may have the following tests or measurements: Height, weight, and BMI. Blood pressure. Lipid and cholesterol levels. These may be checked every 5 years, or more frequently if you are over 86 years old. Skin check. Lung cancer screening. You may have this screening every year starting at age 86 if you have a 30-pack-year history of smoking and currently smoke or have quit within the past 15 years. Fecal occult blood test (FOBT) of the stool. You may have this test every year starting at age 86. Flexible sigmoidoscopy or colonoscopy. You may have a sigmoidoscopy every 5 years or a colonoscopy every 10 years starting at age 86. Prostate cancer screening. Recommendations will vary depending on your family history and other risks. Hepatitis C blood test. Hepatitis B blood test. Sexually transmitted disease (STD) testing. Diabetes screening. This is done by checking your blood sugar (glucose) after you have not eaten for a while (fasting). You may have this done every 1-3 years. Abdominal aortic aneurysm (AAA) screening. You may need this if you are a current or former smoker. Osteoporosis. You may be screened starting at age 86 if you are at high risk. Talk with your health care provider about your test results, treatment options, and if necessary, the need for more tests. Vaccines  Your health care provider may recommend certain vaccines, such as: Influenza vaccine. This is recommended every year. Tetanus,  diphtheria, and acellular pertussis (Tdap, Td) vaccine. You may need a Td booster every 10 years. Zoster vaccine. You may need this after age 86. Pneumococcal  13-valent conjugate (PCV13) vaccine. One dose is recommended after age 86. Pneumococcal polysaccharide (PPSV23) vaccine. One dose is recommended after age 86. Talk to your health care provider about which screenings and vaccines you need and how often you need them. This information is not intended to replace advice given to you by your health care provider. Make sure you discuss any questions you have with your health care provider. Document Released: 08/12/2015 Document Revised: 04/04/2016 Document Reviewed: 05/17/2015 Elsevier Interactive Patient Education  2017 [place] Prevention in the Home Falls can cause injuries. They can happen to people of all ages. There are many things you can do to make your home safe and to help prevent falls. What can I do on the outside of my home? Regularly fix the edges of walkways and driveways and fix any cracks. Remove anything that might make you trip as you walk through a door, such as a raised step or threshold. Trim any bushes or trees on the path to your home. Use bright outdoor lighting. Clear any walking paths of anything that might make someone trip, such as rocks or tools. Regularly check to see if handrails are loose or broken. Make sure that both sides of any steps have handrails. Any raised decks and porches should have guardrails on the edges. Have any leaves, snow, or ice cleared regularly. Use sand or salt on walking paths during winter. Clean up any spills in your garage right away. This includes oil or grease spills. What can I do in the bathroom? Use night lights. Install grab bars by the toilet and in the tub and shower. Do not use towel bars as grab bars. Use non-skid mats or decals in the tub or shower. If you need to sit down in the shower, use a plastic, non-slip stool. Keep the floor dry. Clean up any water that spills on the floor as soon as it happens. Remove soap buildup in the tub or shower regularly. Attach  bath mats securely with double-sided non-slip rug tape. Do not have throw rugs and other things on the floor that can make you trip. What can I do in the bedroom? Use night lights. Make sure that you have a light by your bed that is easy to reach. Do not use any sheets or blankets that are too big for your bed. They should not hang down onto the floor. Have a firm chair that has side arms. You can use this for support while you get dressed. Do not have throw rugs and other things on the floor that can make you trip. What can I do in the kitchen? Clean up any spills right away. Avoid walking on wet floors. Keep items that you use a lot in easy-to-reach places. If you need to reach something above you, use a strong step stool that has a grab bar. Keep electrical cords out of the way. Do not use floor polish or wax that makes floors slippery. If you must use wax, use non-skid floor wax. Do not have throw rugs and other things on the floor that can make you trip. What can I do with my stairs? Do not leave any items on the stairs. Make sure that there are handrails on both sides of the stairs and use them. Fix handrails that are broken or loose. Make  sure that handrails are as long as the stairways. Check any carpeting to make sure that it is firmly attached to the stairs. Fix any carpet that is loose or worn. Avoid having throw rugs at the top or bottom of the stairs. If you do have throw rugs, attach them to the floor with carpet tape. Make sure that you have a light switch at the top of the stairs and the bottom of the stairs. If you do not have them, ask someone to add them for you. What else can I do to help prevent falls? Wear shoes that: Do not have high heels. Have rubber bottoms. Are comfortable and fit you well. Are closed at the toe. Do not wear sandals. If you use a stepladder: Make sure that it is fully opened. Do not climb a closed stepladder. Make sure that both sides of the  stepladder are locked into place. Ask someone to hold it for you, if possible. Clearly mark and make sure that you can see: Any grab bars or handrails. First and last steps. Where the edge of each step is. Use tools that help you move around (mobility aids) if they are needed. These include: Canes. Walkers. Scooters. Crutches. Turn on the lights when you go into a dark area. Replace any light bulbs as soon as they burn out. Set up your furniture so you have a clear path. Avoid moving your furniture around. If any of your floors are uneven, fix them. If there are any pets around you, be aware of where they are. Review your medicines with your doctor. Some medicines can make you feel dizzy. This can increase your chance of falling. Ask your doctor what other things that you can do to help prevent falls. This information is not intended to replace advice given to you by your health care provider. Make sure you discuss any questions you have with your health care provider. Document Released: 05/12/2009 Document Revised: 12/22/2015 Document Reviewed: 08/20/2014 Elsevier Interactive Patient Education  2017 [employer].

## 2022-04-10 NOTE — Progress Notes (Signed)
Subjective:   [name] is a 88 y.o. male who presents for Medicare Annual/Subsequent preventive examination.   Virtual Visit via Telephone Note  I connected with  [name] on 04/10/22 at  2:30 PM EDT by telephone and verified that I am speaking with the correct person using two identifiers.  Location: Patient: home  Provider: [place]  Persons participating in the virtual visit: patient/Nurse Health Advisor   I discussed the limitations, risks, security and privacy concerns of performing an evaluation and management service by telephone and the availability of in person appointments. The patient expressed understanding and agreed to proceed.  Interactive audio and video telecommunications were attempted between this nurse and patient, however failed, due to patient having technical difficulties OR patient did not have access to video capability.  We continued and completed visit with audio only.  Some vital signs may be absent or patient reported.   Daphane Shepherd, LPN  Review of Systems     Cardiac Risk Factors include: advanced age (>72men, >78 women);hypertension;male gender     Objective:    Today's Vitals   04/10/22 1429  Weight: 180 lb (81.6 kg)  Height: 5\' 8"  (1.727 m)   Body mass index is 27.37 kg/m.     04/10/2022    2:35 PM 05/25/2021    6:22 PM 04/05/2021    1:00 PM 10/17/2020   10:17 PM 10/14/2019    1:41 PM 07/07/2018    3:57 PM  Advanced Directives  Does Patient Have a Medical Advance Directive? Yes Yes Yes No No Yes  Type of Paramedic of [place];Living will Living will;Healthcare Power of Attorney Living will   Living will  Does patient want to make changes to medical advance directive?   No - Patient declined     Copy of [place] in Chart? No - copy requested       Would patient like information on creating a medical advance directive?    No - Patient declined No - Patient declined      Current Medications (verified) Outpatient Encounter Medications as of 04/10/2022  Medication Sig   acetaminophen (TYLENOL) 500 MG tablet Take 500 mg by mouth every 6 (six) hours as needed.   amLODipine (NORVASC) 10 MG tablet Take 1 tablet (10 mg total) by mouth every morning.   Carboxymethylcellulose Sod PF 0.5 % SOLN Place 1 drop into both eyes as needed (Dry Eyes).   Cholecalciferol (VITAMIN D3) 2000 units TABS Take 1 tablet by mouth daily.   clopidogrel (PLAVIX) 75 MG tablet TAKE ONE TABLET BY MOUTH EVERY MORNING   docusate sodium (COLACE) 100 MG capsule Take 1 capsule (100 mg total) by mouth 2 (two) times daily.   furosemide (LASIX) 20 MG tablet TAKE ONE TABLET BY MOUTH ONCE DAILY   gabapentin (NEURONTIN) 300 MG capsule Take 1 capsule (300 mg total) by mouth 3 (three) times daily. Take one capsule three times daily   losartan (COZAAR) 100 MG tablet Take 1 tablet (100 mg total) by mouth daily.   simvastatin (ZOCOR) 10 MG tablet TAKE ONE TABLET BY MOUTH EVERYDAY AT BEDTIME   sodium zirconium cyclosilicate (LOKELMA) 10 g PACK packet Take 10 g by mouth 2 (two) times a week.   vitamin B-12 (CYANOCOBALAMIN) 1000 MCG tablet Take 1,000 mcg by mouth daily.   vitamin C (ASCORBIC ACID) 500 MG tablet Take 500 mg by mouth daily.   No facility-administered encounter medications on file as of 04/10/2022.  Allergies (verified) Clonidine, Codeine, Lisinopril, and Penicillins   History: Past Medical History:  Diagnosis Date   Branch retinal vein occlusion of left eye    High cholesterol    Hypertension    TIA (transient ischemic attack)    Past Surgical History:  Procedure Laterality Date   CAROTID ENDARTERECTOMY Right    HERNIA REPAIR     INNER EAR SURGERY Right    for hearing loss   Family History  Problem Relation Age of Onset   Lung cancer Father    Social History   Socioeconomic History   Marital status: Widowed    Spouse name: Not on file   Number of children: 4   Years  of education: Not on file   Highest education level: Not on file  Occupational History    Comment: retired, 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  Tobacco Use   Smoking status: Never   Smokeless tobacco: Never  Vaping Use   Vaping Use: Never used  Substance and Sexual Activity   Alcohol use: Yes    Comment: 1 glass wine dialy   Drug use: Never   Sexual activity: Not Currently  Other Topics Concern   Not on file  Social History Narrative   Lives alone   Social Determinants of Health   Financial Resource Strain: Low Risk  (04/10/2022)   Overall Financial Resource Strain (CARDIA)    Difficulty of Paying Living Expenses: Not hard at all  Food Insecurity: No Food Insecurity (04/10/2022)   Hunger Vital Sign    Worried About Running Out of Food in the Last Year: Never true    [place] in the Last Year: Never true  Transportation Needs: No Transportation Needs (04/10/2022)   PRAPARE - Hydrologist (Medical): No    Lack of Transportation (Non-Medical): No  Physical Activity: Insufficiently Active (04/10/2022)   Exercise Vital Sign    Days of Exercise per Week: 3 days    Minutes of Exercise per Session: 30 min  Stress: No Stress Concern Present (04/10/2022)   [place]    Feeling of Stress : Not at all  Social Connections: Socially Isolated (04/10/2022)   Social Connection and Isolation Panel [NHANES]    Frequency of Communication with Friends and Family: More than three times a week    Frequency of Social Gatherings with Friends and Family: More than three times a week    Attends Religious Services: Never    Marine scientist or Organizations: No    Attends Archivist Meetings: Never    Marital Status: Widowed    Tobacco Counseling Counseling given: Not Answered   Clinical Intake:  Pre-visit preparation completed: Yes  Pain : No/denies pain     Nutritional Risks:  None Diabetes: No  How often do you need to have someone help you when you read instructions, pamphlets, or other written materials from your doctor or pharmacy?: 1 - Never  Diabetic?no  Interpreter Needed?: No  Information entered by :: Jadene Pierini, LPN   Activities of Daily Living    04/10/2022    2:35 PM 04/06/2022    2:18 PM  In your present state of health, do you have any difficulty performing the following activities:  Hearing? 0 1  Vision? 0 0  Difficulty concentrating or making decisions? 0 0  Walking or climbing stairs? 0 1  Dressing or bathing? 0 0  Doing errands, shopping?  0 0  Preparing Food and eating ? N N  Using the Toilet? N N  In the past six months, have you accidently leaked urine? N N  Do you have problems with loss of bowel control? N N  Managing your Medications? N N  Managing your Finances? N N  Housekeeping or managing your Housekeeping? N N    Patient Care Team: Libby Maw, MD as PCP - General (Family Medicine) Germaine Pomfret, [facility] as Pharmacist (Pharmacist)  Indicate any recent Medical Services you may have received from other than [facility] providers in the past year (date may be approximate).     Assessment:   This is a routine wellness examination for [facility].  Hearing/Vision screen Vision Screening - Comments:: Annual eye exams wear glasses   Dietary issues and exercise activities discussed: Current Exercise Habits: Home exercise routine, Type of exercise: walking, Time (Minutes): 30, Frequency (Times/Week): 2, Weekly Exercise (Minutes/Week): 60, Exercise limited by: None identified   Goals Addressed             This Visit's Progress    [place] healthy lifestyle and independence   On track      Depression Screen    04/10/2022    2:33 PM 03/13/2022    2:04 PM 12/12/2021    2:36 PM 08/29/2021    1:07 PM 08/01/2021    2:25 PM 04/05/2021    2:00 PM 01/19/2021   10:38 AM  PHQ 2/9 Scores  PHQ - 2 Score 0 0 0 0 0 0 0   PHQ- 9 Score      0     Fall Risk    04/10/2022    2:31 PM 04/06/2022    2:18 PM 03/13/2022    2:04 PM 12/12/2021    2:36 PM 08/29/2021    1:07 PM  [place] in the past year? 0 0 0 0 0  Number falls in past yr: 0 0 0 0 0  Injury with Fall? 0 0  1   Risk for fall due to : No Fall Risks      Follow up Falls prevention discussed        FALL RISK PREVENTION PERTAINING TO THE HOME:  Any stairs in or around the home? Yes  If so, are there any without handrails? No  Home free of loose throw rugs in walkways, pet beds, electrical cords, etc? Yes  Adequate lighting in your home to reduce risk of falls? Yes   ASSISTIVE DEVICES UTILIZED TO PREVENT FALLS:  Life alert? No  Use of a cane, walker or w/c? Yes  Grab bars in the bathroom? Yes  Shower chair or bench in shower? Yes  Elevated toilet seat or a handicapped toilet? Yes          04/10/2022    2:36 PM 04/05/2021    2:00 PM  6CIT Screen  What Year? 0 points 0 points  What month? 0 points 0 points  What time? 0 points 0 points  Count back from 20 0 points 0 points  Months in reverse 0 points 0 points  Repeat phrase 2 points   Total Score 2 points     Immunizations Immunization History  Administered Date(s) Administered   Fluad Quad(high Dose 65+) 04/15/2019   Influenza Whole 06/19/2007   Influenza, High Dose Seasonal PF 05/02/2018   Influenza-Unspecified 05/13/2017, 05/28/2020, 06/05/2021   Moderna Sars-Covid-2 Vaccination 09/30/2019, 10/28/2019, 05/28/2020, 06/05/2021   Pneumococcal Conjugate-13 07/31/2015  Pneumococcal Polysaccharide-23 07/31/2015, 10/24/2016   Zoster Recombinat (Shingrix) 04/26/2020, 07/29/2020    TDAP status: Due, Education has been provided regarding the importance of this vaccine. Advised may receive this vaccine at local pharmacy or Health Dept. Aware to provide a copy of the vaccination record if obtained from local pharmacy or Health Dept. Verbalized acceptance and understanding.  Flu  Vaccine status: Due, Education has been provided regarding the importance of this vaccine. Advised may receive this vaccine at local pharmacy or Health Dept. Aware to provide a copy of the vaccination record if obtained from local pharmacy or Health Dept. Verbalized acceptance and understanding.  Pneumococcal vaccine status: Up to date  Covid-19 vaccine status: Completed vaccines  Qualifies for Shingles Vaccine? Yes   Zostavax completed Yes   Shingrix Completed?: Yes  Screening Tests Health Maintenance  Topic Date Due   INFLUENZA VACCINE  02/27/2022   TETANUS/TDAP  07/22/2028 (Originally 10/09/1946)   Pneumonia Vaccine 77+ Years old  Completed   Zoster Vaccines- Shingrix  Completed   HPV VACCINES  Aged Out   COVID-19 Vaccine  Discontinued    Health Maintenance  Health Maintenance Due  Topic Date Due   INFLUENZA VACCINE  02/27/2022    Colorectal cancer screening: No longer required.   Lung Cancer Screening: (Low Dose CT Chest recommended if Age 34-80 years, 30 pack-year currently smoking OR have quit w/in 15years.) does not qualify.   Lung Cancer Screening Referral: n/a  Additional Screening:  Hepatitis C Screening: does not qualify;   Vision Screening: Recommended annual ophthalmology exams for early detection of glaucoma and other disorders of the eye. Is the patient up to date with their annual eye exam?  Yes  Who is the provider or what is the name of the office in which the patient attends annual eye exams? Dr.Tapadino If pt is not established with a provider, would they like to be referred to a provider to establish care? No .   Dental Screening: Recommended annual dental exams for proper oral hygiene  Community Resource Referral / Chronic Care Management: CRR required this visit?  No   CCM required this visit?  No      Plan:     I have personally reviewed and noted the following in the patient's chart:   Medical and social history Use of alcohol, tobacco  or illicit drugs  Current medications and supplements including opioid prescriptions. Patient is not currently taking opioid prescriptions. Functional ability and status Nutritional status Physical activity Advanced directives List of other physicians Hospitalizations, surgeries, and ER visits in previous 12 months Vitals Screenings to include cognitive, depression, and falls Referrals and appointments  In addition, I have reviewed and discussed with patient certain preventive protocols, quality metrics, and best practice recommendations. A written personalized care plan for preventive services as well as general preventive health recommendations were provided to patient.     Daphane Shepherd, LPN   [id number]   Nurse Notes: Due updated TDAP

## 2022-04-12 ENCOUNTER — Telehealth: Payer: Self-pay

## 2022-04-12 NOTE — Progress Notes (Signed)
Chronic Care Management Pharmacy Assistant   Name: [name]  MRN: [id number] DOB: 1928/04/18  Reason for Encounter: Medication Review/Medication Coordination Call.   Recent office visits:  04/10/2022 Melvin Pierini LPN (PCP Office) No Medication Changes noted, Medicare Wellness Completed  03/13/2022 Dr. Ethelene Hal MD (PCP) No Medication Changes noted, Follow up 3 months  Recent consult visits:  No recent consult visit  Hospital visits:  None in previous 6 months  Medications: Outpatient Encounter Medications as of 04/12/2022  Medication Sig Note   acetaminophen (TYLENOL) 500 MG tablet Take 500 mg by mouth every 6 (six) hours as needed.    amLODipine (NORVASC) 10 MG tablet Take 1 tablet (10 mg total) by mouth every morning.    Carboxymethylcellulose Sod PF 0.5 % SOLN Place 1 drop into both eyes as needed (Dry Eyes).    Cholecalciferol (VITAMIN D3) 2000 units TABS Take 1 tablet by mouth daily.    clopidogrel (PLAVIX) 75 MG tablet TAKE ONE TABLET BY MOUTH EVERY MORNING    docusate sodium (COLACE) 100 MG capsule Take 1 capsule (100 mg total) by mouth 2 (two) times daily.    furosemide (LASIX) 20 MG tablet TAKE ONE TABLET BY MOUTH ONCE DAILY    gabapentin (NEURONTIN) 300 MG capsule Take 1 capsule (300 mg total) by mouth 3 (three) times daily. Take one capsule three times daily 03/13/2022: [address]    losartan (COZAAR) 100 MG tablet Take 1 tablet (100 mg total) by mouth daily.    simvastatin (ZOCOR) 10 MG tablet TAKE ONE TABLET BY MOUTH EVERYDAY AT BEDTIME    sodium zirconium cyclosilicate (LOKELMA) 10 g PACK packet Take 10 g by mouth 2 (two) times a week. 03/13/2022: Only sent to [facility]    vitamin B-12 (CYANOCOBALAMIN) 1000 MCG tablet Take 1,000 mcg by mouth daily.    vitamin C (ASCORBIC ACID) 500 MG tablet Take 500 mg by mouth daily.    No facility-administered encounter medications on file as of 04/12/2022.   Care Gaps: Influenza Vaccine   Star Rating Drugs: Simvastatin  25 mg last filled on 03/21/2022 for 30 day supply at [facility] Losartan 100 mg last filled 03/21/2022 for 30 day supply at [place].   Medication Fill Gaps: None  Reviewed chart for medication changes ahead of medication coordination call.  BP Readings from Last 3 Encounters:  03/13/22 112/64  12/12/21 (!) 146/70  08/29/21 132/64    Lab Results  Component Value Date   HGBA1C 6.2 01/19/2021     Patient obtains medications through Adherence Packaging  30 Days   Last adherence delivery included:  Clopidogrel 75 mg 1 tablet daily - breakfast Simvastatin 10 mg 1 tablet daily - bedtime Vitamin D3 2000 units 1 tablet daily - breakfast Vitamin C 500 mg 1 tablet daily - breakfast Amlodipine 10 mg 1 tablet daily - breakfast  Losartan 100 mg 1 tablet daily -Breakfast Docusate sodium 100 mg 1 tablet  twice daily- breakfast, evening meal  Vitamin B12 1000 MG daily -Breakfast Furosemide 20 mg 1 tablet daily- Breakfast Systane Eye drops 0.6%- 1 drop into both eye PRN Sodium bicarbonate 650 mg one tablet two times daily- breakfast, evening meals  Patient declined medications last month: Gabapentin 300 mg Take one capsule each morning and 2 in the evening prior to bedtime.Patient states he want one tablet at Breakfast, one tablet at evening meals , and one tablet at bedtime.(Patient would like to get this filled at [facility] in [address] because it is cheaper).Notified [facility]. Lokelma 10 g - one packet twice weekly (Patient would like to get this filled at [facility] in [place] because it is cheaper).Notified [facility]. Acetaminophen 500 mg PRN Retaine Eye Drops 0.5% (Adequate supply) Saline Nose Solution - Adequate supply Fluticasone nasal spray 50 mcg 2 sprays in each nostrils every night   Patient is due for next adherence delivery on: 04/23/2022. Called patient and reviewed medications and coordinated  delivery.  Unable to reach patient to completed Medication Coordination form. Form was completed based on last month delivery. [facility] will contact patient to confirm delivery.[name], CPP was notified I was unable to reach patient  This delivery to include: Clopidogrel 75 mg 1 tablet daily - breakfast Simvastatin 10 mg 1 tablet daily - bedtime Vitamin D3 2000 units 1 tablet daily - breakfast Vitamin C 500 mg 1 tablet daily - breakfast Amlodipine 10 mg 1 tablet daily - breakfast  Losartan 100 mg 1 tablet daily -Breakfast Docusate sodium 100 mg 1 tablet  twice daily- breakfast, evening meal  Vitamin B12 1000 MG daily -Breakfast Furosemide 20 mg 1 tablet daily- Breakfast Systane Eye drops 0.6%- 1 drop into both eye PRN Sodium bicarbonate 650 mg one tablet two times daily- breakfast, evening meals  Patient declined the following medications  Gabapentin 300 mg Take one capsule each morning and 2 in the evening prior to bedtime.Patient states he want one tablet at Breakfast, one tablet at evening meals , and one tablet at bedtime.(Patient would like to get this filled at [facility] in [place] because it is cheaper).Notified [facility]. Lokelma 10 g - one packet twice weekly (Patient would like to get this filled at [facility] in [place] because it is cheaper).Notified [facility]. Acetaminophen 500 mg PRN Retaine Eye Drops 0.5% (Adequate supply) Saline Nose Solution - Adequate supply Fluticasone nasal spray 50 mcg 2 sprays in each nostrils every night  Patient needs refills for None ID.  Unable to Confirmed delivery date of 04/23/2022 ([facility]).  [place] Pharmacist Assistant [phone]

## 2022-05-07 ENCOUNTER — Telehealth: Payer: Self-pay | Admitting: Family Medicine

## 2022-05-07 NOTE — Telephone Encounter (Signed)
Spoke with patient daughter who verbally understood we have not received anything regarding rolling walker for patient, per Katharine Look she will call patients insurance who supposed to have sent over forms to be filled out and see what the hold up is.

## 2022-05-07 NOTE — Telephone Encounter (Signed)
Pt's daughter, Katharine Look is calling in checking the status of him getting a Rollator a walker with wheels. The company is sending a form for Dr Ethelene Hal to sign off on. Please advise Katharine Look @ [phone].

## 2022-05-09 ENCOUNTER — Other Ambulatory Visit: Payer: Self-pay | Admitting: Family Medicine

## 2022-05-09 DIAGNOSIS — I129 Hypertensive chronic kidney disease with stage 1 through stage 4 chronic kidney disease, or unspecified chronic kidney disease: Secondary | ICD-10-CM | POA: Diagnosis not present

## 2022-05-09 DIAGNOSIS — N2581 Secondary hyperparathyroidism of renal origin: Secondary | ICD-10-CM | POA: Diagnosis not present

## 2022-05-09 DIAGNOSIS — I10 Essential (primary) hypertension: Secondary | ICD-10-CM

## 2022-05-09 DIAGNOSIS — E7800 Pure hypercholesterolemia, unspecified: Secondary | ICD-10-CM

## 2022-05-09 DIAGNOSIS — E785 Hyperlipidemia, unspecified: Secondary | ICD-10-CM | POA: Diagnosis not present

## 2022-05-09 DIAGNOSIS — N189 Chronic kidney disease, unspecified: Secondary | ICD-10-CM | POA: Diagnosis not present

## 2022-05-09 DIAGNOSIS — I6523 Occlusion and stenosis of bilateral carotid arteries: Secondary | ICD-10-CM

## 2022-05-09 DIAGNOSIS — N1832 Chronic kidney disease, stage 3b: Secondary | ICD-10-CM

## 2022-05-09 DIAGNOSIS — D631 Anemia in chronic kidney disease: Secondary | ICD-10-CM | POA: Diagnosis not present

## 2022-05-09 DIAGNOSIS — N184 Chronic kidney disease, stage 4 (severe): Secondary | ICD-10-CM | POA: Diagnosis not present

## 2022-05-09 DIAGNOSIS — E875 Hyperkalemia: Secondary | ICD-10-CM | POA: Diagnosis not present

## 2022-05-10 DIAGNOSIS — H3562 Retinal hemorrhage, left eye: Secondary | ICD-10-CM | POA: Diagnosis not present

## 2022-05-10 DIAGNOSIS — H34832 Tributary (branch) retinal vein occlusion, left eye, with macular edema: Secondary | ICD-10-CM | POA: Diagnosis not present

## 2022-05-10 DIAGNOSIS — H31092 Other chorioretinal scars, left eye: Secondary | ICD-10-CM | POA: Diagnosis not present

## 2022-05-11 ENCOUNTER — Telehealth: Payer: Self-pay

## 2022-05-11 DIAGNOSIS — G629 Polyneuropathy, unspecified: Secondary | ICD-10-CM

## 2022-05-11 NOTE — Progress Notes (Signed)
Chronic Care Management Pharmacy Assistant   Name: DAIMION ADAMCIK  MRN: [id number] DOB: 07-09-1928  Reason for Encounter: Medication Review/Medication Coordination Call.   Recent office visits:  None ID  Recent consult visits:  None ID  Hospital visits:  None in previous 6 months  Medications: Outpatient Encounter Medications as of 05/11/2022  Medication Sig Note   acetaminophen (TYLENOL) 500 MG tablet Take 500 mg by mouth every 6 (six) hours as needed.    amLODipine (NORVASC) 10 MG tablet TAKE ONE TABLET BY MOUTH EVERY MORNING    Carboxymethylcellulose Sod PF 0.5 % SOLN Place 1 drop into both eyes as needed (Dry Eyes).    Cholecalciferol (VITAMIN D3) 2000 units TABS Take 1 tablet by mouth daily.    clopidogrel (PLAVIX) 75 MG tablet TAKE ONE TABLET BY MOUTH EVERY MORNING    docusate sodium (COLACE) 100 MG capsule Take 1 capsule (100 mg total) by mouth 2 (two) times daily.    furosemide (LASIX) 20 MG tablet TAKE ONE TABLET BY MOUTH ONCE DAILY    gabapentin (NEURONTIN) 300 MG capsule Take 1 capsule (300 mg total) by mouth 3 (three) times daily. Take one capsule three times daily 03/13/2022: [address]    losartan (COZAAR) 100 MG tablet TAKE ONE TABLET BY MOUTH ONCE DAILY    simvastatin (ZOCOR) 10 MG tablet TAKE ONE TABLET BY MOUTH EVERYDAY AT BEDTIME    sodium zirconium cyclosilicate (LOKELMA) 10 g PACK packet Take 10 g by mouth 2 (two) times a week. 03/13/2022: Only sent to [facility]    vitamin B-12 (CYANOCOBALAMIN) 1000 MCG tablet Take 1,000 mcg by mouth daily.    vitamin C (ASCORBIC ACID) 500 MG tablet Take 500 mg by mouth daily.    No facility-administered encounter medications on file as of 05/11/2022.    Care Gaps: Influenza Vaccine   Star Rating Drugs: Simvastatin 25 mg last filled on 04/19/2022 for 30 day supply at [facility] Losartan 100 mg last filled 04/19/2022 for 30 day supply at [place].   Medication Fill Gaps: None  Reviewed chart for  medication changes ahead of medication coordination call.   BP Readings from Last 3 Encounters:  03/13/22 112/64  12/12/21 (!) 146/70  08/29/21 132/64    Lab Results  Component Value Date   HGBA1C 6.2 01/19/2021     Patient obtains medications through Adherence Packaging  30 Days   Unable to reach patient to completed Medication Coordination form. Form was completed based on last month delivery. [facility] will contact patient to confirm delivery.  Last adherence delivery included:  Clopidogrel 75 mg 1 tablet daily - breakfast Simvastatin 10 mg 1 tablet daily - bedtime Vitamin D3 2000 units 1 tablet daily - breakfast Vitamin C 500 mg 1 tablet daily - breakfast Amlodipine 10 mg 1 tablet daily - breakfast  Losartan 100 mg 1 tablet daily -Breakfast Docusate sodium 100 mg 1 tablet  twice daily- breakfast, evening meal  Vitamin B12 1000 MG daily -Breakfast Furosemide 20 mg 1 tablet daily- Breakfast Systane Eye drops 0.6%- 1 drop into both eye PRN Sodium bicarbonate 650 mg one tablet two times daily- breakfast, evening meals  Patient declined medications last month: Gabapentin 300 mg Take one capsule each morning and 2 in the evening prior to bedtime.Patient states he want one tablet at Breakfast, one tablet at evening meals , and one tablet at bedtime.(Patient would like to get this filled at [facility] in [place] because it is  cheaper).Notified [facility]. Lokelma 10 g - one packet twice weekly (Patient would like to get this filled at [facility] in [place] because it is cheaper).Notified [facility]. Acetaminophen 500 mg PRN Retaine Eye Drops 0.5% (Adequate supply) Saline Nose Solution - Adequate supply Fluticasone nasal spray 50 mcg 2 sprays in each nostrils every night  Patient is due for next adherence delivery on: 05/23/2022. Called patient and reviewed medications and coordinated delivery.  This delivery to  include: Clopidogrel 75 mg 1 tablet daily - breakfast Simvastatin 10 mg 1 tablet daily - bedtime Vitamin D3 2000 units 1 tablet daily - breakfast Vitamin C 500 mg 1 tablet daily - breakfast Amlodipine 10 mg 1 tablet daily - breakfast  Losartan 100 mg 1 tablet daily -Breakfast Docusate sodium 100 mg 1 tablet  twice daily- breakfast, evening meal  Vitamin B12 1000 MG daily -Breakfast Furosemide 20 mg 1 tablet daily- Breakfast Systane Eye drops 0.6%- 1 drop into both eye PRN Sodium bicarbonate 650 mg one tablet two times daily- breakfast, evening meals - I reach out to [facility] to ask if patient should be taking sodium bicarbonate as they denied to send in a refill, and patient confirm he is still taking. Gabapentin 300 mg Take one capsule each morning and 2 in the evening prior to bedtime.Patient states he want one tablet at Breakfast, one tablet at evening meals , and one tablet at bedtime Lokelma 10 g - one packet twice weekly   Patient declined the following medications: Acetaminophen 500 mg PRN Retaine Eye Drops 0.5% (Adequate supply) Saline Nose Solution - Adequate supply Fluticasone nasal spray 50 mcg 2 sprays in each nostrils every night  Patient needs refills for Sodium bicarbonate .  Confirmed delivery date of 05/23/2022 ([facility]), advised patient that pharmacy will contact them the morning of delivery.  [place] Pharmacist Assistant [phone]

## 2022-05-14 MED ORDER — GABAPENTIN 300 MG PO CAPS: 300.0000 mg | ORAL_CAPSULE | Freq: Three times a day (TID) | ORAL | 1 refills | Status: DC

## 2022-05-17 DIAGNOSIS — H34832 Tributary (branch) retinal vein occlusion, left eye, with macular edema: Secondary | ICD-10-CM | POA: Diagnosis not present

## 2022-06-07 ENCOUNTER — Telehealth: Payer: Self-pay

## 2022-06-07 NOTE — Progress Notes (Signed)
Chronic Care Management Pharmacy Assistant   Name: [name]  MRN: [id number] DOB: 1927-08-19   Reason for Encounter: Medication Review/Medication Coordination Call.   Recent office visits:  None ID  Recent consult visits:  None ID  Hospital visits:  None in previous 6 months  Medications: Outpatient Encounter Medications as of 06/07/2022  Medication Sig Note   acetaminophen (TYLENOL) 500 MG tablet Take 500 mg by mouth every 6 (six) hours as needed.    amLODipine (NORVASC) 10 MG tablet TAKE ONE TABLET BY MOUTH EVERY MORNING    Carboxymethylcellulose Sod PF 0.5 % SOLN Place 1 drop into both eyes as needed (Dry Eyes).    Cholecalciferol (VITAMIN D3) 2000 units TABS Take 1 tablet by mouth daily.    clopidogrel (PLAVIX) 75 MG tablet TAKE ONE TABLET BY MOUTH EVERY MORNING    docusate sodium (COLACE) 100 MG capsule Take 1 capsule (100 mg total) by mouth 2 (two) times daily.    furosemide (LASIX) 20 MG tablet TAKE ONE TABLET BY MOUTH ONCE DAILY    gabapentin (NEURONTIN) 300 MG capsule Take 1 capsule (300 mg total) by mouth 3 (three) times daily. Take one capsule three times daily    losartan (COZAAR) 100 MG tablet TAKE ONE TABLET BY MOUTH ONCE DAILY    simvastatin (ZOCOR) 10 MG tablet TAKE ONE TABLET BY MOUTH EVERYDAY AT BEDTIME    sodium zirconium cyclosilicate (LOKELMA) 10 g PACK packet Take 10 g by mouth 2 (two) times a week. 03/13/2022: Only sent to [facility]    vitamin B-12 (CYANOCOBALAMIN) 1000 MCG tablet Take 1,000 mcg by mouth daily.    vitamin C (ASCORBIC ACID) 500 MG tablet Take 500 mg by mouth daily.    No facility-administered encounter medications on file as of 06/07/2022.   Care Gaps: Influenza Vaccine   Star Rating Drugs: Simvastatin 25 mg last filled on 04/19/2022 for 30 day supply at [facility] Losartan 100 mg last filled 04/19/2022 for 30 day supply at [place].   Reviewed chart for medication changes ahead of medication coordination  call.  BP Readings from Last 3 Encounters:  03/13/22 112/64  12/12/21 (!) 146/70  08/29/21 132/64    Lab Results  Component Value Date   HGBA1C 6.2 01/19/2021     Patient obtains medications through Adherence Packaging  30 Days   Last adherence delivery included:  Clopidogrel 75 mg 1 tablet daily - breakfast Simvastatin 10 mg 1 tablet daily - bedtime Vitamin D3 2000 units 1 tablet daily - breakfast Vitamin C 500 mg 1 tablet daily - breakfast Amlodipine 10 mg 1 tablet daily - breakfast  Losartan 100 mg 1 tablet daily -Breakfast Docusate sodium 100 mg 1 tablet  twice daily- breakfast, evening meal  Vitamin B12 1000 MG daily -Breakfast Furosemide 20 mg 1 tablet daily- Breakfast Systane Eye drops 0.6%- 1 drop into both eye PRN Sodium bicarbonate 650 mg one tablet two times daily- breakfast, evening meals - I reach out to [facility] to ask if patient should be taking sodium bicarbonate as they denied to send in a refill, and patient confirm he is still taking. Gabapentin 300 mg Take one capsule each morning and 2 in the evening prior to bedtime.Patient states he want one tablet at Breakfast, one tablet at evening meals , and one tablet at bedtime Lokelma 10 g - one packet twice weekly   Patient declined medications last month Acetaminophen 500 mg PRN Retaine Eye Drops 0.5% (Adequate supply) Saline Nose Solution -  Adequate supply Fluticasone nasal spray 50 mcg 2 sprays in each nostrils every night  Patient is due for next adherence delivery on: 06/20/2022. Called patient and reviewed medications and coordinated delivery.  This delivery to include: Clopidogrel 75 mg 1 tablet daily - breakfast Simvastatin 10 mg 1 tablet daily - bedtime Vitamin D3 2000 units 1 tablet daily - breakfast Vitamin C 500 mg 1 tablet daily - breakfast Amlodipine 10 mg 1 tablet daily - breakfast  Losartan 100 mg 1 tablet daily -Breakfast Docusate sodium 100 mg 1 tablet  twice daily- breakfast,  evening meal  Vitamin B12 1000 MG daily -Breakfast Furosemide 20 mg 1 tablet daily- Breakfast Systane Eye drops 0.6%- 1 drop into both eye PRN Sodium bicarbonate 650 mg one tablet two times daily- breakfast, evening meals Gabapentin 300 mg Take one capsule each morning and 2 in the evening prior to bedtime.Patient states he want one tablet at Breakfast, one tablet at evening meals , and one tablet at bedtime Lokelma 10 g - one packet twice weekly   Patient declined the following medications: Acetaminophen 500 mg PRN Retaine Eye Drops 0.5% (Adequate supply) Saline Nose Solution - Adequate supply Fluticasone nasal spray 50 mcg 2 sprays in each nostrils every night  Patient needs refills for None ID.  Confirmed delivery date of 06/20/2022 ([facility]), advised patient that pharmacy will contact them the morning of delivery.  [place] Pharmacist Assistant [phone]

## 2022-06-12 ENCOUNTER — Ambulatory Visit (INDEPENDENT_AMBULATORY_CARE_PROVIDER_SITE_OTHER): Payer: Medicare HMO | Admitting: Family Medicine

## 2022-06-12 ENCOUNTER — Encounter: Payer: Self-pay | Admitting: Family Medicine

## 2022-06-12 VITALS — BP 144/76 | HR 57 | Temp 97.5°F | Ht 68.0 in | Wt 188.2 lb

## 2022-06-12 DIAGNOSIS — I10 Essential (primary) hypertension: Secondary | ICD-10-CM

## 2022-06-12 DIAGNOSIS — N184 Chronic kidney disease, stage 4 (severe): Secondary | ICD-10-CM | POA: Diagnosis not present

## 2022-06-12 DIAGNOSIS — E7800 Pure hypercholesterolemia, unspecified: Secondary | ICD-10-CM | POA: Diagnosis not present

## 2022-06-12 DIAGNOSIS — E538 Deficiency of other specified B group vitamins: Secondary | ICD-10-CM

## 2022-06-12 DIAGNOSIS — E559 Vitamin D deficiency, unspecified: Secondary | ICD-10-CM | POA: Diagnosis not present

## 2022-06-12 DIAGNOSIS — D649 Anemia, unspecified: Secondary | ICD-10-CM

## 2022-06-12 LAB — BASIC METABOLIC PANEL
BUN: 36 mg/dL — ABNORMAL HIGH (ref 6–23)
CO2: 23 mEq/L (ref 19–32)
Calcium: 8.5 mg/dL (ref 8.4–10.5)
Chloride: 103 mEq/L (ref 96–112)
Creatinine, Ser: 2.57 mg/dL — ABNORMAL HIGH (ref 0.40–1.50)
GFR: 21.03 mL/min — ABNORMAL LOW (ref >60.00)
Glucose, Bld: 93 mg/dL (ref 70–99)
Potassium: 5.2 mEq/L — ABNORMAL HIGH (ref 3.5–5.1)
Sodium: 136 mEq/L (ref 135–145)

## 2022-06-12 LAB — LIPID PANEL
Cholesterol: 136 mg/dL (ref 0–200)
HDL: 43.7 mg/dL (ref >39.00)
LDL Cholesterol: 70 mg/dL (ref 0–99)
NonHDL: 91.98
Total CHOL/HDL Ratio: 3
Triglycerides: 111 mg/dL (ref 0.0–149.0)
VLDL: 22.2 mg/dL (ref 0.0–40.0)

## 2022-06-12 LAB — VITAMIN B12: Vitamin B-12: 1485 pg/mL — ABNORMAL HIGH (ref 211–911)

## 2022-06-12 LAB — CBC
HCT: 41.3 % (ref 39.0–52.0)
Hemoglobin: 13.2 g/dL (ref 13.0–17.0)
MCHC: 31.9 g/dL (ref 30.0–36.0)
MCV: 85.7 fl (ref 78.0–100.0)
Platelets: 239 10*3/uL (ref 150.0–400.0)
RBC: 4.81 Mil/uL (ref 4.22–5.81)
RDW: 14.2 % (ref 11.5–15.5)
WBC: 8.9 10*3/uL (ref 4.0–10.5)

## 2022-06-12 LAB — VITAMIN D 25 HYDROXY (VIT D DEFICIENCY, FRACTURES): VITD: 61.68 ng/mL (ref 30.00–100.00)

## 2022-06-12 NOTE — Progress Notes (Signed)
Established Patient Office Visit  Subjective   Patient ID: [name], male    DOB: [birth year]  Age: 88 y.o. MRN: [id number]  Chief Complaint  Patient presents with   Follow-up    3 month follow up, no concerns. Patient fasting.     HPI mostly doing well.  Has been able to walk for greater distances with his rollator.  He has been stressed recently over consternation with his con[employer].  Continues amlodipine 5 mg, furosemide 20 mg and losartan 100 mg for hypertension and CKD.  Continue Zocor 10 mg for elevated cholesterol.    Review of Systems  Constitutional: Negative.   HENT: Negative.    Eyes:  Negative for blurred vision, discharge and redness.  Respiratory: Negative.    Cardiovascular: Negative.   Gastrointestinal:  Negative for abdominal pain.  Genitourinary: Negative.   Musculoskeletal: Negative.  Negative for myalgias.  Skin:  Negative for rash.  Neurological:  Negative for tingling, loss of consciousness and weakness.  Endo/Heme/Allergies:  Negative for polydipsia.      Objective:     BP (!) 142/78 (BP Location: Right Arm, Patient Position: Sitting, Cuff Size: Normal)   Pulse (!) 57   Temp (!) 97.5 F (36.4 C) (Temporal)   Ht 5\' 8"  (1.727 m)   Wt 188 lb 3.2 oz (85.4 kg)   SpO2 94%   BMI 28.62 kg/m    Physical Exam Constitutional:      General: He is not in acute distress.    Appearance: Normal appearance. He is not ill-appearing, toxic-appearing or diaphoretic.  HENT:     Head: Normocephalic and atraumatic.     Right Ear: External ear normal.     Left Ear: External ear normal.  Eyes:     General: No scleral icterus.       Right eye: No discharge.        Left eye: No discharge.     Extraocular Movements: Extraocular movements intact.     Conjunctiva/sclera: Conjunctivae normal.  Cardiovascular:     Rate and Rhythm: Normal rate and regular rhythm.  Pulmonary:     Effort: Pulmonary effort is normal. No respiratory distress.      Breath sounds: Normal breath sounds.  Skin:    General: Skin is warm and dry.  Neurological:     Mental Status: He is alert and oriented to person, place, and time.  Psychiatric:        Mood and Affect: Mood normal.        Behavior: Behavior normal.      No results found for any visits on 06/12/22.    The ASCVD Risk score (Arnett DK, et al., 2019) failed to calculate for the following reasons:   The 2019 ASCVD risk score is only valid for ages 80 to 74    Assessment & Plan:   Problem List Items Addressed This Visit       Cardiovascular and Mediastinum   Essential hypertension - Primary   Relevant Orders   Basic metabolic panel   CBC   Urinalysis, Routine w reflex microscopic     Genitourinary   Stage 4 chronic kidney disease (HCC)   Relevant Orders   Basic metabolic panel     Other   Elevated cholesterol   Relevant Orders   Lipid panel   Vitamin D deficiency   Relevant Orders   VITAMIN D 25 Hydroxy (Vit-D Deficiency, Fractures)   Vitamin B 12 deficiency   Relevant Orders  Vitamin B12   Other Visit Diagnoses     Normocytic anemia       Relevant Orders   CBC   Iron, TIBC and Ferritin Panel       Return in about 3 months (around 09/12/2022), or if symptoms worsen or fail to improve.  Continue current medications as above.  Adjustments made pending results of labs.  Libby Maw, MD

## 2022-06-13 LAB — URINALYSIS, ROUTINE W REFLEX MICROSCOPIC
Bilirubin Urine: NEGATIVE
Hgb urine dipstick: NEGATIVE
Ketones, ur: NEGATIVE
Leukocytes,Ua: NEGATIVE
Nitrite: NEGATIVE
Specific Gravity, Urine: 1.025 (ref 1.000–1.030)
Total Protein, Urine: 30 — AB
Urine Glucose: NEGATIVE
Urobilinogen, UA: 0.2 (ref 0.0–1.0)
pH: 6 (ref 5.0–8.0)

## 2022-06-13 LAB — IRON,TIBC AND FERRITIN PANEL
%SAT: 39 % (calc) (ref 20–48)
Ferritin: 91 ng/mL (ref 24–380)
Iron: 106 ug/dL (ref 50–180)
TIBC: 271 mcg/dL (calc) (ref 250–425)

## 2022-06-28 DIAGNOSIS — H34832 Tributary (branch) retinal vein occlusion, left eye, with macular edema: Secondary | ICD-10-CM | POA: Diagnosis not present

## 2022-06-28 DIAGNOSIS — H43821 Vitreomacular adhesion, right eye: Secondary | ICD-10-CM | POA: Diagnosis not present

## 2022-07-02 ENCOUNTER — Other Ambulatory Visit: Payer: Self-pay | Admitting: *Deleted

## 2022-07-02 DIAGNOSIS — I6523 Occlusion and stenosis of bilateral carotid arteries: Secondary | ICD-10-CM

## 2022-07-05 ENCOUNTER — Encounter (HOSPITAL_COMMUNITY): Payer: Medicare HMO

## 2022-07-05 ENCOUNTER — Ambulatory Visit: Payer: Medicare HMO

## 2022-07-09 ENCOUNTER — Ambulatory Visit (HOSPITAL_COMMUNITY)
Admission: RE | Admit: 2022-07-09 | Discharge: 2022-07-09 | Disposition: A | Discharge status: Home / Self Care | Payer: Medicare HMO | Source: Ambulatory Visit | Attending: Surgery | Admitting: Surgery

## 2022-07-09 ENCOUNTER — Ambulatory Visit (INDEPENDENT_AMBULATORY_CARE_PROVIDER_SITE_OTHER): Payer: Medicare HMO | Admitting: Physician Assistant

## 2022-07-09 VITALS — BP 155/72 | HR 56 | Temp 97.5°F | Resp 16 | Ht 68.0 in | Wt 185.0 lb

## 2022-07-09 DIAGNOSIS — I6523 Occlusion and stenosis of bilateral carotid arteries: Secondary | ICD-10-CM

## 2022-07-09 NOTE — Progress Notes (Signed)
Established Carotid Patient   History of Present Illness   [name] is a 88 y.o. (07-Mar-1928) male who presents for established carotid artery stenosis follow-up.  He has a history of right carotid endarterectomy with Dr. Maryjean Morn for a TIA.  His neurologic event was described as a brief period of left arm weakness with expressive aphasia and syncope.  He no longer has any neurologic deficits.  At follow-up with [place] today, he is doing well without any new medical issues.  He denies any strokelike symptoms such as slurred speech, sudden changes in vision, sudden weakness or numbness.  He still tries to stay active.  Current Outpatient Medications  Medication Sig Dispense Refill   acetaminophen (TYLENOL) 500 MG tablet Take 500 mg by mouth every 6 (six) hours as needed.     amLODipine (NORVASC) 10 MG tablet TAKE ONE TABLET BY MOUTH EVERY MORNING 90 tablet 1   Carboxymethylcellulose Sod PF 0.5 % SOLN Place 1 drop into both eyes as needed (Dry Eyes).     Cholecalciferol (VITAMIN D3) 2000 units TABS Take 1 tablet by mouth daily.     clopidogrel (PLAVIX) 75 MG tablet TAKE ONE TABLET BY MOUTH EVERY MORNING 90 tablet 1   docusate sodium (COLACE) 100 MG capsule Take 1 capsule (100 mg total) by mouth 2 (two) times daily. 100 capsule 0   furosemide (LASIX) 20 MG tablet TAKE ONE TABLET BY MOUTH ONCE DAILY 30 tablet 3   gabapentin (NEURONTIN) 300 MG capsule Take 1 capsule (300 mg total) by mouth 3 (three) times daily. Take one capsule three times daily 270 capsule 1   losartan (COZAAR) 100 MG tablet TAKE ONE TABLET BY MOUTH ONCE DAILY 90 tablet 1   simvastatin (ZOCOR) 10 MG tablet TAKE ONE TABLET BY MOUTH EVERYDAY AT BEDTIME 90 tablet 1   sodium zirconium cyclosilicate (LOKELMA) 10 g PACK packet Take 10 g by mouth 2 (two) times a week.     vitamin B-12 (CYANOCOBALAMIN) 1000 MCG tablet Take 1,000 mcg by mouth daily.     vitamin C (ASCORBIC ACID) 500 MG tablet Take 500 mg by mouth daily.     No  current facility-administered medications for this visit.    REVIEW OF SYSTEMS (negative unless checked):   Cardiac:  []  Chest pain or chest pressure? []  Shortness of breath upon activity? []  Shortness of breath when lying flat? []  Irregular heart rhythm?  Vascular:  []  Pain in calf, thigh, or hip brought on by walking? []  Pain in feet at night that wakes you up from your sleep? []  Blood clot in your veins? []  Leg swelling?  Pulmonary:  []  Oxygen at home? []  Productive cough? []  Wheezing?  Neurologic:  []  Sudden weakness in arms or legs? []  Sudden numbness in arms or legs? []  Sudden onset of difficult speaking or slurred speech? []  Temporary loss of vision in one eye? []  Problems with dizziness?  Gastrointestinal:  []  Blood in stool? []  Vomited blood?  Genitourinary:  []  Burning when urinating? []  Blood in urine?  Psychiatric:  []  Major depression  Hematologic:  []  Bleeding problems? []  Problems with blood clotting?  Dermatologic:  []  Rashes or ulcers?  Constitutional:  []  Fever or chills?  Ear/Nose/Throat:  []  Change in hearing? []  Nose bleeds? []  Sore throat?  Musculoskeletal:  []  Back pain? []  Joint pain? []  Muscle pain?   Physical Examination   Vitals:   07/09/22 1506 07/09/22 1508  BP: (!) 152/72 (!) 155/72  Pulse: [name])  58 (!) 56  Resp: 16   Temp: (!) 97.5 F (36.4 C)   TempSrc: Temporal   SpO2: 95%   Weight: 185 lb (83.9 kg)   Height: 5\' 8"  (1.727 m)    Body mass index is 28.13 kg/m.  General:  WDWN in NAD; vital signs documented above Gait: Not observed HENT: WNL, normocephalic Pulmonary: normal non-labored breathing , without Rales, rhonchi,  wheezing Cardiac: Regular rate and rhythm Abdomen: soft, NT, no masses Skin: without rashes Vascular Exam/Pulses: 2+ radial pulses bilaterally Extremities: without ischemic changes, without Gangrene , without cellulitis; without open wounds;  Musculoskeletal: no muscle wasting or  atrophy  Neurologic: A&O X 3;  No focal weakness or paresthesias are detected Psychiatric:  The pt has Normal affect.  Non-Invasive Vascular Imaging   B Carotid Duplex (07/09/2022):  Right Carotid: Velocities in the right ICA are consistent with a 1-39%  stenosis.                The extracranial vessels were near-normal with only minimal  wall                thickening or plaque.   Left Carotid: Velocities in the left ICA are consistent with a 40-59%  stenosis.               Non-hemodynamically significant plaque <50% noted in the  CCA.    Medical Decision Making   [name] is a 88 y.o. male who presents for follow-up of established carotid artery stenosis  Based on the patient's vascular studies, his carotid artery stenosis is unchanged.  He has 1 to 39% stenosis of the right ICA.  There is 40 to 59% stenosis of the left ICA He remains asymptomatic for stroke or TIA He will continue to take a statin He can follow-up with [place] in 1 year with repeat bilateral carotid artery duplex study   Vicente Serene PA-C [facility] Office: [facility] MD: Trula Slade

## 2022-07-10 ENCOUNTER — Telehealth: Payer: Self-pay

## 2022-07-10 NOTE — Progress Notes (Signed)
Chronic Care Management Pharmacy Assistant   Name: [name]  MRN: [id number] DOB: [birth year]  Reason for Encounter: Medication Review/Medication Coordination Call.   Recent office visits:  06/12/2022 Dr. Ethelene Hal MD (PCP) No medication Changes noted, return in 3 months  Recent consult visits:  07/09/2022 Vicente Serene PA-C (Vascular and Vein Specialist) No Medication Changes noted  Hospital visits:  None in previous 6 months  Medications: Outpatient Encounter Medications as of 07/10/2022  Medication Sig Note   acetaminophen (TYLENOL) 500 MG tablet Take 500 mg by mouth every 6 (six) hours as needed.    amLODipine (NORVASC) 10 MG tablet TAKE ONE TABLET BY MOUTH EVERY MORNING    Carboxymethylcellulose Sod PF 0.5 % SOLN Place 1 drop into both eyes as needed (Dry Eyes).    Cholecalciferol (VITAMIN D3) 2000 units TABS Take 1 tablet by mouth daily.    clopidogrel (PLAVIX) 75 MG tablet TAKE ONE TABLET BY MOUTH EVERY MORNING    docusate sodium (COLACE) 100 MG capsule Take 1 capsule (100 mg total) by mouth 2 (two) times daily.    furosemide (LASIX) 20 MG tablet TAKE ONE TABLET BY MOUTH ONCE DAILY    gabapentin (NEURONTIN) 300 MG capsule Take 1 capsule (300 mg total) by mouth 3 (three) times daily. Take one capsule three times daily    losartan (COZAAR) 100 MG tablet TAKE ONE TABLET BY MOUTH ONCE DAILY    simvastatin (ZOCOR) 10 MG tablet TAKE ONE TABLET BY MOUTH EVERYDAY AT BEDTIME    sodium zirconium cyclosilicate (LOKELMA) 10 g PACK packet Take 10 g by mouth 2 (two) times a week. 03/13/2022: Only sent to [facility]    vitamin B-12 (CYANOCOBALAMIN) 1000 MCG tablet Take 1,000 mcg by mouth daily.    vitamin C (ASCORBIC ACID) 500 MG tablet Take 500 mg by mouth daily.    No facility-administered encounter medications on file as of 07/10/2022.    Care Gaps: Dtap Vaccine HTN: 144/76 - 06/12/2022   Star Rating Drugs: Simvastatin 25 mg last filled on 06/15/2022 for 30 day supply at  [facility] Losartan 100 mg last filled 06/15/2022 for 30 day supply at [facility].  Reviewed chart for medication changes ahead of medication coordination call.  BP Readings from Last 3 Encounters:  07/09/22 (!) 155/72  06/12/22 (!) 144/76  03/13/22 112/64    Lab Results  Component Value Date   HGBA1C 6.2 01/19/2021     Patient obtains medications through Adherence Packaging  30 Days   Last adherence delivery included:  Clopidogrel 75 mg 1 tablet daily - breakfast Simvastatin 10 mg 1 tablet daily - bedtime Vitamin D3 2000 units 1 tablet daily - breakfast Vitamin C 500 mg 1 tablet daily - breakfast Amlodipine 10 mg 1 tablet daily - breakfast  Losartan 100 mg 1 tablet daily -Breakfast Docusate sodium 100 mg 1 tablet  twice daily- breakfast, evening meal  Vitamin B12 1000 MG daily -Breakfast Furosemide 20 mg 1 tablet daily- Breakfast Systane Eye drops 0.6%- 1 drop into both eye PRN Sodium bicarbonate 650 mg one tablet two times daily- breakfast, evening meals Gabapentin 300 mg Take one capsule each morning and 2 in the evening prior to bedtime.Patient states he want one tablet at Breakfast, one tablet at evening meals , and one tablet at bedtime Lokelma 10 g - one packet twice weekly   Patient declined medication last month: Acetaminophen 500 mg PRN Retaine Eye Drops 0.5% (Adequate supply) Saline Nose Solution - Adequate supply Fluticasone nasal spray  50 mcg 2 sprays in each nostrils every night  Patient is due for next adherence delivery on: 07/20/2022. Called patient and reviewed medications and coordinated delivery.  This delivery to include: Clopidogrel 75 mg 1 tablet daily - breakfast Simvastatin 10 mg 1 tablet daily - bedtime Vitamin D3 2000 units 1 tablet daily - breakfast Vitamin C 500 mg 1 tablet daily - breakfast Amlodipine 10 mg 1 tablet daily - breakfast  Losartan 100 mg 1 tablet daily -Breakfast Docusate sodium 100 mg 1 tablet  twice daily-  breakfast, evening meal  Vitamin B12 1000 MG daily -Breakfast Furosemide 20 mg 1 tablet daily- Breakfast Systane Eye drops 0.6%- 1 drop into both eye PRN Sodium bicarbonate 650 mg one tablet two times daily- breakfast, evening meals Gabapentin 300 mg Take one capsule each morning and 2 in the evening prior to bedtime.Patient states he want one tablet at Breakfast, one tablet at evening meals , and one tablet at bedtime Lokelma 10 g - one packet twice weekly   Patient declined the following medications: Acetaminophen 500 mg PRN Retaine Eye Drops 0.5% (Adequate supply) Saline Nose Solution - Adequate supply Fluticasone nasal spray 50 mcg 2 sprays in each nostrils every night   Patient needs refills for None ID.  Confirmed delivery date of 07/20/2022 ([facility]), advised patient that pharmacy will contact them the morning of delivery.  [place] Pharmacist Assistant [phone]

## 2022-07-12 ENCOUNTER — Ambulatory Visit (INDEPENDENT_AMBULATORY_CARE_PROVIDER_SITE_OTHER): Payer: Medicare HMO

## 2022-07-12 DIAGNOSIS — I10 Essential (primary) hypertension: Secondary | ICD-10-CM

## 2022-07-12 DIAGNOSIS — N184 Chronic kidney disease, stage 4 (severe): Secondary | ICD-10-CM

## 2022-07-12 NOTE — Progress Notes (Signed)
 Chronic Care Management Pharmacy Note  07/13/2022 Name:  [name] MRN:  [id number] DOB:  08/14/1927  Summary: Patient presents for CCM follow-up.   Recommendations/Changes made from today's visit: Continue current medications  Plan: CPP follow-up 6 months  Subjective: [name] is an 88 y.o. year old male who is a primary patient of Kremer, William Alfred, MD.  The CCM team was consulted for assistance with disease management and care coordination needs.    Engaged with patient by telephone for follow up visit in response to provider referral for pharmacy case management and/or care coordination services.   Consent to Services:  The patient was given information about Chronic Care Management services, agreed to services, and gave verbal consent prior to initiation of services.  Please see initial visit note for detailed documentation.   Patient Care Team: Kremer, William Alfred, MD as PCP - General (Family Medicine) ,  A, RPH as Pharmacist (Pharmacist)  Recent office visits: 12/12/21: Patient presented to Dr. Kremer for follow-up. Losartan 100 mg daily.   Recent consult visits: None in past 6 months.   Hospital visits: None in past 6 months.   Objective:  Lab Results  Component Value Date   CREATININE 2.57 (H) 06/12/2022   BUN 36 (H) 06/12/2022   GFR 21.03 (L) 06/12/2022   GFRNONAA 45 (L) 01/27/2019   GFRAA 52 (L) 01/27/2019   NA 136 06/12/2022   K 5.2 (H) 06/12/2022   CALCIUM 8.5 06/12/2022   CO2 23 06/12/2022   GLUCOSE 93 06/12/2022    Lab Results  Component Value Date/Time   HGBA1C 6.2 01/19/2021 11:18 AM   GFR 21.03 (L) 06/12/2022 01:29 PM   GFR 20.39 (L) 03/13/2022 02:36 PM   MICROALBUR 3.5 (H) 08/01/2021 03:09 PM   MICROALBUR 1.2 12/10/2017 10:43 AM    Last diabetic Eye exam: No results found for: "HMDIABEYEEXA"  Last diabetic Foot exam: No results found for: "HMDIABFOOTEX"   Lab Results  Component Value Date   CHOL  136 06/12/2022   HDL 43.70 06/12/2022   LDLCALC 70 06/12/2022   LDLDIRECT 78.0 03/13/2022   TRIG 111.0 06/12/2022   CHOLHDL 3 06/12/2022       Latest Ref Rng & Units 04/21/2021   11:26 AM 01/27/2021   10:27 AM 05/15/2019   11:37 AM  Hepatic Function  Total Protein 6.0 - 8.3 g/dL 7.3  7.1  6.7   Albumin 3.5 - 5.2 g/dL 4.2   4.0   AST 0 - 37 U/L 13   13   ALT 0 - 53 U/L 10   9   Alk Phosphatase 39 - 117 U/L 89   112   Total Bilirubin 0.2 - 1.2 mg/dL 0.7   0.6     Lab Results  Component Value Date/Time   TSH 3.03 03/13/2022 02:36 PM   TSH 2.650 04/04/2018 03:55 PM       Latest Ref Rng & Units 06/12/2022    1:29 PM 03/13/2022    2:36 PM 08/01/2021    3:09 PM  CBC  WBC 4.0 - 10.5 K/uL 8.9  9.9  9.1   Hemoglobin 13.0 - 17.0 g/dL 13.2  12.3  14.0   Hematocrit 39.0 - 52.0 % 41.3  39.5  43.8   Platelets 150.0 - 400.0 K/uL 239.0  258.0  257.0     Lab Results  Component Value Date/Time   VD25OH 61.68 06/12/2022 01:29 PM   VD25OH 52.59 04/21/2021 11:26 AM    Clinical   ASCVD: Yes  The ASCVD Risk score (Arnett DK, et al., 2019) failed to calculate for the following reasons:   The 2019 ASCVD risk score is only valid for ages 40 to 79       06/12/2022    1:04 PM 04/10/2022    2:33 PM 03/13/2022    2:04 PM  Depression screen PHQ 2/9  Decreased Interest 0 0 0  Down, Depressed, Hopeless 0 0 0  PHQ - 2 Score 0 0 0    Social History   Tobacco Use  Smoking Status Never  Smokeless Tobacco Never   BP Readings from Last 3 Encounters:  07/12/22 (!) (P) 172/90  07/09/22 (!) 155/72  06/12/22 (!) 144/76   Pulse Readings from Last 3 Encounters:  07/09/22 (!) 56  06/12/22 (!) 57  03/13/22 (!) 59   Wt Readings from Last 3 Encounters:  07/09/22 185 lb (83.9 kg)  06/12/22 188 lb 3.2 oz (85.4 kg)  04/10/22 180 lb (81.6 kg)   BMI Readings from Last 3 Encounters:  07/09/22 28.13 kg/m  06/12/22 28.62 kg/m  04/10/22 27.37 kg/m    Assessment/Interventions: Review of patient  past medical history, allergies, medications, health status, including review of consultants reports, laboratory and other test data, was performed as part of comprehensive evaluation and provision of chronic care management services.   SDOH:  (Social Determinants of Health) assessments and interventions performed: Yes SDOH Interventions    Flowsheet Row Clinical Support from 04/10/2022 in [facility] Clinical Support from 04/05/2021 in [facility] Chronic Care Management from 10/24/2020 in [facility] Chronic Care Management from 04/25/2020 in [facility]  SDOH Interventions      Food Insecurity Interventions Intervention Not Indicated -- -- --  Housing Interventions Intervention Not Indicated -- -- --  Transportation Interventions Intervention Not Indicated -- Intervention Not Indicated Intervention Not Indicated  Depression Interventions/Treatment  -- PHQ2-9 Score <4 Follow-up Not Indicated -- --  Financial Strain Interventions Intervention Not Indicated -- -- Intervention Not Indicated  Physical Activity Interventions Intervention Not Indicated -- -- --  Stress Interventions Intervention Not Indicated -- -- --  Social Connections Interventions Intervention Not Indicated -- -- --       SDOH Screenings   Food Insecurity: No Food Insecurity (04/10/2022)  Housing: Low Risk  (04/10/2022)  Transportation Needs: No Transportation Needs (04/10/2022)  Utilities: Not At Risk (04/10/2022)  Alcohol Screen: Low Risk  (04/10/2022)  Depression (PHQ2-9): Low Risk  (06/12/2022)  Financial Resource Strain: Low Risk  (04/10/2022)  Physical Activity: Insufficiently Active (04/10/2022)  Social Connections: Socially Isolated (04/10/2022)  Stress: No Stress Concern Present (04/10/2022)  Tobacco Use: Low Risk  (07/09/2022)    CCM Care Plan  Allergies  Allergen Reactions   Clonidine     Other reaction(s): Other (See  Comments) Dry mouth and ineffective per patient.   Codeine Other (See Comments)    No reaction noted   Lisinopril     hyperkalemia   Penicillins     REACTION: rash    Medications Reviewed Today     Reviewed by Cooper, Lillie A, LPN (Licensed Practical Nurse) on 07/09/22 at 1509  Med List Status: <None>   Medication Order Taking? Sig Documenting Provider Last Dose Status Informant  acetaminophen (TYLENOL) 500 MG tablet [id number] Yes Take 500 mg by mouth every 6 (six) hours as needed. [provider] Taking Active   amLODipine (NORVASC) 10 MG tablet [id number] Yes TAKE ONE TABLET BY MOUTH   EVERY MORNING Libby Maw, MD Taking Active   Carboxymethylcellulose Sod PF 0.5 % SOLN [id number] Yes Place 1 drop into both eyes as needed (Dry Eyes). [provider] Taking Active   Cholecalciferol (VITAMIN D3) 2000 units TABS [id number] Yes Take 1 tablet by mouth daily. [provider] Taking Active   clopidogrel (PLAVIX) 75 MG tablet [id number] Yes TAKE ONE TABLET BY MOUTH EVERY MORNING Libby Maw, MD Taking Active   docusate sodium (COLACE) 100 MG capsule [id number] Yes Take 1 capsule (100 mg total) by mouth 2 (two) times daily. Libby Maw, MD Taking Active   furosemide (LASIX) 20 MG tablet [id number] Yes TAKE ONE TABLET BY MOUTH ONCE DAILY Libby Maw, MD Taking Active   gabapentin (NEURONTIN) 300 MG capsule [id number] Yes Take 1 capsule (300 mg total) by mouth 3 (three) times daily. Take one capsule three times daily Libby Maw, MD Taking Active   losartan (COZAAR) 100 MG tablet [id number] Yes TAKE ONE TABLET BY MOUTH ONCE DAILY Libby Maw, MD Taking Active   simvastatin (ZOCOR) 10 MG tablet [id number] Yes TAKE ONE TABLET BY MOUTH EVERYDAY AT BEDTIME Libby Maw, MD Taking Active   sodium zirconium cyclosilicate (LOKELMA) 10 g PACK packet [id number] Yes Take 10 g by mouth 2 (two) times a week. [provider] Taking Active            Med Note Redmond Baseman, TEQUILA   Tue Mar 13, 2022  2:01 PM) Only sent to [facility]   vitamin B-12 (CYANOCOBALAMIN) 1000 MCG tablet [id number] Yes Take 1,000 mcg by mouth daily. [provider] Taking Active   vitamin C (ASCORBIC ACID) 500 MG tablet [id number] Yes Take 500 mg by mouth daily. [provider] Taking Active             Patient Active Problem List   Diagnosis Date Noted   Stage 4 chronic kidney disease ([place]) 03/13/2022   Other fatigue 03/13/2022   Elevated glucose 01/19/2021   Vitamin B 12 deficiency 01/19/2021   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   Fall 10/18/2020   Fibrotic lung diseases ([place]) 10/18/2020   Slow transit constipation 04/05/2020   Near syncope 12/24/2019   Abnormal CT of the chest 05/22/2019   Vitamin D deficiency 05/22/2019   Edema 01/27/2019   Bilateral rales 01/27/2019   Weight gain 01/01/2019   Elevated cholesterol 05/19/2018   Hyperkalemia 04/04/2018   Bradycardia 03/17/2018   PND (post-nasal drip) 12/10/2017   Neuropathy 12/05/2017   Transient cerebral ischemia 10/06/2007   Carotid disease, bilateral ([place]) 01/27/2007   PAIN IN JOINT, MULTIPLE SITES 01/09/2007   BRANCH RETINAL VEIN OCCLUSION 11/19/2006   Hearing loss 11/19/2006   Essential hypertension 11/19/2006   PEPTIC ULCER DISEASE 11/19/2006   HEADACHE 11/19/2006    Immunization History  Administered Date(s) Administered   Fluad Quad(high Dose 65+) 04/15/2019   Influenza Whole 06/19/2007, 05/10/2022   Influenza, High Dose Seasonal PF 05/02/2018   Influenza-Unspecified 05/29/2016, 05/13/2017, 05/28/2020, 06/05/2021   Moderna Sars-Covid-2 Vaccination 09/30/2019, 10/28/2019, 05/28/2020, 06/05/2021   Pneumococcal Conjugate-13 07/31/2015   Pneumococcal Polysaccharide-23 07/31/2015, 10/24/2016   Zoster Recombinat (Shingrix) 04/26/2020, 07/29/2020    Conditions to be addressed/monitored:  Hypertension, Hyperlipidemia, Chronic Kidney Disease and  History of TIA   Care Plan : General Pharmacy (Adult)  Updates made by Germaine Pomfret, [place] since 07/13/2022 12:00 AM     Problem: Hypertension, Hyperlipidemia, Chronic Kidney Disease and History of TIA   Priority: High     Long-Range  Goal: Patient-Specific Goal   Start Date: 10/24/2020  Expected End Date: 01/23/2023  This Visit's Progress: On track  Recent Progress: On track  Priority: High  Note:   Current Barriers:  No Barriers Noted  Pharmacist Clinical Goal(s):  Patient will maintain control of blood pressure as evidenced by BP less than 140/90  through collaboration with PharmD and provider.   Interventions: 1:1 collaboration with Libby Maw, MD regarding development and update of comprehensive plan of care as evidenced by provider attestation and co-signature Inter-disciplinary care team collaboration (see longitudinal plan of care) Comprehensive medication review performed; medication list updated in electronic medical record  Hypertension (BP goal <140/90) -Controlled -Current treatment: Amlodipine 10 mg daily Furosemide 20 mg daily   Losartan 100 mg daily  -Medications previously tried: NA  -Current home readings: 140/70, 172/90.  -Current dietary habits: Tries to minimize salt intake -Current exercise habits: Tries to walk daily -Denies hypotensive/hypertensive symptoms -Recommended to continue current medication   Hyperlipidemia: (LDL goal < 100) -Controlled -Current treatment: Simvastatin 10 mg daily  -Current antiplatelet treatment: Clopidogrel 75 mg daily   -Medications previously tried: NA  -Educated on Importance of limiting foods high in cholesterol; -Recommended to continue current medication  Chronic Kidney Disease Stage 4  -All medications assessed for renal dosing and appropriateness in chronic kidney disease. -Recommended to continue current medication  Patient Goals/Self-Care Activities Patient will:  - check blood  pressure weekly, document, and provide at future appointments increase water intake (goal 60 oz daily)  Follow Up Plan: Telephone follow up appointment with care management team member scheduled for:  10/18/2022 at 2:00 PM    Medication Assistance: None required.  Patient affirms current coverage meets needs.  Patient's preferred pharmacy is:  [facility] - [place], [place] - [address] [place] [address] Phone: [phone] Fax: [phone]  Patient decided to: Utilize [facility] for medication synchronization, packaging and delivery   Care Plan and Follow Up Patient Decision:  Patient agrees to Care Plan and Follow-up.  Plan: Telephone follow up appointment with care management team member scheduled for:  10/18/2022 at 2:00 PM  [name], PharmD, CPP Clinical Pharmacist [facility]  [phone]

## 2022-07-25 ENCOUNTER — Telehealth: Payer: Self-pay | Admitting: Family Medicine

## 2022-07-25 NOTE — Telephone Encounter (Signed)
Spoke with patient who states that he just tested positive for covid this afternoon. This have been through the whole family patient have not had any symptoms just a minor cough that come and go. Patient not sure if he will need medications for this. Advised patient that since there are no symptoms there was no need for Rx due to medication only helping the severity of symptoms since patient have no symptoms hot tea and honey for little cough and call back if symptoms dose not subside? Any recommendations?

## 2022-07-25 NOTE — Telephone Encounter (Signed)
Caller Name: [name] Ackerley Call back phone #: [phone]  Reason for Call: Pt tested positive for covid after spending time with grandchildren. He does not feel any effect from it, but wants to still ask for any recommendations. No aches, no sick feeling

## 2022-07-26 ENCOUNTER — Telehealth (INDEPENDENT_AMBULATORY_CARE_PROVIDER_SITE_OTHER): Payer: Medicare HMO | Admitting: Nurse Practitioner

## 2022-07-26 ENCOUNTER — Encounter: Payer: Self-pay | Admitting: Nurse Practitioner

## 2022-07-26 DIAGNOSIS — U071 COVID-19: Secondary | ICD-10-CM | POA: Insufficient documentation

## 2022-07-26 MED ORDER — MOLNUPIRAVIR EUA 200MG CAPSULE
4.0000 | ORAL_CAPSULE | Freq: Two times a day (BID) | ORAL | 0 refills | Status: AC
Start: 2022-07-26 — End: 2022-07-31

## 2022-07-26 NOTE — Assessment & Plan Note (Addendum)
With medical history and age, will treat with molnupiravir twice a day for 5 days. He has CKD stage 4 with GFR 21, unable to take paxlovid. Encouraged rest and fluids.  Reviewed home care instructions for COVID. Advised self-isolation at home for at least 5 days. After 5 days, if improved and fever resolved, can be in public, but should wear a mask around others for an additional 5 days. If symptoms, esp, dyspnea develops/worsens, recommend in-person evaluation at either an urgent care or the emergency room.

## 2022-07-26 NOTE — Progress Notes (Signed)
[facility] [address] [place] [address] Dept: [phone] Dept Fax: [phone]  Virtual Video Visit  I connected with [name] on 07/26/22 at 11:00 AM EST by a video enabled telemedicine application and verified that I am speaking with the correct person using two identifiers.  Location patient: Home Location provider: Clinic Persons participating in the virtual visit: Patient; Vance Peper, NP; Bynum Bellows, CMA  I discussed the limitations of evaluation and management by telemedicine and the availability of in person appointments. The patient expressed understanding and agreed to proceed.  Chief Complaint  Patient presents with   Acute Visit    Tested POS COVID, no extreme symptoms     SUBJECTIVE:  HPI: [name] is a 88 y.o. male who presents with a slight scratchy throat, nasal congestion, and cough with a positive home covid-19 test yesterday.   UPPER RESPIRATORY TRACT INFECTION  Fever: no Cough: yes - slight Shortness of breath: no Wheezing: no Chest pain: no Chest tightness: no Chest congestion: no Nasal congestion: yes - slight  Runny nose: no Post nasal drip: no Sneezing: no Sore throat:  scratchy  Swollen glands: no Sinus pressure: no Headache: no Face pain: no Toothache: no Ear pain: no bilateral Ear pressure: no bilateral Eyes red/itching:no Eye drainage/crusting: no  Vomiting: no Rash: no Fatigue: no Sick contacts: yes - family has covid-19 Strep contacts: no  Context: stable Recurrent sinusitis: no Relief with OTC cold/cough medications: yes  Treatments attempted: tylenol   Patient Active Problem List   Diagnosis Date Noted   COVID-19 07/26/2022   Stage 4 chronic kidney disease ([place]) 03/13/2022   Other fatigue 03/13/2022   Elevated glucose 01/19/2021   Vitamin B 12 deficiency 01/19/2021   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   Fall 10/18/2020   Fibrotic lung diseases  ([place]) 10/18/2020   Slow transit constipation 04/05/2020   Near syncope 12/24/2019   Abnormal CT of the chest 05/22/2019   Vitamin D deficiency 05/22/2019   Edema 01/27/2019   Bilateral rales 01/27/2019   Weight gain 01/01/2019   Elevated cholesterol 05/19/2018   Hyperkalemia 04/04/2018   Bradycardia 03/17/2018   PND (post-nasal drip) 12/10/2017   Neuropathy 12/05/2017   Transient cerebral ischemia 10/06/2007   Carotid disease, bilateral ([place]) 01/27/2007   PAIN IN JOINT, MULTIPLE SITES 01/09/2007   BRANCH RETINAL VEIN OCCLUSION 11/19/2006   Hearing loss 11/19/2006   Essential hypertension 11/19/2006   PEPTIC ULCER DISEASE 11/19/2006   HEADACHE 11/19/2006    Past Surgical History:  Procedure Laterality Date   CAROTID ENDARTERECTOMY Right    HERNIA REPAIR     INNER EAR SURGERY Right    for hearing loss    Family History  Problem Relation Age of Onset   Lung cancer Father     Social History   Tobacco Use   Smoking status: Never   Smokeless tobacco: Never  Vaping Use   Vaping Use: Never used  Substance Use Topics   Alcohol use: Yes    Comment: 1 glass wine dialy   Drug use: Never     Current Outpatient Medications:    acetaminophen (TYLENOL) 500 MG tablet, Take 500 mg by mouth every 6 (six) hours as needed., Disp: , Rfl:    amLODipine (NORVASC) 10 MG tablet, TAKE ONE TABLET BY MOUTH EVERY MORNING, Disp: 90 tablet, Rfl: 1   Carboxymethylcellulose Sod PF 0.5 % SOLN, Place 1 drop into both eyes as needed (Dry Eyes)., Disp: , Rfl:  Cholecalciferol (VITAMIN D3) 2000 units TABS, Take 1 tablet by mouth daily., Disp: , Rfl:    clopidogrel (PLAVIX) 75 MG tablet, TAKE ONE TABLET BY MOUTH EVERY MORNING, Disp: 90 tablet, Rfl: 1   docusate sodium (COLACE) 100 MG capsule, Take 1 capsule (100 mg total) by mouth 2 (two) times daily., Disp: 100 capsule, Rfl: 0   furosemide (LASIX) 20 MG tablet, TAKE ONE TABLET BY MOUTH ONCE DAILY, Disp: 30 tablet, Rfl: 3   gabapentin (NEURONTIN)  300 MG capsule, Take 1 capsule (300 mg total) by mouth 3 (three) times daily. Take one capsule three times daily, Disp: 270 capsule, Rfl: 1   losartan (COZAAR) 100 MG tablet, TAKE ONE TABLET BY MOUTH ONCE DAILY, Disp: 90 tablet, Rfl: 1   molnupiravir EUA (LAGEVRIO) 200 mg CAPS capsule, Take 4 capsules (800 mg total) by mouth 2 (two) times daily for 5 days., Disp: 40 capsule, Rfl: 0   simvastatin (ZOCOR) 10 MG tablet, TAKE ONE TABLET BY MOUTH EVERYDAY AT BEDTIME, Disp: 90 tablet, Rfl: 1   sodium zirconium cyclosilicate (LOKELMA) 10 g PACK packet, Take 10 g by mouth 2 (two) times a week., Disp: , Rfl:    vitamin B-12 (CYANOCOBALAMIN) 1000 MCG tablet, Take 1,000 mcg by mouth daily., Disp: , Rfl:    vitamin C (ASCORBIC ACID) 500 MG tablet, Take 500 mg by mouth daily., Disp: , Rfl:   Allergies  Allergen Reactions   Clonidine     Other reaction(s): Other (See Comments) Dry mouth and ineffective per patient.   Codeine Other (See Comments)    No reaction noted   Lisinopril     hyperkalemia   Penicillins     REACTION: rash    ROS: See pertinent positives and negatives per HPI.  OBSERVATIONS/OBJECTIVE:  VITALS per patient if applicable: There were no vitals filed for this visit. There is no height or weight on file to calculate BMI.    GENERAL: Alert and oriented. Appears well and in no acute distress.  HEENT: Atraumatic. Conjunctiva clear. No obvious abnormalities on inspection of external nose and ears.  NECK: Normal movements of the head and neck.  LUNGS: On inspection, no signs of respiratory distress. Breathing rate appears normal. No obvious gross SOB, gasping or wheezing, and no conversational dyspnea.  CV: No obvious cyanosis.  MS: Moves all visible extremities without noticeable abnormality.  PSYCH/NEURO: Pleasant and cooperative. No obvious depression or anxiety. Speech and thought processing grossly intact.  ASSESSMENT AND PLAN:  Problem List Items Addressed This Visit        Other   COVID-19 - Primary    With medical history and age, will treat with molnupiravir twice a day for 5 days. He has CKD stage 4 with GFR 21, unable to take paxlovid. Encouraged rest and fluids.  Reviewed home care instructions for COVID. Advised self-isolation at home for at least 5 days. After 5 days, if improved and fever resolved, can be in public, but should wear a mask around others for an additional 5 days. If symptoms, esp, dyspnea develops/worsens, recommend in-person evaluation at either an urgent care or the emergency room.       Relevant Medications   molnupiravir EUA (LAGEVRIO) 200 mg CAPS capsule     I discussed the assessment and treatment plan with the patient. The patient was provided an opportunity to ask questions and all were answered. The patient agreed with the plan and demonstrated an understanding of the instructions.   The patient was advised to  call back or seek an in-person evaluation if the symptoms worsen or if the condition fails to improve as anticipated.   Charyl Dancer, NP

## 2022-07-26 NOTE — Telephone Encounter (Signed)
Patient scheduled.

## 2022-07-29 DIAGNOSIS — I10 Essential (primary) hypertension: Secondary | ICD-10-CM

## 2022-07-29 DIAGNOSIS — N184 Chronic kidney disease, stage 4 (severe): Secondary | ICD-10-CM

## 2022-08-08 DIAGNOSIS — N184 Chronic kidney disease, stage 4 (severe): Secondary | ICD-10-CM | POA: Diagnosis not present

## 2022-08-09 ENCOUNTER — Telehealth: Payer: Self-pay

## 2022-08-09 DIAGNOSIS — N184 Chronic kidney disease, stage 4 (severe): Secondary | ICD-10-CM

## 2022-08-09 DIAGNOSIS — I10 Essential (primary) hypertension: Secondary | ICD-10-CM

## 2022-08-09 NOTE — Progress Notes (Signed)
Care Management & Coordination Services Pharmacy Team  Reason for Encounter: Medication coordination and delivery  Contacted patient on 08/09/2022 to discuss medications   Recent office visits:  07/26/2022 Vance Peper NP (PCP) start molnupiravir EUA (LAGEVRIO) 200 mg CAPS capsule for 5 days  Recent consult visits:  None ID  Hospital visits:  None in previous 6 months  Medications: Outpatient Encounter Medications as of 08/09/2022  Medication Sig Note   acetaminophen (TYLENOL) 500 MG tablet Take 500 mg by mouth every 6 (six) hours as needed.    amLODipine (NORVASC) 10 MG tablet TAKE ONE TABLET BY MOUTH EVERY MORNING    Carboxymethylcellulose Sod PF 0.5 % SOLN Place 1 drop into both eyes as needed (Dry Eyes).    Cholecalciferol (VITAMIN D3) 2000 units TABS Take 1 tablet by mouth daily.    clopidogrel (PLAVIX) 75 MG tablet TAKE ONE TABLET BY MOUTH EVERY MORNING    docusate sodium (COLACE) 100 MG capsule Take 1 capsule (100 mg total) by mouth 2 (two) times daily.    furosemide (LASIX) 20 MG tablet TAKE ONE TABLET BY MOUTH ONCE DAILY    gabapentin (NEURONTIN) 300 MG capsule Take 1 capsule (300 mg total) by mouth 3 (three) times daily. Take one capsule three times daily    losartan (COZAAR) 100 MG tablet TAKE ONE TABLET BY MOUTH ONCE DAILY    simvastatin (ZOCOR) 10 MG tablet TAKE ONE TABLET BY MOUTH EVERYDAY AT BEDTIME    sodium zirconium cyclosilicate (LOKELMA) 10 g PACK packet Take 10 g by mouth 2 (two) times a week. 03/13/2022: Only sent to [facility]    vitamin B-12 (CYANOCOBALAMIN) 1000 MCG tablet Take 1,000 mcg by mouth daily.    vitamin C (ASCORBIC ACID) 500 MG tablet Take 500 mg by mouth daily.    No facility-administered encounter medications on file as of 08/09/2022.   BP Readings from Last 3 Encounters:  07/12/22 (!) (P) 172/90  07/09/22 (!) 155/72  06/12/22 (!) 144/76    Pulse Readings from Last 3 Encounters:  07/09/22 (!) 56  06/12/22 (!) 57  03/13/22 (!) 59    Lab  Results  Component Value Date/Time   HGBA1C 6.2 01/19/2021 11:18 AM   Lab Results  Component Value Date   CREATININE 2.57 (H) 06/12/2022   BUN 36 (H) 06/12/2022   GFR 21.03 (L) 06/12/2022   GFRNONAA 45 (L) 01/27/2019   GFRAA 52 (L) 01/27/2019   NA 136 06/12/2022   K 5.2 (H) 06/12/2022   CALCIUM 8.5 06/12/2022   CO2 23 06/12/2022   Care Gaps: Dtap Vaccine HTN: 144/76 - 06/12/2022   Star Rating Drugs: Simvastatin 25 mg last filled on 07/17/2022 for 30 day supply at [facility] Losartan 100 mg last filled 07/17/2022 for 30 day supply at [facility].  Last adherence delivery date:07/20/2022      Patient is due for next adherence delivery on: 08/21/2022  Multiple attempts made to reach patient. Unsuccessful outreach. Will refill based off of last adherence fill.   This delivery to include: Adherence Packaging  30 Days  Clopidogrel 75 mg 1 tablet daily - breakfast Simvastatin 10 mg 1 tablet daily - bedtime Vitamin D3 2000 units 1 tablet daily - breakfast Vitamin C 500 mg 1 tablet daily - breakfast Amlodipine 10 mg 1 tablet daily - breakfast  Losartan 100 mg 1 tablet daily -Breakfast Docusate sodium 100 mg 1 tablet  twice daily- breakfast, evening meal  Vitamin B12 1000 MG daily -Breakfast Furosemide 20 mg 1 tablet daily-  Breakfast Systane Eye drops 0.6%- 1 drop into both eye PRN Sodium bicarbonate 650 mg one tablet two times daily- breakfast, evening meals Gabapentin 300 mg Take one capsule each morning and 2 in the evening prior to bedtime.Patient states he want one tablet at Breakfast, one tablet at evening meals , and one tablet at bedtime Lokelma 10 g - one packet twice weekly   Patient declined the following medications this month: Acetaminophen 500 mg PRN Retaine Eye Drops 0.5% (Adequate supply) Saline Nose Solution - Adequate supply Fluticasone nasal spray 50 mcg 2 sprays in each nostrils every night  Refills requested from providers include:Furosemide  prescribe by PCP.  Delivery scheduled for 08/21/2022. Unable to speak with patient to confirm date.    Cycle dispensing form sent to Daron Offer for review.   [place] Pharmacist Assistant [phone]

## 2022-08-13 DIAGNOSIS — I129 Hypertensive chronic kidney disease with stage 1 through stage 4 chronic kidney disease, or unspecified chronic kidney disease: Secondary | ICD-10-CM | POA: Diagnosis not present

## 2022-08-13 DIAGNOSIS — N184 Chronic kidney disease, stage 4 (severe): Secondary | ICD-10-CM | POA: Diagnosis not present

## 2022-08-13 DIAGNOSIS — N2581 Secondary hyperparathyroidism of renal origin: Secondary | ICD-10-CM | POA: Diagnosis not present

## 2022-08-13 DIAGNOSIS — D631 Anemia in chronic kidney disease: Secondary | ICD-10-CM | POA: Diagnosis not present

## 2022-08-13 DIAGNOSIS — E875 Hyperkalemia: Secondary | ICD-10-CM | POA: Diagnosis not present

## 2022-08-13 DIAGNOSIS — E872 Acidosis, unspecified: Secondary | ICD-10-CM | POA: Diagnosis not present

## 2022-08-15 MED ORDER — FUROSEMIDE 20 MG PO TABS: 20.0000 mg | ORAL_TABLET | Freq: Every day | ORAL | 1 refills | Status: DC

## 2022-08-15 NOTE — Addendum Note (Signed)
Addended by: Daron Offer A on: 08/15/2022 09:25 AM   Modules accepted: Orders

## 2022-09-03 DIAGNOSIS — Z961 Presence of intraocular lens: Secondary | ICD-10-CM | POA: Diagnosis not present

## 2022-09-11 ENCOUNTER — Ambulatory Visit: Payer: Medicare Other | Admitting: Family Medicine

## 2022-09-11 ENCOUNTER — Telehealth: Payer: Self-pay | Admitting: Family Medicine

## 2022-09-11 ENCOUNTER — Telehealth: Payer: Self-pay

## 2022-09-11 NOTE — Progress Notes (Signed)
   Care Guide Note  09/11/2022 Name: [name] MRN: [id number] DOB: 1928/07/03  Referred by: Libby Maw, MD Reason for referral : Care Coordination (Outreach to schedule with Pharm d transition to [facility] pharm d )   [name] is a 89 y.o. year old male who is a primary care patient of Libby Maw, MD. [name] was referred to the pharmacist for assistance related to DM.    An unsuccessful telephone outreach was attempted today to contact the patient who was referred to the pharmacy team for assistance with medication management. Additional attempts will be made to contact the patient.   Noreene Larsson, [place], [place] [address] Direct Dial: [phone] [email or web address]

## 2022-09-11 NOTE — Progress Notes (Signed)
   Care Guide Note  09/11/2022 Name: [name] MRN: [id number] DOB: [birth year]  Referred by: Libby Maw, MD Reason for referral : Care Coordination (Outreach to schedule with Pharm d transition to [facility] pharm d )   [name] is a 89 y.o. year old male who is a primary care patient of Libby Maw, MD. [name] was referred to the pharmacist for assistance related to DM.    Successful contact was made with the patient to discuss pharmacy services including being ready for the pharmacist to call at least 5 minutes before the scheduled appointment time, to have medication bottles and any blood sugar or blood pressure readings ready for review. The patient agreed to meet with the pharmacist via with the pharmacist via telephone visit on (date/time).  10/08/2022  Noreene Larsson, [place], [place] [address] Direct Dial: [phone] [email or web address]

## 2022-09-11 NOTE — Telephone Encounter (Signed)
Pt was a no show for an OV with Dr Ethelene Hal on 09/11/22, I sent a letter.

## 2022-09-13 ENCOUNTER — Encounter: Payer: Self-pay | Admitting: Family Medicine

## 2022-09-13 ENCOUNTER — Ambulatory Visit (INDEPENDENT_AMBULATORY_CARE_PROVIDER_SITE_OTHER): Payer: Medicare Other | Admitting: Family Medicine

## 2022-09-13 VITALS — BP 115/66 | HR 64 | Temp 98.4°F | Ht 68.0 in | Wt 184.0 lb

## 2022-09-13 DIAGNOSIS — I10 Essential (primary) hypertension: Secondary | ICD-10-CM

## 2022-09-13 DIAGNOSIS — J8410 Pulmonary fibrosis, unspecified: Secondary | ICD-10-CM

## 2022-09-13 DIAGNOSIS — N184 Chronic kidney disease, stage 4 (severe): Secondary | ICD-10-CM | POA: Diagnosis not present

## 2022-09-13 DIAGNOSIS — J22 Unspecified acute lower respiratory infection: Secondary | ICD-10-CM

## 2022-09-13 DIAGNOSIS — J4520 Mild intermittent asthma, uncomplicated: Secondary | ICD-10-CM

## 2022-09-13 DIAGNOSIS — J45909 Unspecified asthma, uncomplicated: Secondary | ICD-10-CM | POA: Insufficient documentation

## 2022-09-13 MED ORDER — DM-GUAIFENESIN ER 30-600 MG PO TB12: 1.0000 | ORAL_TABLET | Freq: Two times a day (BID) | ORAL | 0 refills | Status: DC | PRN

## 2022-09-13 MED ORDER — PREDNISONE 10 MG PO TABS: 10.0000 mg | ORAL_TABLET | Freq: Two times a day (BID) | ORAL | 0 refills | Status: AC

## 2022-09-13 MED ORDER — AZITHROMYCIN 250 MG PO TABS: ORAL_TABLET | ORAL | 0 refills | Status: AC

## 2022-09-13 NOTE — Progress Notes (Signed)
Established Patient Office Visit   Subjective:  Patient ID: [name], male    DOB: 12/09/1927  Age: 89 y.o. MRN: [id number]  Chief Complaint  Patient presents with   Medical Management of Chronic Issues    3 month follow up patient had covid over the holidays 2 months ago still SOB with a cough throat little irritating, mucus feels stuck. Patient not fasting.     HPI Encounter Diagnoses  Name Primary?   Essential hypertension Yes   Stage 4 chronic kidney disease (HCC)    Lower resp. tract infection    Mild intermittent reactive airway disease without complication    Fibrotic lung diseases ([place])    For follow-up of above.  Blood pressure has been well-controlled on current regimen.  He has seen nephrology for the CKD and no changes in therapy were recommended.  Reports a persisting cough status post COVID infection just before Christmas of this past year.  Cough is intermittently productive of yellow phlegm.  There has been some tightness and wheezing in the chest.  He has no asthma or tobacco history.  Energy levels have been down.  History of fibrotic lung disease.   Review of Systems  Constitutional:  Positive for malaise/fatigue.  HENT: Negative.    Eyes:  Negative for blurred vision, discharge and redness.  Respiratory:  Positive for cough, sputum production and wheezing. Negative for shortness of breath.   Cardiovascular: Negative.   Gastrointestinal:  Negative for abdominal pain.  Genitourinary: Negative.   Musculoskeletal: Negative.  Negative for myalgias.  Skin:  Negative for rash.  Neurological:  Negative for tingling, loss of consciousness and weakness.  Endo/Heme/Allergies:  Negative for polydipsia.     Current Outpatient Medications:    acetaminophen (TYLENOL) 500 MG tablet, Take 500 mg by mouth every 6 (six) hours as needed., Disp: , Rfl:    amLODipine (NORVASC) 10 MG tablet, TAKE ONE TABLET BY MOUTH EVERY MORNING, Disp: 90 tablet, Rfl: 1   azithromycin  (ZITHROMAX) 250 MG tablet, Take 2 tablets on day 1, then 1 tablet daily on days 2 through 5, Disp: 6 tablet, Rfl: 0   Carboxymethylcellulose Sod PF 0.5 % SOLN, Place 1 drop into both eyes as needed (Dry Eyes)., Disp: , Rfl:    Cholecalciferol (VITAMIN D3) 2000 units TABS, Take 1 tablet by mouth daily., Disp: , Rfl:    clopidogrel (PLAVIX) 75 MG tablet, TAKE ONE TABLET BY MOUTH EVERY MORNING, Disp: 90 tablet, Rfl: 1   dextromethorphan-guaiFENesin (MUCINEX DM) 30-600 MG 12hr tablet, Take 1 tablet by mouth 2 (two) times daily as needed for cough. With a tall glass of water, Disp: 20 tablet, Rfl: 0   docusate sodium (COLACE) 100 MG capsule, Take 1 capsule (100 mg total) by mouth 2 (two) times daily., Disp: 100 capsule, Rfl: 0   furosemide (LASIX) 20 MG tablet, Take 1 tablet (20 mg total) by mouth daily., Disp: 90 tablet, Rfl: 1   gabapentin (NEURONTIN) 300 MG capsule, Take 1 capsule (300 mg total) by mouth 3 (three) times daily. Take one capsule three times daily, Disp: 270 capsule, Rfl: 1   losartan (COZAAR) 100 MG tablet, TAKE ONE TABLET BY MOUTH ONCE DAILY, Disp: 90 tablet, Rfl: 1   predniSONE (DELTASONE) 10 MG tablet, Take 1 tablet (10 mg total) by mouth 2 (two) times daily with a meal for 5 days., Disp: 10 tablet, Rfl: 0   simvastatin (ZOCOR) 10 MG tablet, TAKE ONE TABLET BY MOUTH EVERYDAY AT BEDTIME, Disp:  90 tablet, Rfl: 1   sodium zirconium cyclosilicate (LOKELMA) 10 g PACK packet, Take 10 g by mouth 2 (two) times a week., Disp: , Rfl:    vitamin B-12 (CYANOCOBALAMIN) 1000 MCG tablet, Take 1,000 mcg by mouth daily., Disp: , Rfl:    vitamin C (ASCORBIC ACID) 500 MG tablet, Take 500 mg by mouth daily., Disp: , Rfl:    Objective:     BP 115/66 (BP Location: Right Arm, Patient Position: Sitting, Cuff Size: Normal)   Pulse 64   Temp 98.4 F (36.9 C) (Temporal)   Ht 5' 8"$  (1.727 m)   Wt 184 lb (83.5 kg)   SpO2 93%   BMI 27.98 kg/m  Wt Readings from Last 3 Encounters:  09/13/22 184 lb (83.5  kg)  07/09/22 185 lb (83.9 kg)  06/12/22 188 lb 3.2 oz (85.4 kg)      Physical Exam Constitutional:      General: He is not in acute distress.    Appearance: Normal appearance. He is not ill-appearing, toxic-appearing or diaphoretic.  HENT:     Head: Normocephalic and atraumatic.     Right Ear: External ear normal.     Left Ear: External ear normal.  Eyes:     General: No scleral icterus.       Right eye: No discharge.        Left eye: No discharge.     Extraocular Movements: Extraocular movements intact.     Conjunctiva/sclera: Conjunctivae normal.  Cardiovascular:     Rate and Rhythm: Normal rate and regular rhythm.  Pulmonary:     Effort: Pulmonary effort is normal. No respiratory distress.     Breath sounds: Rhonchi (at bases as noted on prior exams. H/o fibrotic lung dz.) present. No wheezing or rales.  Abdominal:     General: Bowel sounds are normal.     Tenderness: There is no abdominal tenderness. There is no guarding.  Musculoskeletal:     Cervical back: No rigidity or tenderness.  Skin:    General: Skin is warm and dry.  Neurological:     Mental Status: He is alert and oriented to person, place, and time.  Psychiatric:        Mood and Affect: Mood normal.        Behavior: Behavior normal.      No results found for any visits on 09/13/22.    The ASCVD Risk score (Arnett DK, et al., 2019) failed to calculate for the following reasons:   The 2019 ASCVD risk score is only valid for ages 10 to 16    Assessment & Plan:   Essential hypertension -     Basic metabolic panel  Stage 4 chronic kidney disease (HCC) -     Basic metabolic panel  Lower resp. tract infection -     Azithromycin; Take 2 tablets on day 1, then 1 tablet daily on days 2 through 5  Dispense: 6 tablet; Refill: 0 -     DM-guaiFENesin ER; Take 1 tablet by mouth 2 (two) times daily as needed for cough. With a tall glass of water  Dispense: 20 tablet; Refill: 0  Mild intermittent reactive  airway disease without complication -     predniSONE; Take 1 tablet (10 mg total) by mouth 2 (two) times daily with a meal for 5 days.  Dispense: 10 tablet; Refill: 0  Fibrotic lung diseases ([place])    Return in about 3 months (around 12/12/2022), or if symptoms worsen or fail  to improve.    Libby Maw, MD

## 2022-09-14 LAB — BASIC METABOLIC PANEL
BUN: 41 mg/dL — ABNORMAL HIGH (ref 6–23)
CO2: 25 mEq/L (ref 19–32)
Calcium: 8.9 mg/dL (ref 8.4–10.5)
Chloride: 103 mEq/L (ref 96–112)
Creatinine, Ser: 2.61 mg/dL — ABNORMAL HIGH (ref 0.40–1.50)
GFR: 20.6 mL/min — ABNORMAL LOW (ref >60.00)
Glucose, Bld: 87 mg/dL (ref 70–99)
Potassium: 5.4 mEq/L — ABNORMAL HIGH (ref 3.5–5.1)
Sodium: 138 mEq/L (ref 135–145)

## 2022-09-14 NOTE — Telephone Encounter (Signed)
Late arrival/pt rescheduled for 2/15

## 2022-09-18 ENCOUNTER — Other Ambulatory Visit: Payer: Self-pay | Admitting: Family Medicine

## 2022-09-25 ENCOUNTER — Other Ambulatory Visit: Payer: Self-pay

## 2022-09-25 MED ORDER — LOKELMA 10 G PO PACK: PACK | ORAL | 3 refills | Status: DC

## 2022-09-25 MED ORDER — SODIUM BICARBONATE 650 MG PO TABS: 650.0000 mg | ORAL_TABLET | Freq: Two times a day (BID) | ORAL | 3 refills | Status: DC

## 2022-10-05 ENCOUNTER — Ambulatory Visit (INDEPENDENT_AMBULATORY_CARE_PROVIDER_SITE_OTHER): Payer: Medicare Other | Admitting: Family Medicine

## 2022-10-05 ENCOUNTER — Encounter: Payer: Self-pay | Admitting: Family Medicine

## 2022-10-05 VITALS — BP 130/60 | HR 66 | Temp 98.0°F | Ht 68.0 in | Wt 177.8 lb

## 2022-10-05 DIAGNOSIS — J8410 Pulmonary fibrosis, unspecified: Secondary | ICD-10-CM | POA: Diagnosis not present

## 2022-10-05 DIAGNOSIS — E875 Hyperkalemia: Secondary | ICD-10-CM

## 2022-10-05 DIAGNOSIS — R0683 Snoring: Secondary | ICD-10-CM | POA: Diagnosis not present

## 2022-10-05 NOTE — Progress Notes (Signed)
Established Patient Office Visit   Subjective:  Patient ID: [name], male    DOB: 09/02/1927  Age: 89 y.o. MRN: [id number]  Chief Complaint  Patient presents with   Hypertension   Chronic Kidney Disease    Hypertension Pertinent negatives include no blurred vision.   Encounter Diagnoses  Name Primary?   Fibrotic lung diseases (HCC) Yes   Hyperkalemia    Snores    For follow-up of hypotension and hyperkalemia.  He is accompanied by his daughter.  She expresses concern about apnea.  She says he snores and gasps for breath throughout the night.  He says that he feels rested in the morning after sleeping.  He does have a history of pulmonary fibrosis and has had a pulmonary consultation in the distant past.  He has done well off of the losartan.   Review of Systems  Constitutional: Negative.   HENT: Negative.    Eyes:  Negative for blurred vision, discharge and redness.  Respiratory: Negative.    Cardiovascular: Negative.   Gastrointestinal:  Negative for abdominal pain.  Genitourinary: Negative.   Musculoskeletal: Negative.  Negative for myalgias.  Skin:  Negative for rash.  Neurological:  Negative for tingling, loss of consciousness and weakness.  Endo/Heme/Allergies:  Negative for polydipsia.     Current Outpatient Medications:    acetaminophen (TYLENOL) 500 MG tablet, Take 500 mg by mouth every 6 (six) hours as needed., Disp: , Rfl:    amLODipine (NORVASC) 10 MG tablet, TAKE ONE TABLET BY MOUTH EVERY MORNING, Disp: 90 tablet, Rfl: 1   Carboxymethylcellulose Sod PF 0.5 % SOLN, Place 1 drop into both eyes as needed (Dry Eyes)., Disp: , Rfl:    Cholecalciferol (VITAMIN D3) 2000 units TABS, Take 1 tablet by mouth daily., Disp: , Rfl:    clopidogrel (PLAVIX) 75 MG tablet, TAKE ONE TABLET BY MOUTH EVERY MORNING, Disp: 90 tablet, Rfl: 1   dextromethorphan-guaiFENesin (MUCINEX DM) 30-600 MG 12hr tablet, Take 1 tablet by mouth 2 (two) times daily as needed for cough. With  a tall glass of water, Disp: 20 tablet, Rfl: 0   furosemide (LASIX) 20 MG tablet, Take 1 tablet (20 mg total) by mouth daily., Disp: 90 tablet, Rfl: 1   gabapentin (NEURONTIN) 300 MG capsule, Take 1 capsule (300 mg total) by mouth 3 (three) times daily. Take one capsule three times daily, Disp: 270 capsule, Rfl: 1   simvastatin (ZOCOR) 10 MG tablet, TAKE ONE TABLET BY MOUTH EVERYDAY AT BEDTIME, Disp: 90 tablet, Rfl: 1   sodium bicarbonate 650 MG tablet, Take 1 tablet (650 mg total) by mouth 2 (two) times daily., Disp: 60 tablet, Rfl: 3   sodium zirconium cyclosilicate (LOKELMA) 10 g PACK packet, USE ONE powder PACKET dissolved in liquid ONCE WEEKLY ON MONDAY AND ONCE WEEKLY ON FRIDAY AS DIRECTED, Disp: 8 packet, Rfl: 3   vitamin B-12 (CYANOCOBALAMIN) 1000 MCG tablet, Take 1,000 mcg by mouth daily., Disp: , Rfl:    vitamin C (ASCORBIC ACID) 500 MG tablet, Take 500 mg by mouth daily., Disp: , Rfl:    docusate sodium (COLACE) 100 MG capsule, Take 1 capsule (100 mg total) by mouth 2 (two) times daily. (Patient not taking: Reported on 10/05/2022), Disp: 100 capsule, Rfl: 0   Objective:     BP 130/60 (BP Location: Right Arm, Patient Position: Sitting)   Pulse 66   Temp 98 F (36.7 C) (Temporal)   Ht '5\' 8"'$  (1.727 m)   Wt 177 lb 12.8 oz (  80.6 kg)   SpO2 90%   BMI 27.03 kg/m  BP Readings from Last 3 Encounters:  10/05/22 130/60  09/13/22 115/66  07/12/22 (!) (P) 172/90   Wt Readings from Last 3 Encounters:  10/05/22 177 lb 12.8 oz (80.6 kg)  09/13/22 184 lb (83.5 kg)  07/09/22 185 lb (83.9 kg)      Physical Exam Constitutional:      General: He is not in acute distress.    Appearance: Normal appearance. He is not ill-appearing, toxic-appearing or diaphoretic.  HENT:     Head: Normocephalic and atraumatic.     Right Ear: External ear normal.     Left Ear: External ear normal.     Mouth/Throat:     Mouth: Mucous membranes are moist.     Pharynx: Oropharynx is clear. No oropharyngeal  exudate or posterior oropharyngeal erythema.   Eyes:     General: No scleral icterus.       Right eye: No discharge.        Left eye: No discharge.     Extraocular Movements: Extraocular movements intact.     Conjunctiva/sclera: Conjunctivae normal.     Pupils: Pupils are equal, round, and reactive to light.  Cardiovascular:     Rate and Rhythm: Normal rate and regular rhythm.  Pulmonary:     Effort: Pulmonary effort is normal. No respiratory distress.     Breath sounds: Examination of the right-lower field reveals rhonchi. Examination of the left-lower field reveals rhonchi. Rhonchi present.  Abdominal:     General: Bowel sounds are normal.     Tenderness: There is no abdominal tenderness. There is no guarding.  Musculoskeletal:     Cervical back: No rigidity or tenderness.  Skin:    General: Skin is warm and dry.  Neurological:     Mental Status: He is alert and oriented to person, place, and time.  Psychiatric:        Mood and Affect: Mood normal.        Behavior: Behavior normal.      No results found for any visits on 10/05/22.    The ASCVD Risk score (Arnett DK, et al., 2019) failed to calculate for the following reasons:   The 2019 ASCVD risk score is only valid for ages 24 to 42    Assessment & Plan:   Fibrotic lung diseases ([place]) -     Ambulatory referral to Pulmonology  Hyperkalemia -     Potassium  Snores -     Ambulatory referral to Pulmonology    Return in about 3 months (around 01/05/2023).  Discontinue losartan for now.  Rechecking potassium.  Continue Lokelma.  Also follow-up with nephrology as planned.  Daughter is concerned about apnea and would like her father evaluated.  Libby Maw, MD

## 2022-10-06 LAB — POTASSIUM: Potassium: 4.7 mmol/L (ref 3.5–5.2)

## 2022-10-08 ENCOUNTER — Other Ambulatory Visit: Payer: Medicare Other

## 2022-10-08 NOTE — Progress Notes (Signed)
   10/08/2022  Patient ID: [name], male   DOB: 1928/06/11, 89 y.o.   MRN: [id number]  Subjective/Objective: Telephone visit with patient to transfer care to [facility].  Complete medication review completed, as well as assessment of any barriers to care (access, affordability, adherence). -Patient endorses excellent adherence to medication regimen and no access issues (medications are bubble packed by daily meds and delivered to home by pharmacy). -Does state that Lokelma is $100/fill, which is challenging to afford; he is interested in an alternative medication -Recent hyperkalemia seems to have resolved with removal of daily losartan -States most recent home BP 130/70  Assessment/Plan: -Contacting Dr. Candiss Norse with [facility] to see if patient would be eligible for treatment with a generic product (sodium polystyrene sulfonate)  Follow-Up:  Will follow-up with patient via telephone call or MyChart message (he did not have a preference) to inform of any medication change to expect  Darlina Guys, PharmD, DPLA

## 2022-10-10 ENCOUNTER — Other Ambulatory Visit: Payer: Self-pay

## 2022-10-10 ENCOUNTER — Emergency Department (HOSPITAL_COMMUNITY): Payer: Medicare Other

## 2022-10-10 ENCOUNTER — Encounter (HOSPITAL_COMMUNITY): Payer: Self-pay

## 2022-10-10 ENCOUNTER — Inpatient Hospital Stay (HOSPITAL_COMMUNITY)
Admission: EM | Admit: 2022-10-10 | Discharge: 2022-10-16 | DRG: 193 | Disposition: A | Discharge status: Hospice | Payer: Medicare Other | Attending: Internal Medicine | Admitting: Internal Medicine

## 2022-10-10 ENCOUNTER — Ambulatory Visit: Payer: Medicare Other | Admitting: Family Medicine

## 2022-10-10 DIAGNOSIS — Z6826 Body mass index (BMI) 26.0-26.9, adult: Secondary | ICD-10-CM | POA: Diagnosis not present

## 2022-10-10 DIAGNOSIS — J9601 Acute respiratory failure with hypoxia: Secondary | ICD-10-CM | POA: Diagnosis present

## 2022-10-10 DIAGNOSIS — G629 Polyneuropathy, unspecified: Secondary | ICD-10-CM | POA: Diagnosis not present

## 2022-10-10 DIAGNOSIS — G459 Transient cerebral ischemic attack, unspecified: Secondary | ICD-10-CM | POA: Diagnosis present

## 2022-10-10 DIAGNOSIS — Z66 Do not resuscitate: Secondary | ICD-10-CM | POA: Diagnosis not present

## 2022-10-10 DIAGNOSIS — R0602 Shortness of breath: Secondary | ICD-10-CM | POA: Diagnosis not present

## 2022-10-10 DIAGNOSIS — J984 Other disorders of lung: Principal | ICD-10-CM

## 2022-10-10 DIAGNOSIS — Z1152 Encounter for screening for COVID-19: Secondary | ICD-10-CM | POA: Diagnosis not present

## 2022-10-10 DIAGNOSIS — J8410 Pulmonary fibrosis, unspecified: Secondary | ICD-10-CM | POA: Diagnosis not present

## 2022-10-10 DIAGNOSIS — R54 Age-related physical debility: Secondary | ICD-10-CM | POA: Diagnosis not present

## 2022-10-10 DIAGNOSIS — I10 Essential (primary) hypertension: Secondary | ICD-10-CM | POA: Diagnosis not present

## 2022-10-10 DIAGNOSIS — J13 Pneumonia due to Streptococcus pneumoniae: Secondary | ICD-10-CM | POA: Diagnosis not present

## 2022-10-10 DIAGNOSIS — I6523 Occlusion and stenosis of bilateral carotid arteries: Secondary | ICD-10-CM | POA: Diagnosis not present

## 2022-10-10 DIAGNOSIS — Z885 Allergy status to narcotic agent status: Secondary | ICD-10-CM | POA: Diagnosis not present

## 2022-10-10 DIAGNOSIS — Z880 Allergy status to penicillin: Secondary | ICD-10-CM

## 2022-10-10 DIAGNOSIS — Z8711 Personal history of peptic ulcer disease: Secondary | ICD-10-CM

## 2022-10-10 DIAGNOSIS — H9190 Unspecified hearing loss, unspecified ear: Secondary | ICD-10-CM | POA: Diagnosis not present

## 2022-10-10 DIAGNOSIS — Z8673 Personal history of transient ischemic attack (TIA), and cerebral infarction without residual deficits: Secondary | ICD-10-CM | POA: Diagnosis not present

## 2022-10-10 DIAGNOSIS — K279 Peptic ulcer, site unspecified, unspecified as acute or chronic, without hemorrhage or perforation: Secondary | ICD-10-CM | POA: Diagnosis present

## 2022-10-10 DIAGNOSIS — R627 Adult failure to thrive: Secondary | ICD-10-CM | POA: Diagnosis not present

## 2022-10-10 DIAGNOSIS — I129 Hypertensive chronic kidney disease with stage 1 through stage 4 chronic kidney disease, or unspecified chronic kidney disease: Secondary | ICD-10-CM | POA: Diagnosis present

## 2022-10-10 DIAGNOSIS — Z8616 Personal history of COVID-19: Secondary | ICD-10-CM

## 2022-10-10 DIAGNOSIS — Z7902 Long term (current) use of antithrombotics/antiplatelets: Secondary | ICD-10-CM | POA: Diagnosis not present

## 2022-10-10 DIAGNOSIS — Z7189 Other specified counseling: Secondary | ICD-10-CM | POA: Diagnosis not present

## 2022-10-10 DIAGNOSIS — Z743 Need for continuous supervision: Secondary | ICD-10-CM | POA: Diagnosis not present

## 2022-10-10 DIAGNOSIS — R6889 Other general symptoms and signs: Secondary | ICD-10-CM | POA: Diagnosis not present

## 2022-10-10 DIAGNOSIS — Z77090 Contact with and (suspected) exposure to asbestos: Secondary | ICD-10-CM | POA: Diagnosis present

## 2022-10-10 DIAGNOSIS — Z79899 Other long term (current) drug therapy: Secondary | ICD-10-CM | POA: Diagnosis not present

## 2022-10-10 DIAGNOSIS — R7989 Other specified abnormal findings of blood chemistry: Secondary | ICD-10-CM | POA: Diagnosis present

## 2022-10-10 DIAGNOSIS — I2510 Atherosclerotic heart disease of native coronary artery without angina pectoris: Secondary | ICD-10-CM | POA: Diagnosis present

## 2022-10-10 DIAGNOSIS — R0609 Other forms of dyspnea: Secondary | ICD-10-CM | POA: Diagnosis not present

## 2022-10-10 DIAGNOSIS — E7800 Pure hypercholesterolemia, unspecified: Secondary | ICD-10-CM | POA: Diagnosis not present

## 2022-10-10 DIAGNOSIS — K769 Liver disease, unspecified: Secondary | ICD-10-CM | POA: Diagnosis not present

## 2022-10-10 DIAGNOSIS — R0902 Hypoxemia: Secondary | ICD-10-CM | POA: Diagnosis not present

## 2022-10-10 DIAGNOSIS — R918 Other nonspecific abnormal finding of lung field: Secondary | ICD-10-CM | POA: Diagnosis not present

## 2022-10-10 DIAGNOSIS — J189 Pneumonia, unspecified organism: Secondary | ICD-10-CM

## 2022-10-10 DIAGNOSIS — N184 Chronic kidney disease, stage 4 (severe): Secondary | ICD-10-CM | POA: Diagnosis present

## 2022-10-10 DIAGNOSIS — J849 Interstitial pulmonary disease, unspecified: Secondary | ICD-10-CM

## 2022-10-10 DIAGNOSIS — Z888 Allergy status to other drugs, medicaments and biological substances status: Secondary | ICD-10-CM | POA: Diagnosis not present

## 2022-10-10 DIAGNOSIS — I779 Disorder of arteries and arterioles, unspecified: Secondary | ICD-10-CM | POA: Diagnosis present

## 2022-10-10 DIAGNOSIS — J9621 Acute and chronic respiratory failure with hypoxia: Secondary | ICD-10-CM | POA: Diagnosis not present

## 2022-10-10 DIAGNOSIS — J45909 Unspecified asthma, uncomplicated: Secondary | ICD-10-CM | POA: Diagnosis present

## 2022-10-10 DIAGNOSIS — Z515 Encounter for palliative care: Secondary | ICD-10-CM | POA: Diagnosis not present

## 2022-10-10 DIAGNOSIS — R404 Transient alteration of awareness: Secondary | ICD-10-CM | POA: Diagnosis not present

## 2022-10-10 LAB — CBC
HCT: 41.4 % (ref 39.0–52.0)
Hemoglobin: 13 g/dL (ref 13.0–17.0)
MCH: 26.9 pg (ref 26.0–34.0)
MCHC: 31.4 g/dL (ref 30.0–36.0)
MCV: 85.7 fL (ref 80.0–100.0)
Platelets: 244 10*3/uL (ref 150–400)
RBC: 4.83 MIL/uL (ref 4.22–5.81)
RDW: 13.8 % (ref 11.5–15.5)
WBC: 12.7 10*3/uL — ABNORMAL HIGH (ref 4.0–10.5)
nRBC: 0 % (ref 0.0–0.2)

## 2022-10-10 LAB — RESP PANEL BY RT-PCR (RSV, FLU A&B, COVID)  RVPGX2
Influenza A by PCR: NEGATIVE
Influenza B by PCR: NEGATIVE
Resp Syncytial Virus by PCR: NEGATIVE
SARS Coronavirus 2 by RT PCR: NEGATIVE

## 2022-10-10 LAB — COMPREHENSIVE METABOLIC PANEL
ALT: 8 U/L (ref 0–44)
AST: 17 U/L (ref 15–41)
Albumin: 3.4 g/dL — ABNORMAL LOW (ref 3.5–5.0)
Alkaline Phosphatase: 134 U/L — ABNORMAL HIGH (ref 38–126)
Anion gap: 12 (ref 5–15)
BUN: 33 mg/dL — ABNORMAL HIGH (ref 8–23)
CO2: 23 mmol/L (ref 22–32)
Calcium: 8.8 mg/dL — ABNORMAL LOW (ref 8.9–10.3)
Chloride: 102 mmol/L (ref 98–111)
Creatinine, Ser: 2.36 mg/dL — ABNORMAL HIGH (ref 0.61–1.24)
GFR, Estimated: 25 mL/min — ABNORMAL LOW (ref >60)
Glucose, Bld: 117 mg/dL — ABNORMAL HIGH (ref 70–99)
Potassium: 4.1 mmol/L (ref 3.5–5.1)
Sodium: 137 mmol/L (ref 135–145)
Total Bilirubin: 0.6 mg/dL (ref 0.3–1.2)
Total Protein: 7.3 g/dL (ref 6.5–8.1)

## 2022-10-10 LAB — TROPONIN I (HIGH SENSITIVITY)
Troponin I (High Sensitivity): 93 ng/L — ABNORMAL HIGH (ref <18)
Troponin I (High Sensitivity): 90 ng/L — ABNORMAL HIGH (ref <18)

## 2022-10-10 LAB — BRAIN NATRIURETIC PEPTIDE: B Natriuretic Peptide: 413.2 pg/mL — ABNORMAL HIGH (ref 0.0–100.0)

## 2022-10-10 MED ORDER — AZITHROMYCIN 250 MG PO TABS
500.0000 mg | ORAL_TABLET | Freq: Once | ORAL | Status: AC
  Administered 2022-10-10: 500 mg via ORAL
  Filled 2022-10-10: qty 2

## 2022-10-10 MED ORDER — ACETAMINOPHEN 650 MG RE SUPP: 650.0000 mg | Freq: Four times a day (QID) | RECTAL | Status: DC | PRN

## 2022-10-10 MED ORDER — SODIUM CHLORIDE 0.9 % IV SOLN
1.0000 g | INTRAVENOUS | Status: AC
  Administered 2022-10-11 – 2022-10-14 (×4): 1 g via INTRAVENOUS
  Filled 2022-10-10 (×5): qty 10

## 2022-10-10 MED ORDER — AMLODIPINE BESYLATE 10 MG PO TABS
10.0000 mg | ORAL_TABLET | Freq: Every day | ORAL | Status: DC
  Administered 2022-10-11 – 2022-10-16 (×6): 10 mg via ORAL
  Filled 2022-10-10 (×7): qty 1

## 2022-10-10 MED ORDER — SODIUM BICARBONATE 650 MG PO TABS
650.0000 mg | ORAL_TABLET | Freq: Two times a day (BID) | ORAL | Status: DC
  Administered 2022-10-10 – 2022-10-16 (×12): 650 mg via ORAL
  Filled 2022-10-10 (×13): qty 1

## 2022-10-10 MED ORDER — PREDNISONE 50 MG PO TABS
50.0000 mg | ORAL_TABLET | Freq: Every day | ORAL | Status: DC
  Administered 2022-10-11 – 2022-10-14 (×4): 50 mg via ORAL
  Filled 2022-10-10 (×5): qty 1

## 2022-10-10 MED ORDER — CLOPIDOGREL BISULFATE 75 MG PO TABS
75.0000 mg | ORAL_TABLET | Freq: Every morning | ORAL | Status: DC
  Administered 2022-10-11 – 2022-10-16 (×6): 75 mg via ORAL
  Filled 2022-10-10 (×7): qty 1

## 2022-10-10 MED ORDER — SIMVASTATIN 20 MG PO TABS
10.0000 mg | ORAL_TABLET | Freq: Every day | ORAL | Status: DC
  Administered 2022-10-10 – 2022-10-15 (×6): 10 mg via ORAL
  Filled 2022-10-10 (×6): qty 1

## 2022-10-10 MED ORDER — SODIUM CHLORIDE 0.9 % IV SOLN
1.0000 g | Freq: Once | INTRAVENOUS | Status: AC
  Administered 2022-10-10: 1 g via INTRAVENOUS
  Filled 2022-10-10: qty 10

## 2022-10-10 MED ORDER — SODIUM CHLORIDE 0.9% FLUSH
3.0000 mL | Freq: Two times a day (BID) | INTRAVENOUS | Status: DC
  Administered 2022-10-10 – 2022-10-16 (×13): 3 mL via INTRAVENOUS

## 2022-10-10 MED ORDER — METHYLPREDNISOLONE SODIUM SUCC 125 MG IJ SOLR
125.0000 mg | Freq: Once | INTRAMUSCULAR | Status: AC
  Administered 2022-10-10: 125 mg via INTRAVENOUS
  Filled 2022-10-10: qty 2

## 2022-10-10 MED ORDER — ENOXAPARIN SODIUM 30 MG/0.3ML IJ SOSY
30.0000 mg | PREFILLED_SYRINGE | Freq: Every day | INTRAMUSCULAR | Status: DC
  Administered 2022-10-10 – 2022-10-16 (×7): 30 mg via SUBCUTANEOUS
  Filled 2022-10-10 (×8): qty 0.3

## 2022-10-10 MED ORDER — AZITHROMYCIN 250 MG PO TABS
500.0000 mg | ORAL_TABLET | Freq: Every day | ORAL | Status: AC
  Administered 2022-10-11 – 2022-10-14 (×4): 500 mg via ORAL
  Filled 2022-10-10 (×5): qty 2

## 2022-10-10 MED ORDER — ACETAMINOPHEN 325 MG PO TABS
650.0000 mg | ORAL_TABLET | Freq: Four times a day (QID) | ORAL | Status: DC | PRN
  Administered 2022-10-16: 650 mg via ORAL
  Filled 2022-10-10: qty 2

## 2022-10-10 MED ORDER — GABAPENTIN 300 MG PO CAPS
300.0000 mg | ORAL_CAPSULE | Freq: Three times a day (TID) | ORAL | Status: DC
  Administered 2022-10-10 – 2022-10-13 (×9): 300 mg via ORAL
  Filled 2022-10-10 (×10): qty 1

## 2022-10-10 MED ORDER — POLYETHYLENE GLYCOL 3350 17 G PO PACK
17.0000 g | PACK | Freq: Every day | ORAL | Status: DC | PRN
  Filled 2022-10-10: qty 1

## 2022-10-10 MED ORDER — CARBOXYMETHYLCELLULOSE SOD PF 0.5 % OP SOLN: 1.0000 [drp] | OPHTHALMIC | Status: DC | PRN

## 2022-10-10 MED ORDER — FUROSEMIDE 20 MG PO TABS: 20.0000 mg | ORAL_TABLET | Freq: Every day | ORAL | Status: DC

## 2022-10-10 NOTE — ED Notes (Signed)
ED TO INPATIENT HANDOFF REPORT  ED Nurse Name and Phone #: Richardson Landry [phone]  S Name/Age/Gender [name] 89 y.o. male Room/Bed: 035C/035C  Code Status   Code Status: DNR  Home/SNF/Other Home Patient oriented to: self, place, time, and situation Is this baseline? Yes   Triage Complete: Triage complete  Chief Complaint Acute respiratory failure with hypoxia ([place]) [J96.01]  Triage Note Increased shortness of breath over the past 3 weeks. Had covid in December. Since then has been weaker, more difficulty walking around the house. SOB worse with mild activity. Patient denies chest pain.   Allergies Allergies  Allergen Reactions   Catapres [Clonidine] Other (See Comments)    Dry mouth   Codeine Other (See Comments)    Unknown reaction   Zestril [Lisinopril] Other (See Comments)    Hyperkalemia    Penicillins Rash    Level of Care/Admitting Diagnosis ED Disposition     ED Disposition  Admit   Condition  --   [place]: [place] [[id number]]  Level of Care: Telemetry Medical [104]  May place patient in observation at [facility] or [place] if equivalent level of care is available:: No  Covid Evaluation: Symptomatic Person Under Investigation (PUI) or recent exposure (last 10 days) *Testing Required*  Diagnosis: Acute respiratory failure with hypoxia [facility]) [id number]  Admitting Physician: Marcelyn Bruins [id number]  Attending Physician: Marcelyn Bruins [id number]          B Medical/Surgery History Past Medical History:  Diagnosis Date   Branch retinal vein occlusion of left eye    High cholesterol    Hypertension    Near syncope 12/24/2019   Other fatigue 03/13/2022   PND (post-nasal drip) 12/10/2017   TIA (transient ischemic attack)    Past Surgical History:  Procedure Laterality Date   CAROTID ENDARTERECTOMY Right    HERNIA REPAIR     INNER EAR SURGERY Right    for hearing loss     A IV  Location/Drains/Wounds Patient Lines/Drains/Airways Status     Active Line/Drains/Airways     Name Placement date Placement time Site Days   Peripheral IV 10/10/22 18 G Anterior;Left Forearm 10/10/22  --  Forearm  less than 1            Intake/Output Last 24 hours No intake or output data in the 24 hours ending 10/10/22 1718  Labs/Imaging Results for orders placed or performed during the hospital encounter of 10/10/22 (from the past 48 hour(s))  Resp panel by RT-PCR (RSV, Flu A&B, Covid) Anterior Nasal Swab     Status: None   Collection Time: 10/10/22  9:55 AM   Specimen: Anterior Nasal Swab  Result Value Ref Range   SARS Coronavirus 2 by RT PCR NEGATIVE NEGATIVE   Influenza A by PCR NEGATIVE NEGATIVE   Influenza B by PCR NEGATIVE NEGATIVE    Comment: (NOTE) The Xpert Xpress SARS-CoV-2/FLU/RSV plus assay is intended as an aid in the diagnosis of influenza from Nasopharyngeal swab specimens and should not be used as a sole basis for treatment. Nasal washings and aspirates are unacceptable for Xpert Xpress SARS-CoV-2/FLU/RSV testing.  Fact Sheet for Patients: EntrepreneurPulse.com.au  Fact Sheet for Healthcare Providers: IncredibleEmployment.be  This test is not yet approved or cleared by the [place] FDA and has been authorized for detection and/or diagnosis of SARS-CoV-2 by FDA under an Emergency Use Authorization (EUA). This EUA will remain in effect (meaning this test can be used) for the duration  of the COVID-19 declaration under Section 564(b)(1) of the Act, 21 U.S.C. section 360bbb-3(b)(1), unless the authorization is terminated or revoked.     Resp Syncytial Virus by PCR NEGATIVE NEGATIVE    Comment: (NOTE) Fact Sheet for Patients: EntrepreneurPulse.com.au  Fact Sheet for Healthcare Providers: IncredibleEmployment.be  This test is not yet approved or cleared by the [place]  FDA and has been authorized for detection and/or diagnosis of SARS-CoV-2 by FDA under an Emergency Use Authorization (EUA). This EUA will remain in effect (meaning this test can be used) for the duration of the COVID-19 declaration under Section 564(b)(1) of the Act, 21 U.S.C. section 360bbb-3(b)(1), unless the authorization is terminated or revoked.  Performed at [facility], [place] [address] [place], [place] [address]   Comprehensive metabolic panel     Status: Abnormal   Collection Time: 10/10/22 10:03 AM  Result Value Ref Range   Sodium 137 135 - 145 mmol/L   Potassium 4.1 3.5 - 5.1 mmol/L   Chloride 102 98 - 111 mmol/L   CO2 23 22 - 32 mmol/L   Glucose, Bld 117 (H) 70 - 99 mg/dL    Comment: Glucose reference range applies only to samples taken after fasting for at least 8 hours.   BUN 33 (H) 8 - 23 mg/dL   Creatinine, Ser 2.36 (H) 0.61 - 1.24 mg/dL   Calcium 8.8 (L) 8.9 - 10.3 mg/dL   Total Protein 7.3 6.5 - 8.1 g/dL   Albumin 3.4 (L) 3.5 - 5.0 g/dL   AST 17 15 - 41 U/L   ALT 8 0 - 44 U/L   Alkaline Phosphatase 134 (H) 38 - 126 U/L   Total Bilirubin 0.6 0.3 - 1.2 mg/dL   GFR, Estimated 25 (L) >60 mL/min    Comment: (NOTE) Calculated using the CKD-EPI Creatinine Equation (2021)    Anion gap 12 5 - 15    Comment: Performed at [facility], [place] [address] [place], [place] [address]  CBC     Status: Abnormal   Collection Time: 10/10/22 10:03 AM  Result Value Ref Range   WBC 12.7 (H) 4.0 - 10.5 K/uL   RBC 4.83 4.22 - 5.81 MIL/uL   Hemoglobin 13.0 13.0 - 17.0 g/dL   HCT 41.4 39.0 - 52.0 %   MCV 85.7 80.0 - 100.0 fL   MCH 26.9 26.0 - 34.0 pg   MCHC 31.4 30.0 - 36.0 g/dL   RDW 13.8 11.5 - 15.5 %   Platelets 244 150 - 400 K/uL   nRBC 0.0 0.0 - 0.2 %    Comment: Performed at [facility], [place] [address] [place], [place] [address]  Brain natriuretic peptide     Status: Abnormal   Collection Time: 10/10/22 10:03 AM  Result Value Ref Range   B  Natriuretic Peptide 413.2 (H) 0.0 - 100.0 pg/mL    Comment: Performed at [place] [address], [place], [place] [address]  Troponin I (High Sensitivity)     Status: Abnormal   Collection Time: 10/10/22 10:03 AM  Result Value Ref Range   Troponin I (High Sensitivity) 93 (H) <18 ng/L    Comment: (NOTE) Elevated high sensitivity troponin I (hsTnI) values and significant  changes across serial measurements may suggest ACS but many other  chronic and acute conditions are known to elevate hsTnI results.  Refer to the "Links" section for chest pain algorithms and additional  guidance. Performed at [facility], [address], [place], [place] [address]   Troponin I (High Sensitivity)     Status: Abnormal   Collection Time: 10/10/22  1:32 PM  Result Value Ref Range   Troponin I (High Sensitivity) 90 (H) <18 ng/L    Comment: (NOTE) Elevated high sensitivity troponin I (hsTnI) values and significant  changes across serial measurements may suggest ACS but many other  chronic and acute conditions are known to elevate hsTnI results.  Refer to the "Links" section for chest pain algorithms and additional  guidance. Performed at [facility], [place] [address], [place], [place] [address]    DG Chest Port 1 View  Result Date: 10/10/2022 CLINICAL DATA:  Increasing shortness of breath over the past 3 weeks. Had [place] in December. EXAM: PORTABLE CHEST 1 VIEW COMPARISON:  CT chest dated Dec 23, 2019. Chest x-ray dated May 15, 2019. FINDINGS: The heart remains at the upper limits of normal in size. Normal pulmonary vascularity. Peripheral and basilar predominant interstitial thickening has worsened since 2021. Hazy density overlying the right lung apex. No pleural effusion or pneumothorax. No acute osseous abnormality. IMPRESSION: 1. Progressive chronic interstitial lung disease. 2. Hazy density overlying the right lung apex, nonspecific, but concerning for pneumonia.  Electronically Signed   By: Titus Dubin M.D.   On: 10/10/2022 10:34    Pending Labs Unresulted Labs (From admission, onward)     Start     Ordered   10/17/22 0500  Creatinine, serum  (enoxaparin (LOVENOX)    CrCl < 30 ml/min)  Once,   R       Comments: while on enoxaparin therapy.    10/10/22 1236   10/11/22 0500  Comprehensive metabolic panel  Tomorrow morning,   R        10/10/22 1236   10/11/22 0500  CBC  Tomorrow morning,   R        10/10/22 1236   10/11/22 0500  Magnesium  Tomorrow morning,   R        10/10/22 1236            Vitals/Pain Today's Vitals   10/10/22 1400 10/10/22 1430 10/10/22 1500 10/10/22 1600  BP: (!) 155/72  (!) 151/88 138/72  Pulse: 72  72 76  Resp: (!) 26  17 (!) 33  Temp:  97.9 F (36.6 C)    TempSrc:  Oral    SpO2: 97%  95% 96%  PainSc:        Isolation Precautions No active isolations  Medications Medications  azithromycin (ZITHROMAX) tablet 500 mg (has no administration in time range)  cefTRIAXone (ROCEPHIN) 1 g in sodium chloride 0.9 % 100 mL IVPB (has no administration in time range)  amLODipine (NORVASC) tablet 10 mg (has no administration in time range)  furosemide (LASIX) tablet 20 mg (has no administration in time range)  simvastatin (ZOCOR) tablet 10 mg (has no administration in time range)  sodium bicarbonate tablet 650 mg (has no administration in time range)  gabapentin (NEURONTIN) capsule 300 mg (300 mg Oral Given 10/10/22 1547)  enoxaparin (LOVENOX) injection 30 mg (30 mg Subcutaneous Given 10/10/22 1307)  sodium chloride flush (NS) 0.9 % injection 3 mL (3 mLs Intravenous Given 10/10/22 1307)  acetaminophen (TYLENOL) tablet 650 mg (has no administration in time range)    Or  acetaminophen (TYLENOL) suppository 650 mg (has no administration in time range)  polyethylene glycol (MIRALAX / GLYCOLAX) packet 17 g (has no administration in time range)  predniSONE (  DELTASONE) tablet 50 mg (has no administration in time range)   clopidogrel (PLAVIX) tablet 75 mg (has no administration in time range)  cefTRIAXone (ROCEPHIN) 1 g in sodium chloride 0.9 % 100 mL IVPB (0 g Intravenous Stopped 10/10/22 1222)  azithromycin (ZITHROMAX) tablet 500 mg (500 mg Oral Given 10/10/22 1221)  methylPREDNISolone sodium succinate (SOLU-MEDROL) 125 mg/2 mL injection 125 mg (125 mg Intravenous Given 10/10/22 1328)    Mobility walks     Focused Assessments Cardiac Assessment Handoff:    Lab Results  Component Value Date   CKTOTAL 103 01/09/2007   No results found for: "DDIMER" Does the Patient currently have chest pain? No   , Pulmonary Assessment Handoff:  Lung sounds: Bilateral Breath Sounds: Diminished L Breath Sounds: Diminished R Breath Sounds: Diminished O2 Device: Nasal Cannula O2 Flow Rate (L/min): 2 L/min    R Recommendations: See Admitting Provider Note  Report given to:   Additional Notes: [birth year]!

## 2022-10-10 NOTE — H&P (Addendum)
History and Physical   JOMEL PAPWORTH [id number] DOB: Jun 28, 1928 DOA: 10/10/2022  PCP: Libby Maw, MD   Patient coming from: Home  Chief Complaint: Shortness of breath  HPI: [name] is a 89 y.o. male with medical history significant of CKD 4, TIA, retinal vein occlusion, neuropathy, hypertension, hyperlipidemia, reactive airway disease, peptic ulcer disease, fibrotic lung disease, carotid artery disease presenting with shortness of breath.  Patient reports that feeling weaker since having Covid in December. He has had a few weeks of increased shortness of breath with some associated congestion.  His shortness of breath has been progressive over the last couple weeks, especially on even minimal exertion. He felt significantly worse the last day or 2.  EMS was called today to transfer patient to the ED for further evaluation.  He does have a history of chronic prior neurotic lung disease but is not on any oxygen at baseline. States he did receive a short course of steroids about a month ago without much improvement in symptoms at that time.  Noted to be saturating 70% initially but has improved on 2 L nasal cannula.  Does report some history of lower extremity edema but none recently, is on Lasix for CKD, edema, hypertension.  Patient denies fevers, chills, chest pain, abdominal pain, constipation, diarrhea, nausea, vomiting.  ED Course: Vital signs in the ED significant for blood pressure in the 0000000 to [id number] systolic, respiratory in the 20s, saturations in the 70s on room air but improved to 90s on 2 L.  Lab workup included CMP with BUN of 33 and creatinine stable at 2.36, glucose 117, calcium 8.8, albumin 3.4, alk phos 134.  CBC with leukocytosis to 12.7.  BNP elevated at 413.  Troponin mildly elevated at 93. Respiratory panel for flu COVID RSV pending.  Chest x-ray showed progressive changes of chronic interstitial lung disease as well as a hazy density at the right apex  that is nonspecific but is concerning for pneumonia.  Patient ceftriaxone and azithromycin in the ED.  Review of Systems: As per HPI otherwise all other systems reviewed and are negative.  Past Medical History:  Diagnosis Date   Branch retinal vein occlusion of left eye    High cholesterol    Hypertension    Near syncope 12/24/2019   Other fatigue 03/13/2022   PND (post-nasal drip) 12/10/2017   TIA (transient ischemic attack)     Past Surgical History:  Procedure Laterality Date   CAROTID ENDARTERECTOMY Right    HERNIA REPAIR     INNER EAR SURGERY Right    for hearing loss    Social History  reports that he has never smoked. He has never used smokeless tobacco. He reports current alcohol use. He reports that he does not use drugs.  Allergies  Allergen Reactions   Clonidine     Other reaction(s): Other (See Comments) Dry mouth and ineffective per patient.   Codeine Other (See Comments)    No reaction noted   Lisinopril     hyperkalemia   Penicillins     REACTION: rash    Family History  Problem Relation Age of Onset   Lung cancer Father   Reviewed on admission  Prior to Admission medications   Medication Sig Start Date End Date Taking? Authorizing Provider  acetaminophen (TYLENOL) 500 MG tablet Take 500 mg by mouth every 6 (six) hours as needed.    [provider]  amLODipine (NORVASC) 10 MG tablet TAKE ONE TABLET BY MOUTH  EVERY MORNING 05/09/22   Libby Maw, MD  Carboxymethylcellulose Sod PF 0.5 % SOLN Place 1 drop into both eyes as needed (Dry Eyes).    [provider]  Cholecalciferol (VITAMIN D3) 2000 units TABS Take 1 tablet by mouth daily.    [provider]  clopidogrel (PLAVIX) 75 MG tablet TAKE ONE TABLET BY MOUTH EVERY MORNING 04/10/22   Libby Maw, MD  dextromethorphan-guaiFENesin [facility] DM) 30-600 MG 12hr tablet Take 1 tablet by mouth 2 (two) times daily as needed for cough. With a tall glass of water  09/13/22   Libby Maw, MD  furosemide (LASIX) 20 MG tablet Take 1 tablet (20 mg total) by mouth daily. 08/15/22   Libby Maw, MD  gabapentin (NEURONTIN) 300 MG capsule Take 1 capsule (300 mg total) by mouth 3 (three) times daily. Take one capsule three times daily 05/14/22   Libby Maw, MD  simvastatin (ZOCOR) 10 MG tablet TAKE ONE TABLET BY MOUTH EVERYDAY AT BEDTIME 05/09/22   Libby Maw, MD  sodium bicarbonate 650 MG tablet Take 1 tablet (650 mg total) by mouth 2 (two) times daily. 09/25/22   Libby Maw, MD  sodium zirconium cyclosilicate (LOKELMA) 10 g PACK packet USE ONE powder PACKET dissolved in liquid ONCE WEEKLY ON MONDAY AND ONCE WEEKLY ON FRIDAY AS DIRECTED 09/25/22   Libby Maw, MD  vitamin B-12 (CYANOCOBALAMIN) 1000 MCG tablet Take 1,000 mcg by mouth daily.    [provider]  vitamin C (ASCORBIC ACID) 500 MG tablet Take 500 mg by mouth daily.    [provider]    Physical Exam: Vitals:   10/10/22 0940 10/10/22 1000 10/10/22 1045  BP: (!) 159/74 (!) 161/72 (!) 143/72  Pulse: 85 85 82  Resp: 20 16 (!) 22  Temp: 98.1 F (36.7 C)    TempSrc: Oral    SpO2: (!) 79% 97% 96%    Physical Exam Constitutional:      General: He is not in acute distress.    Appearance: Normal appearance.  HENT:     Head: Normocephalic and atraumatic.     Mouth/Throat:     Mouth: Mucous membranes are moist.     Pharynx: Oropharynx is clear.  Eyes:     Extraocular Movements: Extraocular movements intact.     Pupils: Pupils are equal, round, and reactive to light.  Cardiovascular:     Rate and Rhythm: Normal rate and regular rhythm.     Pulses: Normal pulses.     Heart sounds: Normal heart sounds.  Pulmonary:     Effort: Pulmonary effort is normal. No respiratory distress.     Comments: fine crackles Abdominal:     General: Bowel sounds are normal. There is no distension.     Palpations: Abdomen is soft.      Tenderness: There is no abdominal tenderness.  Musculoskeletal:        General: No swelling or deformity.     Right lower leg: No edema.     Left lower leg: No edema.  Skin:    General: Skin is warm and dry.  Neurological:     General: No focal deficit present.     Mental Status: Mental status is at baseline.    Labs on Admission: I have personally reviewed following labs and imaging studies  CBC: Recent Labs  Lab 10/10/22 1003  WBC 12.7*  HGB 13.0  HCT 41.4  MCV 85.7  PLT 244  Basic Metabolic Panel: Recent Labs  Lab 10/05/22 1631 10/10/22 1003  NA  --  137  K 4.7 4.1  CL  --  102  CO2  --  23  GLUCOSE  --  117*  BUN  --  33*  CREATININE  --  2.36*  CALCIUM  --  8.8*    GFR: Estimated Creatinine Clearance: 18.9 mL/min (A) (by C-G formula based on SCr of 2.36 mg/dL (H)).  Liver Function Tests: Recent Labs  Lab 10/10/22 1003  AST 17  ALT 8  ALKPHOS 134*  BILITOT 0.6  PROT 7.3  ALBUMIN 3.4*    Urine analysis:    Component Value Date/Time   COLORURINE YELLOW 06/12/2022 1329   APPEARANCEUR CLEAR 06/12/2022 1329   LABSPEC 1.025 06/12/2022 1329   PHURINE 6.0 06/12/2022 1329   GLUCOSEU NEGATIVE 06/12/2022 1329   HGBUR NEGATIVE 06/12/2022 1329   BILIRUBINUR NEGATIVE 06/12/2022 1329   KETONESUR NEGATIVE 06/12/2022 1329   PROTEINUR NEGATIVE 04/21/2021 1502   UROBILINOGEN 0.2 06/12/2022 1329   NITRITE NEGATIVE 06/12/2022 1329   LEUKOCYTESUR NEGATIVE 06/12/2022 1329    Radiological Exams on Admission: DG Chest Port 1 View  Result Date: 10/10/2022 CLINICAL DATA:  Increasing shortness of breath over the past 3 weeks. Had [place] in December. EXAM: PORTABLE CHEST 1 VIEW COMPARISON:  CT chest dated Dec 23, 2019. Chest x-ray dated May 15, 2019. FINDINGS: The heart remains at the upper limits of normal in size. Normal pulmonary vascularity. Peripheral and basilar predominant interstitial thickening has worsened since 2021. Hazy density overlying the right  lung apex. No pleural effusion or pneumothorax. No acute osseous abnormality. IMPRESSION: 1. Progressive chronic interstitial lung disease. 2. Hazy density overlying the right lung apex, nonspecific, but concerning for pneumonia. Electronically Signed   By: Titus Dubin M.D.   On: 10/10/2022 10:34    EKG: Independently reviewed.  Sinus rhythm at 87 bpm.  Nonspecific T wave flattening.  Minimal baseline artifact.  Assessment/Plan Principal Problem:   Acute respiratory failure with hypoxia (HCC) Active Problems:   Essential hypertension   Carotid disease, bilateral (HCC)   Transient cerebral ischemia   Peptic ulcer   Elevated cholesterol   Fibrotic lung diseases (HCC)   Stage 4 chronic kidney disease ([place])   Reactive airway disease   Acute respiratory failure with hypoxia Pneumonia ?ILD exacerbation Elevated BNP and troponin > Presenting with shortness of breath that has been progressive for the last several weeks in the setting of known history of fibrotic lung disease not currently on any home oxygen.  Also his chart shows history of "reactive airway disease "unclear if this is the current diagnosis. > Noted to be hypoxic on room air with saturations improving on 2 L in the ED. > Chest x-ray showed findings of chronic ILD and right apex opacity that could represent pneumonia, given this and leukocytosis patient started on antibiotics. > Mildly elevated BNP to 413 with troponin of 93.  Will trend troponin and check echocardiogram as he does not appear fluid overloaded on exam. - Monitor on telemetry - Continue with ceftriaxone and azithromycin - Trend fever curve and WBC - Start steroids to cover for ILD exacerbation - Continue supplemental oxygen, wean as tolerated - Trend troponin - Echocardiogram   CKD 4 > Creatinine stable at 2.36 in ED.  Euvolemic. - Continue home Lasix, bicarb - Hold off on Lokelma for now - Trend renal function and electrolytes  Hyperlipidemia History  of TIA Carotid artery disease - Continue home  simvastatin, Plavix  Hypertension - Continue home amlodipine and Lasix  Neuropathy - Continue home gabapentin  DVT prophylaxis: Lovenox Code Status:   DNR/DNI, discussed on admission. Family Communication:  Updated at bedside Disposition Plan:   Patient is from:  Home  Anticipated DC to:  Home  Anticipated DC date:  1 to 3 days  Anticipated DC barriers: None  Consults called:  None Admission status:  Observation, telemetry  Severity of Illness: The appropriate patient status for this patient is OBSERVATION. Observation status is judged to be reasonable and necessary in order to provide the required intensity of service to ensure the patient's safety. The patient's presenting symptoms, physical exam findings, and initial radiographic and laboratory data in the context of their medical condition is felt to place them at decreased risk for further clinical deterioration. Furthermore, it is anticipated that the patient will be medically stable for discharge from the hospital within 2 midnights of admission.    Marcelyn Bruins MD [facility]  How to contact the [facility] Attending or Consulting provider [place] or covering provider during after hours [place], for this patient?   Check the care team in [facility] and look for a) attending/consulting [facility] provider listed and b) the [facility] team listed Log into [email or web address] and use [place] universal password to access. If you do not have the password, please contact the hospital operator. Locate the [facility] provider you are looking for under [facility] and page to a number that you can be directly reached. If you still have difficulty reaching the provider, please page the [facility] (Director on Call) for the Hospitalists listed on amion for assistance.  10/10/2022, 12:36 PM

## 2022-10-10 NOTE — ED Triage Notes (Signed)
Increased shortness of breath over the past 3 weeks. Had covid in December. Since then has been weaker, more difficulty walking around the house. SOB worse with mild activity. Patient denies chest pain.

## 2022-10-10 NOTE — ED Provider Notes (Signed)
[place] Provider Note   CSN: [id number] Arrival date & time: 10/10/22  [id number]     History  Chief Complaint  Patient presents with   Shortness of Breath    [name] is a 89 y.o. male.   Shortness of Breath    Patient has a history of hypertension, hypercholesterolemia, TIA who presents to the ED with complaints of shortness of breath.  Patient states he has noticed the symptoms over the last few weeks.  He has been feeling congested in his chest like he needs to bring up mucus.  She denies any fevers.  No chest pain.  He has some history of leg swelling but not currently.  Patient states he uses stockings.  He denies any chronic lung history including COPD or asthma.  He is never been a smoker.  Patient states his doctor told him he had some type of scarring condition in his lungs.  Outpatient records reviewed indicate the patient has pulmonary fibrosis.  Home Medications Prior to Admission medications   Medication Sig Start Date End Date Taking? Authorizing Provider  acetaminophen (TYLENOL) 500 MG tablet Take 500 mg by mouth every 6 (six) hours as needed.    [provider]  amLODipine (NORVASC) 10 MG tablet TAKE ONE TABLET BY MOUTH EVERY MORNING 05/09/22   Libby Maw, MD  Carboxymethylcellulose Sod PF 0.5 % SOLN Place 1 drop into both eyes as needed (Dry Eyes).    [provider]  Cholecalciferol (VITAMIN D3) 2000 units TABS Take 1 tablet by mouth daily.    [provider]  clopidogrel (PLAVIX) 75 MG tablet TAKE ONE TABLET BY MOUTH EVERY MORNING 04/10/22   Libby Maw, MD  dextromethorphan-guaiFENesin [facility] DM) 30-600 MG 12hr tablet Take 1 tablet by mouth 2 (two) times daily as needed for cough. With a tall glass of water 09/13/22   Libby Maw, MD  furosemide (LASIX) 20 MG tablet Take 1 tablet (20 mg total) by mouth daily. 08/15/22   Libby Maw, MD   gabapentin (NEURONTIN) 300 MG capsule Take 1 capsule (300 mg total) by mouth 3 (three) times daily. Take one capsule three times daily 05/14/22   Libby Maw, MD  simvastatin (ZOCOR) 10 MG tablet TAKE ONE TABLET BY MOUTH EVERYDAY AT BEDTIME 05/09/22   Libby Maw, MD  sodium bicarbonate 650 MG tablet Take 1 tablet (650 mg total) by mouth 2 (two) times daily. 09/25/22   Libby Maw, MD  sodium zirconium cyclosilicate (LOKELMA) 10 g PACK packet USE ONE powder PACKET dissolved in liquid ONCE WEEKLY ON MONDAY AND ONCE WEEKLY ON FRIDAY AS DIRECTED 09/25/22   Libby Maw, MD  vitamin B-12 (CYANOCOBALAMIN) 1000 MCG tablet Take 1,000 mcg by mouth daily.    [provider]  vitamin C (ASCORBIC ACID) 500 MG tablet Take 500 mg by mouth daily.    [provider]      Allergies    Clonidine, Codeine, Lisinopril, and Penicillins    Review of Systems   Review of Systems  Respiratory:  Positive for shortness of breath.     Physical Exam Updated Vital Signs BP (!) 143/72   Pulse 82   Temp 98.1 F (36.7 C) (Oral)   Resp (!) 22   SpO2 96%  Physical Exam Vitals and nursing note reviewed.  Constitutional:      Appearance: He is well-developed. He is not diaphoretic.  HENT:  Head: Normocephalic and atraumatic.     Right Ear: External ear normal.     Left Ear: External ear normal.  Eyes:     General: No scleral icterus.       Right eye: No discharge.        Left eye: No discharge.     Conjunctiva/sclera: Conjunctivae normal.  Neck:     Trachea: No tracheal deviation.  Cardiovascular:     Rate and Rhythm: Normal rate and regular rhythm.  Pulmonary:     Effort: Pulmonary effort is normal. No respiratory distress.     Breath sounds: No stridor. Examination of the right-lower field reveals rales. Examination of the left-lower field reveals rales. Rales present. No wheezing.  Abdominal:     General: Bowel sounds are normal. There is no  distension.     Palpations: Abdomen is soft.     Tenderness: There is no abdominal tenderness. There is no guarding or rebound.  Musculoskeletal:        General: No tenderness or deformity.     Cervical back: Neck supple.     Right lower leg: No tenderness. No edema.     Left lower leg: No tenderness. No edema.  Skin:    General: Skin is warm and dry.     Findings: No rash.  Neurological:     General: No focal deficit present.     Mental Status: He is alert.     Cranial Nerves: No cranial nerve deficit, dysarthria or facial asymmetry.     Sensory: No sensory deficit.     Motor: No abnormal muscle tone or seizure activity.     Coordination: Coordination normal.  Psychiatric:        Mood and Affect: Mood normal.     ED Results / Procedures / Treatments   Labs (all labs ordered are listed, but only abnormal results are displayed) Labs Reviewed  COMPREHENSIVE METABOLIC PANEL - Abnormal; Notable for the following components:      Result Value   Glucose, Bld 117 (*)    BUN 33 (*)    Creatinine, Ser 2.36 (*)    Calcium 8.8 (*)    Albumin 3.4 (*)    Alkaline Phosphatase 134 (*)    GFR, Estimated 25 (*)    All other components within normal limits  CBC - Abnormal; Notable for the following components:   WBC 12.7 (*)    All other components within normal limits  BRAIN NATRIURETIC PEPTIDE - Abnormal; Notable for the following components:   B Natriuretic Peptide 413.2 (*)    All other components within normal limits  TROPONIN I (HIGH SENSITIVITY) - Abnormal; Notable for the following components:   Troponin I (High Sensitivity) 93 (*)    All other components within normal limits  RESP PANEL BY RT-PCR (RSV, FLU A&B, COVID)  [id number]    EKG EKG Interpretation  Date/Time:  Wednesday October 10 2022 09:50:26 EDT Ventricular Rate:  87 PR Interval:  240 QRS Duration: 90 QT Interval:  333 QTC Calculation: 401 R Axis:   -3 Text Interpretation: Sinus rhythm Prolonged PR interval  Borderline repolarization abnormality No significant change since last tracing Confirmed by Dorie Rank [phone]) on 10/10/2022 10:02:50 AM  Radiology DG Chest Port 1 View  Result Date: 10/10/2022 CLINICAL DATA:  Increasing shortness of breath over the past 3 weeks. Had [place] in December. EXAM: PORTABLE CHEST 1 VIEW COMPARISON:  CT chest dated Dec 23, 2019. Chest x-ray dated May 15, 2019. FINDINGS:  The heart remains at the upper limits of normal in size. Normal pulmonary vascularity. Peripheral and basilar predominant interstitial thickening has worsened since 2021. Hazy density overlying the right lung apex. No pleural effusion or pneumothorax. No acute osseous abnormality. IMPRESSION: 1. Progressive chronic interstitial lung disease. 2. Hazy density overlying the right lung apex, nonspecific, but concerning for pneumonia. Electronically Signed   By: Titus Dubin M.D.   On: 10/10/2022 10:34    Procedures Procedures    Medications Ordered in ED Medications  azithromycin (ZITHROMAX) tablet 500 mg (has no administration in time range)  cefTRIAXone (ROCEPHIN) 1 g in sodium chloride 0.9 % 100 mL IVPB (has no administration in time range)  amLODipine (NORVASC) tablet 10 mg (has no administration in time range)  furosemide (LASIX) tablet 20 mg (has no administration in time range)  simvastatin (ZOCOR) tablet 10 mg (has no administration in time range)  sodium bicarbonate tablet 650 mg (has no administration in time range)  gabapentin (NEURONTIN) capsule 300 mg (has no administration in time range)  Carboxymethylcellulose Sod PF 0.5 % SOLN 1 drop (has no administration in time range)  enoxaparin (LOVENOX) injection 30 mg (has no administration in time range)  sodium chloride flush (NS) 0.9 % injection 3 mL (has no administration in time range)  acetaminophen (TYLENOL) tablet 650 mg (has no administration in time range)    Or  acetaminophen (TYLENOL) suppository 650 mg (has no administration in  time range)  polyethylene glycol (MIRALAX / GLYCOLAX) packet 17 g (has no administration in time range)  cefTRIAXone (ROCEPHIN) 1 g in sodium chloride 0.9 % 100 mL IVPB (0 g Intravenous Stopped 10/10/22 1222)  azithromycin (ZITHROMAX) tablet 500 mg (500 mg Oral Given 10/10/22 1221)    ED Course/ Medical Decision Making/ A&P Clinical Course as of 10/10/22 1240  Wed Oct 10, 2022  1048 X-ray shows progressive chronic lung disease.  Also concerning for new infiltrate [JK]  1211 BNP and troponin elevated.  No prior for comparison [JK]  1211 Comprehensive metabolic panel(!) Metabolic panel does show elevated creatinine.  Similar to previous [JK]  1240 Case discussed with Dr. Trilby Drummer regarding admission [JK]    Clinical Course User Index [JK] Dorie Rank, MD                             Medical Decision Making Differential diagnosis includes but not limited to pneumonia, CHF, pulmonary embolism, pneumothorax, worsening pulmonary fibrosis  Problems Addressed: Chronic lung disease: chronic illness or injury with exacerbation, progression, or side effects of treatment Pneumonia of right upper lobe due to infectious organism: acute illness or injury that poses a threat to life or bodily functions  Amount and/or Complexity of Data Reviewed Labs: ordered. Decision-making details documented in ED Course. Radiology: ordered.  Risk Prescription drug management. Decision regarding hospitalization.   Patient presented with shortness of breath.  Does have history of chronic lung disease but not normally on oxygen.  Patient does have a new oxygen requirement.  Patient's laboratory test do's show slightly elevated BNP as well as troponin.  Not having any chest pain.  Component CHF is a consideration although overall does not appear to be fluid overloaded.  Chest x-ray does suggest the possibility of pneumonia.  He does have an elevated white blood cell count.  Will start the patient on antibiotics.  With  his new oxygen requirement he will need admission to the hospital.  Would plan on echocardiogram for  further evaluation and continue to monitor his troponins but will hold off on diuresis at this time        Final Clinical Impression(s) / ED Diagnoses Final diagnoses:  Chronic lung disease  Pneumonia of right upper lobe due to infectious organism    Rx / DC Orders ED Discharge Orders     None         Dorie Rank, MD 10/10/22 1240

## 2022-10-11 ENCOUNTER — Observation Stay (HOSPITAL_COMMUNITY): Payer: Medicare Other

## 2022-10-11 ENCOUNTER — Telehealth: Payer: Self-pay | Admitting: Pulmonary Disease

## 2022-10-11 DIAGNOSIS — J189 Pneumonia, unspecified organism: Secondary | ICD-10-CM

## 2022-10-11 DIAGNOSIS — I2510 Atherosclerotic heart disease of native coronary artery without angina pectoris: Secondary | ICD-10-CM | POA: Diagnosis present

## 2022-10-11 DIAGNOSIS — Z6826 Body mass index (BMI) 26.0-26.9, adult: Secondary | ICD-10-CM | POA: Diagnosis not present

## 2022-10-11 DIAGNOSIS — R0609 Other forms of dyspnea: Secondary | ICD-10-CM | POA: Diagnosis not present

## 2022-10-11 DIAGNOSIS — Z79899 Other long term (current) drug therapy: Secondary | ICD-10-CM | POA: Diagnosis not present

## 2022-10-11 DIAGNOSIS — R54 Age-related physical debility: Secondary | ICD-10-CM | POA: Diagnosis present

## 2022-10-11 DIAGNOSIS — Z515 Encounter for palliative care: Secondary | ICD-10-CM | POA: Diagnosis not present

## 2022-10-11 DIAGNOSIS — Z885 Allergy status to narcotic agent status: Secondary | ICD-10-CM | POA: Diagnosis not present

## 2022-10-11 DIAGNOSIS — J13 Pneumonia due to Streptococcus pneumoniae: Secondary | ICD-10-CM | POA: Diagnosis present

## 2022-10-11 DIAGNOSIS — J8410 Pulmonary fibrosis, unspecified: Secondary | ICD-10-CM | POA: Diagnosis present

## 2022-10-11 DIAGNOSIS — R918 Other nonspecific abnormal finding of lung field: Secondary | ICD-10-CM | POA: Diagnosis present

## 2022-10-11 DIAGNOSIS — I129 Hypertensive chronic kidney disease with stage 1 through stage 4 chronic kidney disease, or unspecified chronic kidney disease: Secondary | ICD-10-CM | POA: Diagnosis present

## 2022-10-11 DIAGNOSIS — J9601 Acute respiratory failure with hypoxia: Secondary | ICD-10-CM | POA: Diagnosis present

## 2022-10-11 DIAGNOSIS — J984 Other disorders of lung: Secondary | ICD-10-CM | POA: Diagnosis present

## 2022-10-11 DIAGNOSIS — J849 Interstitial pulmonary disease, unspecified: Secondary | ICD-10-CM

## 2022-10-11 DIAGNOSIS — G629 Polyneuropathy, unspecified: Secondary | ICD-10-CM | POA: Diagnosis present

## 2022-10-11 DIAGNOSIS — R0602 Shortness of breath: Secondary | ICD-10-CM | POA: Diagnosis not present

## 2022-10-11 DIAGNOSIS — Z7902 Long term (current) use of antithrombotics/antiplatelets: Secondary | ICD-10-CM | POA: Diagnosis not present

## 2022-10-11 DIAGNOSIS — N184 Chronic kidney disease, stage 4 (severe): Secondary | ICD-10-CM | POA: Diagnosis present

## 2022-10-11 DIAGNOSIS — K769 Liver disease, unspecified: Secondary | ICD-10-CM | POA: Diagnosis present

## 2022-10-11 DIAGNOSIS — Z8616 Personal history of COVID-19: Secondary | ICD-10-CM | POA: Diagnosis not present

## 2022-10-11 DIAGNOSIS — Z7189 Other specified counseling: Secondary | ICD-10-CM | POA: Diagnosis not present

## 2022-10-11 DIAGNOSIS — H9190 Unspecified hearing loss, unspecified ear: Secondary | ICD-10-CM | POA: Diagnosis present

## 2022-10-11 DIAGNOSIS — R7989 Other specified abnormal findings of blood chemistry: Secondary | ICD-10-CM | POA: Diagnosis present

## 2022-10-11 DIAGNOSIS — E7800 Pure hypercholesterolemia, unspecified: Secondary | ICD-10-CM | POA: Diagnosis present

## 2022-10-11 DIAGNOSIS — Z8673 Personal history of transient ischemic attack (TIA), and cerebral infarction without residual deficits: Secondary | ICD-10-CM | POA: Diagnosis not present

## 2022-10-11 DIAGNOSIS — Z1152 Encounter for screening for COVID-19: Secondary | ICD-10-CM | POA: Diagnosis not present

## 2022-10-11 DIAGNOSIS — R627 Adult failure to thrive: Secondary | ICD-10-CM | POA: Diagnosis present

## 2022-10-11 DIAGNOSIS — Z66 Do not resuscitate: Secondary | ICD-10-CM | POA: Diagnosis present

## 2022-10-11 DIAGNOSIS — Z888 Allergy status to other drugs, medicaments and biological substances status: Secondary | ICD-10-CM | POA: Diagnosis not present

## 2022-10-11 LAB — RESPIRATORY PANEL BY PCR

## 2022-10-11 LAB — ECHOCARDIOGRAM COMPLETE
Area-P 1/2: 3.87 cm2
Calc EF: 55.6 %
S' Lateral: 2.9 cm
Single Plane A2C EF: 56.5 %
Single Plane A4C EF: 54.2 %
Weight: 2860.69 oz

## 2022-10-11 LAB — COMPREHENSIVE METABOLIC PANEL
ALT: 8 U/L (ref 0–44)
AST: 14 U/L — ABNORMAL LOW (ref 15–41)
Albumin: 2.9 g/dL — ABNORMAL LOW (ref 3.5–5.0)
Alkaline Phosphatase: 109 U/L (ref 38–126)
Anion gap: 8 (ref 5–15)
BUN: 39 mg/dL — ABNORMAL HIGH (ref 8–23)
CO2: 23 mmol/L (ref 22–32)
Calcium: 8.4 mg/dL — ABNORMAL LOW (ref 8.9–10.3)
Chloride: 103 mmol/L (ref 98–111)
Creatinine, Ser: 2.43 mg/dL — ABNORMAL HIGH (ref 0.61–1.24)
GFR, Estimated: 24 mL/min — ABNORMAL LOW (ref >60)
Glucose, Bld: 133 mg/dL — ABNORMAL HIGH (ref 70–99)
Potassium: 5.1 mmol/L (ref 3.5–5.1)
Sodium: 134 mmol/L — ABNORMAL LOW (ref 135–145)
Total Bilirubin: 0.7 mg/dL (ref 0.3–1.2)
Total Protein: 6.7 g/dL (ref 6.5–8.1)

## 2022-10-11 LAB — CBC
HCT: 36.3 % — ABNORMAL LOW (ref 39.0–52.0)
Hemoglobin: 11.5 g/dL — ABNORMAL LOW (ref 13.0–17.0)
MCH: 27.1 pg (ref 26.0–34.0)
MCHC: 31.7 g/dL (ref 30.0–36.0)
MCV: 85.4 fL (ref 80.0–100.0)
Platelets: 214 10*3/uL (ref 150–400)
RBC: 4.25 MIL/uL (ref 4.22–5.81)
RDW: 13.7 % (ref 11.5–15.5)
WBC: 7.5 10*3/uL (ref 4.0–10.5)
nRBC: 0 % (ref 0.0–0.2)

## 2022-10-11 LAB — LACTATE DEHYDROGENASE: LDH: 186 U/L (ref 98–192)

## 2022-10-11 LAB — SEDIMENTATION RATE: Sed Rate: 17 mm/hr — ABNORMAL HIGH (ref 0–16)

## 2022-10-11 LAB — C-REACTIVE PROTEIN: CRP: 3.1 mg/dL — ABNORMAL HIGH (ref <1.0)

## 2022-10-11 LAB — MAGNESIUM: Magnesium: 2.2 mg/dL (ref 1.7–2.4)

## 2022-10-11 MED ORDER — FUROSEMIDE 10 MG/ML IJ SOLN
60.0000 mg | Freq: Every day | INTRAMUSCULAR | Status: AC
  Administered 2022-10-11 – 2022-10-12 (×2): 60 mg via INTRAVENOUS
  Filled 2022-10-11 (×3): qty 6

## 2022-10-11 NOTE — Telephone Encounter (Signed)
Please schedule patient for hospital follow up for pulmonary fibrosis in 2-4 weeks with me.  Thanks, JD

## 2022-10-11 NOTE — Progress Notes (Signed)
PROGRESS NOTE    [name]  [id number] DOB: 1928-06-17 DOA: 10/10/2022 PCP: Libby Maw, MD     Brief Narrative:  H/o HTN, HLD, CKD4, carotid artery disease ,TIA, renal vein occlusion, neuropathy, pulmonary fibrosis presented to the hospital due to progressive weakness, short of breath since having COVID in December, found to be in hypoxic respiratory failure, saturating 70% initially    Subjective:  He is currently on 2 L oxygen, O2 95% at rest, but dropped to low 80s with activity He denies chest pain, no lower extremity edema, no fever, reported chronic minimal productive cough Son at bedside  Assessment & Plan:  Principal Problem:   Acute respiratory failure with hypoxia ([place]) Active Problems:   Essential hypertension   Carotid disease, bilateral (HCC)   Transient cerebral ischemia   Peptic ulcer   Elevated cholesterol   Fibrotic lung diseases ([place])   Stage 4 chronic kidney disease ([place])   Reactive airway disease   CAP (community acquired pneumonia) due to Pneumococcus ([place])   Elevated brain natriuretic peptide (BNP) level   Elevated troponin   Acute hypoxemic respiratory failure ([place])   ILD (interstitial lung disease) ([place])   Community acquired pneumonia of right upper lobe of lung    Assessment and Plan:  Acute hypoxic respiratory failure (not on home O2 at baseline) -O2 70% on room air on presentation -Chesty wheeze progressive interstitial lung disease and possible superimposed pneumonia right lung apex -Will check procalcitonin -Follow-up on echocardiogram results (elevated BNP) -Continue antibiotic, steroid, change oral Lasix '20mg'$  daily to iv lasix '60mg'$  daily x2, daily weight, strict intake and output, monitor blood pressure and creatinine - pulmonary consult     CKD 4 > Creatinine stable at 2.36 in ED.  Euvolemic. - Continue home Lasix, bicarb - Hold off on Lokelma for now - Trend renal function and electrolytes    Hyperlipidemia History of TIA Carotid artery disease - Continue home simvastatin, Plavix   Hypertension - Continue home amlodipine and Lasix   Neuropathy - Continue home gabapentin   FTT: Will get PT eval     I have Reviewed nursing notes, Vitals, pain scores, I/o's, Lab results and  imaging results since pt's last encounter, details please see discussion above  I ordered the following labs:  Unresulted Labs (From admission, onward)     Start     Ordered   10/17/22 0500  Creatinine, serum  (enoxaparin (LOVENOX)    CrCl < 30 ml/min)  Once,   R       Comments: while on enoxaparin therapy.    10/10/22 1236   10/12/22 0500  Procalcitonin  Daily,   R     References:    Procalcitonin Lower Respiratory Tract Infection AND Sepsis Procalcitonin Algorithm   10/11/22 0833   10/12/22 0500  CBC with Differential/Platelet  Tomorrow morning,   R        10/11/22 0833   10/12/22 [id number]  Basic metabolic panel  Tomorrow morning,   R        10/11/22 0833   10/12/22 0500  Phosphorus  Tomorrow morning,   R        10/11/22 0833   10/11/22 1714  Lactate dehydrogenase  Add-on,   AD        10/11/22 1713   10/11/22 1714  C-reactive protein  Add-on,   AD        10/11/22 1713   10/11/22 1714  Sedimentation rate  Add-on,   AD  10/11/22 1713   10/11/22 1619  Respiratory (~20 pathogens) panel by PCR  (Respiratory panel by PCR (~20 pathogens, ~24 hr TAT)  w precautions)  Once,   R        10/11/22 1618             DVT prophylaxis: enoxaparin (LOVENOX) injection 30 mg Start: 10/10/22 1300   Code Status:   Code Status: DNR  Family Communication: son at bedside  Disposition:   Dispo: The patient is from: home              Anticipated d/c is to: home              Anticipated d/c date is: TBD, likely will need home O2 and home health  Antimicrobials:    Anti-infectives (From admission, onward)    Start     Dose/Rate Route Frequency Ordered Stop   10/11/22 1000  azithromycin  (ZITHROMAX) tablet 500 mg        500 mg Oral Daily 10/10/22 1236 10/15/22 0959   10/11/22 1000  cefTRIAXone (ROCEPHIN) 1 g in sodium chloride 0.9 % 100 mL IVPB        1 g 200 mL/hr over 30 Minutes Intravenous Every 24 hours 10/10/22 1236 10/15/22 0959   10/10/22 1145  cefTRIAXone (ROCEPHIN) 1 g in sodium chloride 0.9 % 100 mL IVPB        1 g 200 mL/hr over 30 Minutes Intravenous  Once 10/10/22 1136 10/10/22 1222   10/10/22 1145  azithromycin (ZITHROMAX) tablet 500 mg        500 mg Oral  Once 10/10/22 1136 10/10/22 1221          Objective: Vitals:   10/11/22 0035 10/11/22 0459 10/11/22 0809 10/11/22 1549  BP: 131/69 139/68 (!) 140/68 129/64  Pulse: 73 63 89 73  Resp: '20 19 17 17  '$ Temp: 99 F (37.2 C) 98 F (36.7 C) 97.8 F (36.6 C) 97.9 F (36.6 C)  TempSrc: Oral Oral Oral Oral  SpO2: 95% 94% 94% 95%  Weight:  81.1 kg     No intake or output data in the 24 hours ending 10/11/22 1852 Filed Weights   10/11/22 0459  Weight: 81.1 kg    Examination:  General exam: alert, awake, communicative,calm, NAD, slightly hard of hearing Respiratory system: Mild bibasilar crackles, no wheezing, no rales, no rhonchi. Respiratory effort normal. Cardiovascular system:  RRR.  Gastrointestinal system: Abdomen is nondistended, soft and nontender.  Normal bowel sounds heard. Central nervous system: Alert and oriented. No focal neurological deficits. Extremities:  no edema Skin: No rashes, lesions or ulcers Psychiatry: Judgement and insight appear normal. Mood & affect appropriate.     Data Reviewed: I have personally reviewed  labs and visualized  imaging studies since the last encounter and formulate the plan        Scheduled Meds:  amLODipine  10 mg Oral Daily   azithromycin  500 mg Oral Daily   clopidogrel  75 mg Oral q morning   enoxaparin (LOVENOX) injection  30 mg Subcutaneous Daily   furosemide  60 mg Intravenous Daily   gabapentin  300 mg Oral TID   predniSONE  50 mg  Oral Q breakfast   simvastatin  10 mg Oral QHS   sodium bicarbonate  650 mg Oral BID   sodium chloride flush  3 mL Intravenous Q12H   Continuous Infusions:  cefTRIAXone (ROCEPHIN)  IV 1 g (10/11/22 0910)     LOS: 0  days   Time spent: 53mns  FFlorencia Reasons MD PhD FACP [facility]  Available via Epic secure chat 7am-7pm for nonurgent issues Please page for urgent issues To page the attending provider between 7A-7P or the covering provider during after hours 7P-7A, please log into the web site [email or web address] and access using universal [place] password for that web site. If you do not have the password, please call the hospital operator.    10/11/2022, 6:52 PM

## 2022-10-11 NOTE — Consult Note (Signed)
NAME:  [name], MRN:  [id number], DOB:  07-Jul-1928, LOS: 0 ADMISSION DATE:  10/10/2022, CONSULTATION DATE:  10/11/22 REFERRING MD:  Erlinda Hong - TRH , CHIEF COMPLAINT:  SOB    History of Present Illness:   89 yo M CKD IV, TIA, HTN, HLD, fibrotic lung dz who presented to ED 3/13 w SOB which had progressed over the last 2 days, but has also generally been feeling worse/weaker since COVID 06/2022.  Had seen his PCP 3/8 for SOB and plan for referral to pulm was made (in chart review also looks like pulm referral was planned following abnormal CT 2020 w fibrotic lung dz, but not seeing that pt saw pulm following this or following 2021 CT chest).  CXR obtained in ED, R apical hazy opacity and worse interstitial thickening compared to 2021 chest CT, concerning for progressive fibrotic changes.  Admitted to [facility], started on CAP coverage, steroids, lasix (which he does take at home as well) and supplemental O2  PCCM is consulted 3/14 for further recommendations in this setting     In d/w pt, sounds like SOB started acutely a few days ago along with "just not feeling right, thinking I was gonna pass out. He contrasted this with his usual physical capabilities just a day or so prior when he independently assembled an above-toilet cabinet.Clarified the feelings of "not feeling right" to be dizziness, tachycardia and hypoxia.  Not sure if he has had any incr BLE edema as he wears compression stockings. Generally more sedentary than he has been previously, but no recent travel etc to typically incr dvt risk.  Has had a chronic cough, is not any worse but has had a slight yellow tinge the last couple of days.  COVID, Flu are neg   Pertinent  Medical History  TIA CKD IV HTN HLD Neuropathy  ILD   Significant Hospital Events: Including procedures, antibiotic start and stop dates in addition to other pertinent events   3/13 admitted to [facility], CAP vs ILD flare vs volume -- started abx, steroids, diuretic  3/14  ECHO w pretty normal LV. Mildly reduced RV systolic fxn, trivial pericardial effusion, Mild LA dilation. PCCM consulted.    Interim History / Subjective:  Feels ok so long as he is laying down Still dizzy and sob when he moves around   Objective   Blood pressure 129/64, pulse 73, temperature 97.9 F (36.6 C), temperature source Oral, resp. rate 17, weight 81.1 kg, SpO2 95 %.       No intake or output data in the 24 hours ending 10/11/22 1550 Filed Weights   10/11/22 0459  Weight: 81.1 kg    Examination: General: well appearing elderly M NAD  HENT: NCAT pink mm glasses [place]  Lungs: CTAb anteriorly , shallow, even respirations  Cardiovascular: rr cap refill < 3 sec  Abdomen: soft ndnt  Extremities: chronic arthritic changes. BLE edema   Neuro: AAOx4  GU: defer   Resolved Hospital Problem list     Assessment & Plan:   Acute resp failure w hypoxia  ILD, progressive Suspected CAP  Possible PE -dont think this is ILD flare, but do think he has had dz progression from 2021 CT. Pt says he saw pulm outpt x1 in 2020 / 2021 and fam felt it was "much ado about nothing" so no further f/u. Can't see this visit in care everywhere so not sure of details.  -think this is likely CAP -BNP slightly elevated but ECHO was pretty normal.  Did have mildly reduced RV fxn and mildly enlarged RV  -with sudden dizziness SOB tachycardia hypoxia at home preceding ED presentation do think PE is reasonable ddx as well (tachycardia seems grossly improved, remains dizzy with mvmnt and mildly hypoxic) P -cont CAP coverage -ok to cont steroids for now but low threshold to dc  -send RVP -send PCT, inflam markers  -will check BLE dopplers -- with CKD can't do CTA chest. ECHO w mildly reduced RV fxn and mild RV dilation  -cont diuresis  -do think he would benefit from OP pulm follow up, I see his PCP is placing referral   CKD IV HTN CAD HLD Hx TIA Neuropathy -per primary   Best Practice (right click  and "Reselect all SmartList Selections" daily)   Diet/type: Regular consistency (see orders) DVT prophylaxis: prophylactic heparin  GI prophylaxis: N/A Lines: N/A Foley:  N/A Code Status:  DNR Last date of multidisciplinary goals of care discussion [--]  Labs   CBC: Recent Labs  Lab 10/10/22 1003 10/11/22 0117  WBC 12.7* 7.5  HGB 13.0 11.5*  HCT 41.4 36.3*  MCV 85.7 85.4  PLT 244 [id number]    Basic Metabolic Panel: Recent Labs  Lab 10/05/22 1631 10/10/22 1003 10/11/22 0117  NA  --  137 134*  K 4.7 4.1 5.1  CL  --  102 103  CO2  --  23 23  GLUCOSE  --  117* 133*  BUN  --  33* 39*  CREATININE  --  2.36* 2.43*  CALCIUM  --  8.8* 8.4*  MG  --   --  2.2   GFR: Estimated Creatinine Clearance: 18.4 mL/min (A) (by C-G formula based on SCr of 2.43 mg/dL (H)). Recent Labs  Lab 10/10/22 1003 10/11/22 0117  WBC 12.7* 7.5    Liver Function Tests: Recent Labs  Lab 10/10/22 1003 10/11/22 0117  AST 17 14*  ALT 8 8  ALKPHOS 134* 109  BILITOT 0.6 0.7  PROT 7.3 6.7  ALBUMIN 3.4* 2.9*   No results for input(s): "LIPASE", "AMYLASE" in the last 168 hours. No results for input(s): "AMMONIA" in the last 168 hours.  ABG No results found for: "PHART", "PCO2ART", "PO2ART", "HCO3", "TCO2", "ACIDBASEDEF", "O2SAT"   Coagulation Profile: No results for input(s): "INR", "PROTIME" in the last 168 hours.  Cardiac Enzymes: No results for input(s): "CKTOTAL", "CKMB", "CKMBINDEX", "TROPONINI" in the last 168 hours.  HbA1C: Hgb A1c MFr Bld  Date/Time Value Ref Range Status  01/19/2021 11:18 AM 6.2 4.6 - 6.5 % Final    Comment:    Glycemic Control Guidelines for People with Diabetes:Non Diabetic:  <6%Goal of Therapy: <7%Additional Action Suggested:  >8%     CBG: No results for input(s): "GLUCAP" in the last 168 hours.  Review of Systems:   Review of Systems  Constitutional: Negative.   HENT: Negative.    Eyes: Negative.   Respiratory:  Positive for cough, sputum production  and shortness of breath. Negative for hemoptysis and wheezing.   Cardiovascular:  Positive for palpitations and leg swelling.  Gastrointestinal: Negative.   Genitourinary: Negative.   Musculoskeletal: Negative.   Skin: Negative.   Neurological:  Positive for dizziness.  Endo/Heme/Allergies: Negative.   Psychiatric/Behavioral: Negative.       Past Medical History:  He,  has a past medical history of [place] retinal vein occlusion of left eye, High cholesterol, Hypertension, Near syncope (12/24/2019), Other fatigue (03/13/2022), PND (post-nasal drip) (12/10/2017), and TIA (transient ischemic attack).   Surgical History:  Past Surgical History:  Procedure Laterality Date   CAROTID ENDARTERECTOMY Right    HERNIA REPAIR     INNER EAR SURGERY Right    for hearing loss     Social History:   reports that he has never smoked. He has never used smokeless tobacco. He reports current alcohol use. He reports that he does not use drugs.   Family History:  His family history includes Lung cancer in his father.   Allergies Allergies  Allergen Reactions   Catapres [Clonidine] Other (See Comments)    Dry mouth   Codeine Other (See Comments)    Unknown reaction   Zestril [Lisinopril] Other (See Comments)    Hyperkalemia    Penicillins Rash     Home Medications  Prior to Admission medications   Medication Sig Start Date End Date Taking? Authorizing Provider  acetaminophen (TYLENOL) 500 MG tablet Take 500 mg by mouth 2 (two) times daily as needed for mild pain, moderate pain, fever or headache.   Yes [provider]  amLODipine (NORVASC) 10 MG tablet TAKE ONE TABLET BY MOUTH EVERY MORNING Patient taking differently: Take 10 mg by mouth daily. 05/09/22  Yes Libby Maw, MD  Artificial Tear Ointment (DRY EYES OP) Place 1 drop into both eyes 2 (two) times daily as needed (dry eyes).   Yes [provider]  ASCORBIC ACID PO Take 1 tablet by mouth daily. Vitamin C,  unknown strength.   Yes [provider]  Cholecalciferol (VITAMIN D-3 PO) Take 1 capsule by mouth daily.   Yes [provider]  clopidogrel (PLAVIX) 75 MG tablet TAKE ONE TABLET BY MOUTH EVERY MORNING Patient taking differently: Take 75 mg by mouth daily. 04/10/22  Yes Libby Maw, MD  Cyanocobalamin (VITAMIN B-12 PO) Take 1 tablet by mouth daily.   Yes [provider]  dextromethorphan-guaiFENesin (MUCINEX DM) 30-600 MG 12hr tablet Take 1 tablet by mouth 2 (two) times daily as needed for cough. With a tall glass of water 09/13/22  Yes Libby Maw, MD  furosemide (LASIX) 20 MG tablet Take 1 tablet (20 mg total) by mouth daily. 08/15/22  Yes Libby Maw, MD  gabapentin (NEURONTIN) 300 MG capsule Take 1 capsule (300 mg total) by mouth 3 (three) times daily. Take one capsule three times daily 05/14/22  Yes Libby Maw, MD  simvastatin (ZOCOR) 10 MG tablet TAKE ONE TABLET BY MOUTH EVERYDAY AT BEDTIME Patient taking differently: Take 10 mg by mouth at bedtime. 05/09/22  Yes Libby Maw, MD  sodium bicarbonate 650 MG tablet Take 1 tablet (650 mg total) by mouth 2 (two) times daily. 09/25/22  Yes Libby Maw, MD  sodium zirconium cyclosilicate (LOKELMA) 10 g PACK packet USE ONE powder PACKET dissolved in liquid ONCE WEEKLY ON MONDAY AND ONCE WEEKLY ON FRIDAY AS DIRECTED Patient taking differently: Take 10 g by mouth See admin instructions. 10 g twice a week on Monday, Friday. 09/25/22  Yes Libby Maw, MD      Eliseo Gum MSN, AGACNP-BC [place] for pager  10/11/2022, 5:15 PM

## 2022-10-11 NOTE — Progress Notes (Signed)
Echocardiogram 2D Echocardiogram has been performed.  [name] 10/11/2022, 10:11 AM

## 2022-10-12 ENCOUNTER — Other Ambulatory Visit: Payer: Self-pay | Admitting: Family Medicine

## 2022-10-12 ENCOUNTER — Inpatient Hospital Stay (HOSPITAL_COMMUNITY): Payer: Medicare Other

## 2022-10-12 DIAGNOSIS — R0602 Shortness of breath: Secondary | ICD-10-CM

## 2022-10-12 DIAGNOSIS — G459 Transient cerebral ischemic attack, unspecified: Secondary | ICD-10-CM

## 2022-10-12 DIAGNOSIS — J9601 Acute respiratory failure with hypoxia: Secondary | ICD-10-CM | POA: Diagnosis not present

## 2022-10-12 LAB — CBC WITH DIFFERENTIAL/PLATELET
Abs Immature Granulocytes: 0.07 10*3/uL (ref 0.00–0.07)
Basophils Absolute: 0 10*3/uL (ref 0.0–0.1)
Basophils Relative: 0 %
Eosinophils Absolute: 0 10*3/uL (ref 0.0–0.5)
Eosinophils Relative: 0 %
HCT: 34.6 % — ABNORMAL LOW (ref 39.0–52.0)
Hemoglobin: 10.9 g/dL — ABNORMAL LOW (ref 13.0–17.0)
Immature Granulocytes: 1 %
Lymphocytes Relative: 7 %
Lymphs Abs: 0.9 10*3/uL (ref 0.7–4.0)
MCH: 27 pg (ref 26.0–34.0)
MCHC: 31.5 g/dL (ref 30.0–36.0)
MCV: 85.6 fL (ref 80.0–100.0)
Monocytes Absolute: 0.5 10*3/uL (ref 0.1–1.0)
Monocytes Relative: 4 %
Neutro Abs: 12.5 10*3/uL — ABNORMAL HIGH (ref 1.7–7.7)
Neutrophils Relative %: 88 %
Platelets: 247 10*3/uL (ref 150–400)
RBC: 4.04 MIL/uL — ABNORMAL LOW (ref 4.22–5.81)
RDW: 13.7 % (ref 11.5–15.5)
WBC: 14.1 10*3/uL — ABNORMAL HIGH (ref 4.0–10.5)
nRBC: 0 % (ref 0.0–0.2)

## 2022-10-12 LAB — PROCALCITONIN: Procalcitonin: <0.1 ng/mL

## 2022-10-12 LAB — BASIC METABOLIC PANEL
Anion gap: 8 (ref 5–15)
BUN: 51 mg/dL — ABNORMAL HIGH (ref 8–23)
CO2: 25 mmol/L (ref 22–32)
Calcium: 8.3 mg/dL — ABNORMAL LOW (ref 8.9–10.3)
Chloride: 101 mmol/L (ref 98–111)
Creatinine, Ser: 2.47 mg/dL — ABNORMAL HIGH (ref 0.61–1.24)
GFR, Estimated: 24 mL/min — ABNORMAL LOW (ref >60)
Glucose, Bld: 139 mg/dL — ABNORMAL HIGH (ref 70–99)
Potassium: 4.5 mmol/L (ref 3.5–5.1)
Sodium: 134 mmol/L — ABNORMAL LOW (ref 135–145)

## 2022-10-12 LAB — PHOSPHORUS: Phosphorus: 4.3 mg/dL (ref 2.5–4.6)

## 2022-10-12 MED ORDER — FUROSEMIDE 20 MG PO TABS
20.0000 mg | ORAL_TABLET | Freq: Every day | ORAL | Status: DC
  Administered 2022-10-13 – 2022-10-16 (×4): 20 mg via ORAL
  Filled 2022-10-12 (×4): qty 1

## 2022-10-12 NOTE — Progress Notes (Signed)
NAME:  [name], MRN:  [id number], DOB:  10-03-1927, LOS: 1 ADMISSION DATE:  10/10/2022, CONSULTATION DATE:  10/11/22 REFERRING MD:  Erlinda Hong - TRH , CHIEF COMPLAINT:  SOB    History of Present Illness:   89 yo M CKD IV, TIA, HTN, HLD, fibrotic lung dz who presented to ED 3/13 w SOB which had progressed over the last 2 days, but has also generally been feeling worse/weaker since COVID 06/2022.  Had seen his PCP 3/8 for SOB and plan for referral to pulm was made (in chart review also looks like pulm referral was planned following abnormal CT 2020 w fibrotic lung dz, but not seeing that pt saw pulm following this or following 2021 CT chest).  CXR obtained in ED, R apical hazy opacity and worse interstitial thickening compared to 2021 chest CT, concerning for progressive fibrotic changes.  Admitted to [facility], started on CAP coverage, steroids, lasix (which he does take at home as well) and supplemental O2  PCCM is consulted 3/14 for further recommendations in this setting     In d/w pt, sounds like SOB started acutely a few days ago along with "just not feeling right, thinking I was gonna pass out. He contrasted this with his usual physical capabilities just a day or so prior when he independently assembled an above-toilet cabinet.Clarified the feelings of "not feeling right" to be dizziness, tachycardia and hypoxia.  Not sure if he has had any incr BLE edema as he wears compression stockings. Generally more sedentary than he has been previously, but no recent travel etc to typically incr dvt risk.  Has had a chronic cough, is not any worse but has had a slight yellow tinge the last couple of days.  COVID, Flu are neg   Pertinent  Medical History  TIA CKD IV HTN HLD Neuropathy  ILD   Significant Hospital Events: Including procedures, antibiotic start and stop dates in addition to other pertinent events   3/13 admitted to [facility], CAP vs ILD flare vs volume -- started abx, steroids, diuretic  3/14  ECHO w pretty normal LV. Mildly reduced RV systolic fxn, trivial pericardial effusion, Mild LA dilation. PCCM consulted.    Interim History / Subjective:  No acute distress sitting on side of the bed eating  Objective   Blood pressure 120/61, pulse 66, temperature 97.9 F (36.6 C), temperature source Oral, resp. rate 17, weight 80.5 kg, SpO2 94 %.        Intake/Output Summary (Last 24 hours) at 10/12/2022 0932 Last data filed at 10/12/2022 [id number] Gross per 24 hour  Intake 343 ml  Output 350 ml  Net -7 ml   Filed Weights   10/11/22 0459 10/12/22 0353  Weight: 81.1 kg 80.5 kg    Examination: General: Well-nourished well-developed male sitting on side of the bed eating HEENT: MM pink/moist no JVD or lymphadenopathy is appreciated Neuro: Alert and being hard of hearing but intact CV: Heart sounds are regular PULM: Diminished in the bases O2 sats 94% on room air GI: soft, bsx4 active  GU: Voids Extremities: warm/dry, negative [place] is on 2 L but they got documented he is on room air orthostatic did not have seen him edema  Skin: no rashes or lesions   Resolved Hospital Problem list     Assessment & Plan:   Acute resp failure w hypoxia  ILD, progressive Suspected CAP  Possible PE Continue steroids for approximately 5 more days Complete antimicrobial therapy Procalcitonin was inflammatory marker was  unremarkable Follow-up outpatient pulmonary with Dr. Erin Fulling       CKD IV HTN CAD HLD Hx TIA Neuropathy -per primary   Best Practice (right click and "Reselect all SmartList Selections" daily)   Diet/type: Regular consistency (see orders) DVT prophylaxis: prophylactic heparin  GI prophylaxis: N/A Lines: N/A Foley:  N/A Code Status:  DNR Last date of multidisciplinary goals of care discussion [--]  Labs   CBC: Recent Labs  Lab 10/10/22 1003 10/11/22 0117 10/12/22 0335  WBC 12.7* 7.5 14.1*  NEUTROABS  --   --  12.5*  HGB 13.0 11.5* 10.9*  HCT 41.4 36.3*  34.6*  MCV 85.7 85.4 85.6  PLT 244 214 247     Basic Metabolic Panel: Recent Labs  Lab 10/05/22 1631 10/10/22 1003 10/11/22 0117 10/12/22 0335  NA  --  137 134* 134*  K 4.7 4.1 5.1 4.5  CL  --  102 103 101  CO2  --  23 23 25   GLUCOSE  --  117* 133* 139*  BUN  --  33* 39* 51*  CREATININE  --  2.36* 2.43* 2.47*  CALCIUM  --  8.8* 8.4* 8.3*  MG  --   --  2.2  --   PHOS  --   --   --  4.3    GFR: Estimated Creatinine Clearance: 18.1 mL/min (A) (by C-G formula based on SCr of 2.47 mg/dL (H)). Recent Labs  Lab 10/10/22 1003 10/11/22 0117 10/12/22 0335  PROCALCITON  --   --  <0.10  WBC 12.7* 7.5 14.1*     Liver Function Tests: Recent Labs  Lab 10/10/22 1003 10/11/22 0117  AST 17 14*  ALT 8 8  ALKPHOS 134* 109  BILITOT 0.6 0.7  PROT 7.3 6.7  ALBUMIN 3.4* 2.9*    No results for input(s): "LIPASE", "AMYLASE" in the last 168 hours. No results for input(s): "AMMONIA" in the last 168 hours.  ABG No results found for: "PHART", "PCO2ART", "PO2ART", "HCO3", "TCO2", "ACIDBASEDEF", "O2SAT"   Coagulation Profile: No results for input(s): "INR", "PROTIME" in the last 168 hours.  Cardiac Enzymes: No results for input(s): "CKTOTAL", "CKMB", "CKMBINDEX", "TROPONINI" in the last 168 hours.  HbA1C: Hgb A1c MFr Bld  Date/Time Value Ref Range Status  01/19/2021 11:18 AM 6.2 4.6 - 6.5 % Final    Comment:    Glycemic Control Guidelines for People with Diabetes:Non Diabetic:  <6%Goal of Therapy: <7%Additional Action Suggested:  >8%     CBG: No results for input(s): "GLUCAP" in the last 168 hours.  Richardson Landry Shawntell Dixson ACNP Acute Care Nurse Practitioner [place] Please consult Amion 10/12/2022, 9:33 AM

## 2022-10-12 NOTE — Evaluation (Signed)
Physical Therapy Evaluation Patient Details Name: [name] MRN: [id number] DOB: April 23, 1928 Today's Date: 10/12/2022  History of Present Illness  Pt presents to [facility] ED via EMS with shortness of breathe for the past 3 weeks with minimal exertion. Pt chet x-ray was concerning for pneumonia per MD note with progression of interstitial lung disease. 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 Pt newly diagnosed wth cancer 10/12/22.  Clinical Impression  Pt is presenting slightly below baseline. Currently pt is Mod I for bed mobility and CGA for transfers and taking a few steps from EOB to recliner while holding onto the recliner. Pt has assistance from daughter at home and she can be with him until May when she goes back to work part time. Pt would like to go home with family and with assistance this should be okay without significant concern for falls as long as pt has HHPT to work on balance/strength and initially 24/7 supervision for transfers and gait. Pt O2 sats dropped to 85% with transfers from EOB to recliner on 2 L O2 via [place]; 2 min and slightly reclined to get back up to 93%.     Recommendations for follow up therapy are one component of a multi-disciplinary discharge planning process, led by the attending physician.  Recommendations may be updated based on patient status, additional functional criteria and insurance authorization.  Follow Up Recommendations Home health PT      Assistance Recommended at Discharge Intermittent Supervision/Assistance  Patient can return home with the following  A little help with walking and/or transfers;Assist for transportation;Help with stairs or ramp for entrance;Assistance with cooking/housework    Equipment Recommendations Rolling walker (2 wheels)  Recommendations for Other Services       Functional Status Assessment Patient has had a recent decline in their functional status and  demonstrates the ability to make significant improvements in function in a reasonable and predictable amount of time.     Precautions / Restrictions Precautions Precautions: Fall Restrictions Weight Bearing Restrictions: No      Mobility  Bed Mobility Overal bed mobility: Modified Independent             General bed mobility comments: For supine to sitting pt requires no asisstance with minimal use of bed rails and HOB lowered. Patient Response: Cooperative, Flat affect  Transfers Overall transfer level: Needs assistance Equipment used: None Transfers: Sit to/from Stand, Bed to chair/wheelchair/BSC Sit to Stand: Min guard   Step pivot transfers: Min guard       General transfer comment: Pt is CGA for transfers with standing and quickly grabbing arm rest of the recliner with flexed trunk, no overt LOB.    Ambulation/Gait Ambulation/Gait assistance:  (holding onto the arm rest of the recliner.)     Gait Pattern/deviations: Step-to pattern, Decreased step length - right, Decreased step length - left   Gait velocity interpretation: <1.31 ft/sec, indicative of household ambulator   General Gait Details: Pt just took a few steps from EOB to the recliner while holding on arm rest due to pt had recent doppler done, MD states pt needs to be up in chair and would like PT to get pt up. No gait waiting on results of doppler.      Balance Overall balance assessment: Needs assistance   Sitting balance-Leahy Scale: Normal     Standing balance support: Single extremity supported, Bilateral upper extremity supported Standing balance-Leahy Scale: Fair Standing balance comment: CGA  for gait with flexted trunk       Pertinent Vitals/Pain Pain Assessment Pain Assessment: No/denies pain    Home Living Family/patient expects to be discharged to:: Private residence Living Arrangements: Children (daughter) Available Help at Discharge: Available PRN/intermittently Type of  Home: Other(Comment) (condo) Home Access: Level entry       Home Layout: One level Home Equipment: Grab bars - tub/shower;Shower seat - built in;Grab bars - toilet;Rollator (4 wheels);Rolling Walker (2 wheels);Cane - single point      Prior Function Prior Level of Function : Independent/Modified Independent             Mobility Comments: Ambulated with rollator ADLs Comments: modified independent with ADL"s.     Hand Dominance   Dominant Hand: Right    Extremity/Trunk Assessment   Upper Extremity Assessment Upper Extremity Assessment: Generalized weakness    Lower Extremity Assessment Lower Extremity Assessment: Generalized weakness    Cervical / Trunk Assessment Cervical / Trunk Assessment: Normal  Communication   Communication: HOH;No difficulties  Cognition Arousal/Alertness: Awake/alert Behavior During Therapy: WFL for tasks assessed/performed Overall Cognitive Status: Within Functional Limits for tasks assessed        General Comments General comments (skin integrity, edema, etc.): Pt just received news about his health on entering. Flat effect. Pt was discussed next steps.        Assessment/Plan    PT Assessment Patient needs continued PT services  PT Problem List Decreased mobility;Decreased balance       PT Treatment Interventions DME instruction;Therapeutic activities;Gait training;Therapeutic exercise;Patient/family education;Stair training;Balance training;Functional mobility training;Neuromuscular re-education    PT Goals (Current goals can be found in the Care Plan section)  Acute Rehab PT Goals Patient Stated Goal: To return home. PT Goal Formulation: With patient Time For Goal Achievement: 10/26/22 Potential to Achieve Goals: Fair    Frequency Min 3X/week        AM-PAC PT "6 Clicks" Mobility  Outcome Measure Help needed turning from your back to your side while in a flat bed without using bedrails?: None Help needed moving from  lying on your back to sitting on the side of a flat bed without using bedrails?: None Help needed moving to and from a bed to a chair (including a wheelchair)?: A Little Help needed standing up from a chair using your arms (e.g., wheelchair or bedside chair)?: A Little Help needed to walk in hospital room?: A Little Help needed climbing 3-5 steps with a railing? : A Little 6 Click Score: 20    End of Session   Activity Tolerance: Treatment limited secondary to medical complications (Comment) (limited due to doppler for concerns of DVT. Went ahead and got pt up due to MD stating pt needs to get up into chair and MD is aware of doppler.) Patient left: in chair;with call bell/phone within reach Nurse Communication: Mobility status PT Visit Diagnosis: Unsteadiness on feet (R26.81);Other abnormalities of gait and mobility (R26.89)    Time: [id number] PT Time Calculation (min) (ACUTE ONLY): 23 min   Charges:   PT Evaluation $PT Eval Low Complexity: 1 Low PT Treatments $Therapeutic Activity: 8-22 mins       Tomma Rakers, DPT, CLT  Acute Rehabilitation Services Office: [phone] (Secure chat preferred)   Ander Purpura 10/12/2022, 3:48 PM

## 2022-10-12 NOTE — Progress Notes (Signed)
PROGRESS NOTE    [name]  [id number] DOB: 05-04-1928 DOA: 10/10/2022 PCP: Libby Maw, MD     Brief Narrative:  H/o HTN, HLD, CKD4, carotid artery disease ,TIA, renal vein occlusion, neuropathy, pulmonary fibrosis presented to the hospital due to progressive weakness, short of breath since having COVID in December, found to be in hypoxic respiratory failure, saturating 70% initially    Subjective:  He is currently on 2 L oxygen, O2 100% at rest, still has DOE, Pt O2 sats dropped to 85% with transfers from EOB to recliner on 2 L O2 via [place]; 2 min and slightly reclined to get back up to 93%.  but not in acute distress, his main concern is being weak He denies chest pain, no lower extremity edema, no fever, reported chronic minimal productive cough Son at bedside  Assessment & Plan:  Principal Problem:   Acute respiratory failure with hypoxia (HCC) Active Problems:   Essential hypertension   Carotid disease, bilateral (HCC)   Transient cerebral ischemia   Peptic ulcer   Elevated cholesterol   Fibrotic lung diseases (HCC)   Stage 4 chronic kidney disease (HCC)   Reactive airway disease   CAP (community acquired pneumonia) due to Pneumococcus (HCC)   Elevated brain natriuretic peptide (BNP) level   Elevated troponin   Acute hypoxemic respiratory failure (HCC)   ILD (interstitial lung disease) ([place])   Community acquired pneumonia of right upper lobe of lung    Assessment and Plan:  Acute hypoxic respiratory failure (not on home O2 at baseline) -O2 70% on room air on presentation -Cxr  progressive interstitial lung disease and possible superimposed pneumonia right lung apex -Respiratory viral panel negative ,procalcitonin unremarkable, urine Legionella /urine strep pneumo in process -echocardiogram results (elevated BNP) with preserved LVEF, possible diastolic dysfunction -Continue antibiotic, steroid, received iv lasix 60mg  daily x2, does not appear  grossly volume overloaded , change back to home dose lasix, continue daily weight, strict intake and output, monitor blood pressure and creatinine - pulmonology in put appreciated    Concerning for metastatic cancer on CT chest Pccm to discuss with him and family about biopsy vs palliative care/hospice Palliative care consulted    CKD 4 > Creatinine stable at 2.36 in ED.  Euvolemic. - Continue home Lasix, bicarb -  Trend renal function and electrolytes   Hyperlipidemia History of TIA Carotid artery disease - Continue home simvastatin, Plavix   Hypertension - Continue home amlodipine and Lasix   Neuropathy - Continue home gabapentin   FTT:  PT eval recommended home health    I have Reviewed nursing notes, Vitals, pain scores, I/o's, Lab results and  imaging results since pt's last encounter, details please see discussion above  I ordered the following labs:  Unresulted Labs (From admission, onward)     Start     Ordered   10/17/22 0500  Creatinine, serum  (enoxaparin (LOVENOX)    CrCl < 30 ml/min)  Once,   R       Comments: while on enoxaparin therapy.    10/10/22 1236   10/13/22 0500  AFP tumor marker  Tomorrow morning,   R        10/12/22 1321   10/13/22 0500  CBC with Differential/Platelet  Tomorrow morning,   R        10/12/22 1321   10/13/22 [id number]  Basic metabolic panel  Tomorrow morning,   R        10/12/22 1321   10/12/22 1310  Legionella Pneumophila Serogp 1 Ur Ag  Once,   R        10/12/22 1309   10/12/22 1310  Strep pneumoniae urinary antigen  Once,   R        10/12/22 1309   10/12/22 0500  Procalcitonin  Daily,   R     References:    Procalcitonin Lower Respiratory Tract Infection AND Sepsis Procalcitonin Algorithm   10/11/22 0833             DVT prophylaxis: enoxaparin (LOVENOX) injection 30 mg Start: 10/10/22 1300   Code Status:   Code Status: DNR  Family Communication: son at bedside  Disposition:   Dispo: The patient is from: home               Anticipated d/c is to: home              Anticipated d/c date is: possible on 3/16 or 3/17 pending goals of care discussion,  need home O2 and possible home health vs home hospice   Antimicrobials:    Anti-infectives (From admission, onward)    Start     Dose/Rate Route Frequency Ordered Stop   10/11/22 1000  azithromycin (ZITHROMAX) tablet 500 mg        500 mg Oral Daily 10/10/22 1236 10/15/22 0959   10/11/22 1000  cefTRIAXone (ROCEPHIN) 1 g in sodium chloride 0.9 % 100 mL IVPB        1 g 200 mL/hr over 30 Minutes Intravenous Every 24 hours 10/10/22 1236 10/15/22 0959   10/10/22 1145  cefTRIAXone (ROCEPHIN) 1 g in sodium chloride 0.9 % 100 mL IVPB        1 g 200 mL/hr over 30 Minutes Intravenous  Once 10/10/22 1136 10/10/22 1222   10/10/22 1145  azithromycin (ZITHROMAX) tablet 500 mg        500 mg Oral  Once 10/10/22 1136 10/10/22 1221          Objective: Vitals:   10/11/22 1940 10/12/22 0352 10/12/22 0353 10/12/22 0836  BP: 133/70 130/71  120/61  Pulse: 81 66  66  Resp: 17 16  17   Temp: 97.6 F (36.4 C) 97.7 F (36.5 C)  97.9 F (36.6 C)  TempSrc: Oral Oral  Oral  SpO2: 99% 97%  94%  Weight:   80.5 kg     Intake/Output Summary (Last 24 hours) at 10/12/2022 1327 Last data filed at 10/12/2022 0947 Gross per 24 hour  Intake 583 ml  Output 350 ml  Net 233 ml   Filed Weights   10/11/22 0459 10/12/22 0353  Weight: 81.1 kg 80.5 kg    Examination:  General exam: alert, awake, communicative,calm, NAD, slightly hard of hearing Respiratory system: Mild bibasilar crackles, no wheezing, no rales, no rhonchi. Respiratory effort normal. Cardiovascular system:  RRR.  Gastrointestinal system: Abdomen is nondistended, soft and nontender.  Normal bowel sounds heard. Central nervous system: Alert and oriented. No focal neurological deficits. Extremities:  no edema Skin: No rashes, lesions or ulcers Psychiatry: Judgement and insight appear normal. Mood & affect  appropriate.     Data Reviewed: I have personally reviewed  labs and visualized  imaging studies since the last encounter and formulate the plan        Scheduled Meds:  amLODipine  10 mg Oral Daily   azithromycin  500 mg Oral Daily   clopidogrel  75 mg Oral q morning   enoxaparin (LOVENOX) injection  30 mg Subcutaneous Daily  gabapentin  300 mg Oral TID   predniSONE  50 mg Oral Q breakfast   simvastatin  10 mg Oral QHS   sodium bicarbonate  650 mg Oral BID   sodium chloride flush  3 mL Intravenous Q12H   Continuous Infusions:  cefTRIAXone (ROCEPHIN)  IV 1 g (10/12/22 1102)     LOS: 1 day   Time spent: 67mins  Florencia Reasons, MD PhD FACP [facility]  Available via Epic secure chat 7am-7pm for nonurgent issues Please page for urgent issues To page the attending provider between 7A-7P or the covering provider during after hours 7P-7A, please log into the web site [email or web address] and access using universal [employer] password for that web site. If you do not have the password, please call the hospital operator.    10/12/2022, 1:27 PM

## 2022-10-12 NOTE — Progress Notes (Signed)
Lower extremity venous bilateral study completed.   Please see CV Proc for preliminary results.   Cybil Senegal, RDMS, RVT  

## 2022-10-13 DIAGNOSIS — J9601 Acute respiratory failure with hypoxia: Secondary | ICD-10-CM | POA: Diagnosis not present

## 2022-10-13 LAB — BASIC METABOLIC PANEL
Anion gap: 10 (ref 5–15)
BUN: 61 mg/dL — ABNORMAL HIGH (ref 8–23)
CO2: 25 mmol/L (ref 22–32)
Calcium: 8.2 mg/dL — ABNORMAL LOW (ref 8.9–10.3)
Chloride: 100 mmol/L (ref 98–111)
Creatinine, Ser: 2.52 mg/dL — ABNORMAL HIGH (ref 0.61–1.24)
GFR, Estimated: 23 mL/min — ABNORMAL LOW (ref >60)
Glucose, Bld: 98 mg/dL (ref 70–99)
Potassium: 4.3 mmol/L (ref 3.5–5.1)
Sodium: 135 mmol/L (ref 135–145)

## 2022-10-13 LAB — CBC WITH DIFFERENTIAL/PLATELET
Abs Immature Granulocytes: 0.07 10*3/uL (ref 0.00–0.07)
Basophils Absolute: 0 10*3/uL (ref 0.0–0.1)
Basophils Relative: 0 %
Eosinophils Absolute: 0 10*3/uL (ref 0.0–0.5)
Eosinophils Relative: 0 %
HCT: 35.8 % — ABNORMAL LOW (ref 39.0–52.0)
Hemoglobin: 11 g/dL — ABNORMAL LOW (ref 13.0–17.0)
Immature Granulocytes: 1 %
Lymphocytes Relative: 7 %
Lymphs Abs: 0.8 10*3/uL (ref 0.7–4.0)
MCH: 26.7 pg (ref 26.0–34.0)
MCHC: 30.7 g/dL (ref 30.0–36.0)
MCV: 86.9 fL (ref 80.0–100.0)
Monocytes Absolute: 0.5 10*3/uL (ref 0.1–1.0)
Monocytes Relative: 4 %
Neutro Abs: 10.8 10*3/uL — ABNORMAL HIGH (ref 1.7–7.7)
Neutrophils Relative %: 88 %
Platelets: 274 10*3/uL (ref 150–400)
RBC: 4.12 MIL/uL — ABNORMAL LOW (ref 4.22–5.81)
RDW: 13.8 % (ref 11.5–15.5)
WBC: 12.2 10*3/uL — ABNORMAL HIGH (ref 4.0–10.5)
nRBC: 0 % (ref 0.0–0.2)

## 2022-10-13 LAB — PROCALCITONIN: Procalcitonin: 0.07 ng/mL

## 2022-10-13 LAB — STREP PNEUMONIAE URINARY ANTIGEN: Strep Pneumo Urinary Antigen: NEGATIVE

## 2022-10-13 MED ORDER — GABAPENTIN 300 MG PO CAPS
300.0000 mg | ORAL_CAPSULE | Freq: Two times a day (BID) | ORAL | Status: DC
  Administered 2022-10-13 – 2022-10-16 (×6): 300 mg via ORAL
  Filled 2022-10-13 (×6): qty 1

## 2022-10-13 NOTE — Consult Note (Signed)
Palliative Medicine Inpatient Consult Note  Consulting Provider: Florencia Reasons, MD   Reason for consult:   [place] Palliative Medicine Consult  Reason for Consult? new diagnosis of cancer,  pccm recommend palliative care, patient and family is thinking about hospice, would like to talk to palliative care,thx   10/13/2022  HPI:  Per intake H&P --> [name] is a 89 y.o. male with medical history significant of CKD 4, TIA, retinal vein occlusion, neuropathy, hypertension, hyperlipidemia, reactive airway disease, peptic ulcer disease, fibrotic lung disease, carotid artery disease presenting with shortness of breath. CT chest results are concerning for metastatic cancer. Palliative care consulted for further goals of care conversations regarding the possibility of further medical workup versus hospice care.   Clinical Assessment/Goals of Care:  *Please note that this is a verbal dictation therefore any spelling or grammatical errors are due to the "[place]" system interpretation.  I have reviewed medical records including EPIC notes, labs and imaging, received report from bedside RN, assessed the patient who is sitting up at the side of the bed in no acute distress.    I met with Alric Seton to further discuss diagnosis prognosis, GOC, EOL wishes, disposition and options.   I introduced Palliative Medicine as specialized medical care for people living with serious illness. It focuses on providing relief from the symptoms and stress of a serious illness. The goal is to improve quality of life for both the patient and the family.  Medical History Review and Understanding:  A review of [name]'s past medical history inclusive of his neuropathy, hypertension, peptic ulcer disease, fibrotic lung disease, coronary artery disease with prior TIA, and chronic kidney disease was held.  Social History:  He would shares with me that he has lived in [facility] for roughly 45 years.  He is a widower and spent 69 years with his wife.  He had 5 children though lost 1 to suicide.  He has 4 grandchildren 1 of whom just passed away of cancer.  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  He is a man of faith though he cannot delineate anyone faith that he practices as he is knowledgeable of multiple denomination of belief systems.  Formerly Paramedic enjoyed camping and traveling for pleasure.  He recounts that he and his wife refurbished a log cabin from the early 1800s.  Functional and Nutritional State:  Preceding admission Ed lived in his [place] with his daughter [name].  He was able to do B ADLs independently.  His appetite has been fair.  Advance Directives:  A detailed discussion was had today regarding advanced directives.  Patient shares that he can make decisions for himself though if unable to he would rely upon his children to make decisions in his best interest.  Code Status:  Concepts specific to code status, artifical feeding and hydration, continued IV antibiotics and rehospitalization was had.  The difference between a aggressive medical intervention path  and a palliative comfort care path for this patient at this time was had.   [name] confirms being an established DO NOT RESUSCITATE DO NOT INTUBATE CODE STATUS.  Discussion:  He would and I had a long conversation related to his acute hospitalization in the setting of shortness of breath.  He vocalizes to me that he has been told he likely has stage IV lung cancer.  He  shares that one of his 4 children want him to pursue workup with an oncologist though he does not see the utility in this.  He expresses that he is 89 years old and has lived a wonderful life.  He further goes on to say that he would not want life extended measures such as chemotherapy or radiation.  We  discussed the idea of hospice care which could be done in his home setting. I described hospice as a service for patients who have a life expectancy of 6 months or less. The goal of hospice is the preservation of dignity and quality at the end phases of life. Under hospice care, the focus changes from curative to symptom relief.  Whit feels that this would be most aligned with what his wishes would be.  We discussed having a dedicated family meeting tomorrow at 2 PM for further conversation.  [name] shares with me the love he has for his family and his grandchildren.  He was able to share a little bit about each grandchild with me and how proud he is of their accomplishments.  He shares that he is not fearful to die though is saddened to leave them behind.  Discussed the importance of continued conversation with family and their  medical providers regarding overall plan of care and treatment options, ensuring decisions are within the context of the patients values and GOCs.  Decision MakerEilleen Phillips Daughter [phone]  [phone]   SUMMARY OF RECOMMENDATIONS   DNAR/DNI  Plan for family meeting tomorrow at 2 PM  Reviewed the idea of hospice with patient --> he is clear about not wanting invasive measures at this time  Ongoing palliative care support  Code Status/Advance Care Planning: DNAR/DNI  Palliative Prophylaxis:  Aspiration, Bowel Regimen, Delirium Protocol, Frequent Pain Assessment, Oral Care, Palliative Wound Care, and Turn Reposition  Additional Recommendations (Limitations, Scope, Preferences): Continue current care  Psycho-social/Spiritual:  Desire for further Chaplaincy support: Not presently Additional Recommendations: None presently   Prognosis: Limited  Discharge Planning: Discharge once medically optimized  Vitals:   10/12/22 2057 10/13/22 0452  BP: (!) 142/72 (!) 149/76  Pulse: 98 76  Resp: 17 16  Temp: 98.1 F (36.7 C) 98 F (36.7 C)  SpO2:  98% 99%    Intake/Output Summary (Last 24 hours) at 10/13/2022 0701 Last data filed at 10/13/2022 0455 Gross per 24 hour  Intake 480 ml  Output 801 ml  Net -321 ml   Last Weight  Most recent update: 10/13/2022  5:30 AM    Weight  81 kg (178 lb 9.2 oz)            Gen: Elderly Caucasian male in no acute distress HEENT: moist mucous membranes CV: Regular rate and rhythm PULM: Cough, on 2 L nasal cannula breathing even unlabored ABD: soft/nontender EXT: No edema Neuro: Alert and oriented x3  PPS: 50-60%   This conversation/these recommendations were discussed with patient primary care team, Dr. Erlinda Hong  Total Time: 73 Billing based on MDM: High  ______________________________________________________ [place] Team Team Cell Phone: [phone] Please utilize secure chat with additional questions, if there is no response within 30 minutes please call the above phone number  Palliative Medicine Team providers are available by phone from 7am to 7pm daily and can be reached through the team cell phone.  Should this patient require assistance outside of these hours, please call the patient's attending physician.

## 2022-10-13 NOTE — Progress Notes (Signed)
Physical Therapy Treatment Patient Details Name: [name] MRN: [id number] DOB: 1928-03-16 Today's Date: 10/13/2022   History of Present Illness Pt presents to [facility] 3/13  with shortness of breath. Pt newly diagnosed wth cancer 10/12/22.   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    PT Comments    Tolerated well. SpO2 92% on 2L at rest, 82% on 2L while ambulating , 90% on 4L ambulating. Cues for RW use (has rollator at home which he greatly prefers),  good control without loss of balance. Ambulated 180 feet max with one standing rest period half-way. Very appreciative of visit. If d/c home on supplemental O2, may benefit from portable O2 concentrator to allow pt to remain active with daughter getting outdoors for QOL. Patient will continue to benefit from skilled physical therapy services to further improve independence with functional mobility.   Recommendations for follow up therapy are one component of a multi-disciplinary discharge planning process, led by the attending physician.  Recommendations may be updated based on patient status, additional functional criteria and insurance authorization.  Follow Up Recommendations  Home health PT     Assistance Recommended at Discharge Intermittent Supervision/Assistance  Patient can return home with the following A little help with walking and/or transfers;Assist for transportation;Help with stairs or ramp for entrance;Assistance with cooking/housework   Equipment Recommendations  Other (comment) (Portable oxygen concentrator if d/c home with supplemental O2.)    Recommendations for Other Services       Precautions / Restrictions Precautions Precautions: Fall Restrictions Weight Bearing Restrictions: No     Mobility  Bed Mobility Overal bed mobility: Modified Independent             General bed mobility comments: No assist needed.    Transfers Overall transfer  level: Needs assistance Equipment used: Rolling walker (2 wheels) Transfers: Sit to/from Stand Sit to Stand: Supervision           General transfer comment: Supervision for safety, stable with light use of RW for support.    Ambulation/Gait Ambulation/Gait assistance: Min guard Gait Distance (Feet): 180 Feet (+65) Assistive device: Rolling walker (2 wheels) Gait Pattern/deviations: Step-to pattern, Decreased stride length, Decreased step length - right, Decreased step length - left, Trunk flexed Gait velocity: decr Gait velocity interpretation: <1.8 ft/sec, indicate of risk for recurrent falls   General Gait Details: Educated on AD use with RW for light support, cues for upright posture, pursed lip breathing, and energy conservation. Pt required one standing rest break during intial bout at 90 feet. SpO2 82% on 2L supplemental O2, 90% on 4L supplemental O2 while ambulating. SpO2 at rest 92% on 2L. No overt LOB noted, min guard for safety throughout.   Stairs             Wheelchair Mobility    Modified Rankin (Stroke Patients Only)       Balance Overall balance assessment: Needs assistance   Sitting balance-Leahy Scale: Normal     Standing balance support: No upper extremity supported, During functional activity Standing balance-Leahy Scale: Fair Standing balance comment: Stands with close guard                            Cognition Arousal/Alertness: Awake/alert Behavior During Therapy: WFL for tasks assessed/performed Overall Cognitive Status: Within Functional Limits for tasks assessed  Exercises      General Comments General comments (skin integrity, edema, etc.): In good spirits. Pt would like to go home with portable supplemental O2.      Pertinent Vitals/Pain Pain Assessment Pain Assessment: No/denies pain    Home Living                          Prior Function             PT Goals (current goals can now be found in the care plan section) Acute Rehab PT Goals Patient Stated Goal: To return home. PT Goal Formulation: With patient Time For Goal Achievement: 10/26/22 Potential to Achieve Goals: Fair Progress towards PT goals: Progressing toward goals    Frequency    Min 3X/week      PT Plan Equipment recommendations need to be updated    Co-evaluation              AM-PAC PT "6 Clicks" Mobility   Outcome Measure  Help needed turning from your back to your side while in a flat bed without using bedrails?: None Help needed moving from lying on your back to sitting on the side of a flat bed without using bedrails?: None Help needed moving to and from a bed to a chair (including a wheelchair)?: A Little Help needed standing up from a chair using your arms (e.g., wheelchair or bedside chair)?: A Little Help needed to walk in hospital room?: A Little Help needed climbing 3-5 steps with a railing? : A Little 6 Click Score: 20    End of Session Equipment Utilized During Treatment: Gait belt;Oxygen Activity Tolerance: Patient tolerated treatment well Patient left: in chair;with call bell/phone within reach   PT Visit Diagnosis: Unsteadiness on feet (R26.81);Other abnormalities of gait and mobility (R26.89)     Time: [id number] PT Time Calculation (min) (ACUTE ONLY): 37 min  Charges:  $Gait Training: 8-22 mins $Therapeutic Activity: 8-22 mins                     Candie Mile, PT, DPT Physical Therapist Acute Rehabilitation Services [place] 10/13/2022, 3:20 PM

## 2022-10-13 NOTE — Progress Notes (Addendum)
PROGRESS NOTE    [name]  [id number] DOB: Jun 24, 1928 DOA: 10/10/2022 PCP: Libby Maw, MD     Brief Narrative:  H/o HTN, HLD, CKD4, carotid artery disease ,TIA, renal vein occlusion, neuropathy, pulmonary fibrosis presented to the hospital due to progressive weakness, short of breath since having COVID in December, found to be in hypoxic respiratory failure, saturating 70% initially    Subjective:   He is sitting up in chair on 2liter oxygen, denies sob at rest  Assessment & Plan:  Principal Problem:   Acute respiratory failure with hypoxia ([place]) Active Problems:   Essential hypertension   Carotid disease, bilateral (HCC)   Transient cerebral ischemia   Peptic ulcer   Elevated cholesterol   Fibrotic lung diseases ([place])   Stage 4 chronic kidney disease ([place])   Reactive airway disease   CAP (community acquired pneumonia) due to Pneumococcus ([place])   Elevated brain natriuretic peptide (BNP) level   Elevated troponin   Acute hypoxemic respiratory failure ([place])   ILD (interstitial lung disease) ([place])   Community acquired pneumonia of right upper lobe of lung    Assessment and Plan:  Acute hypoxic respiratory failure (not on home O2 at baseline) -O2 70% on room air on presentation -Cxr  progressive interstitial lung disease and possible superimposed pneumonia right lung apex -Respiratory viral panel negative ,procalcitonin unremarkable, urine Legionella /urine strep pneumo in process -echocardiogram results (elevated BNP) with preserved LVEF, possible diastolic dysfunction -Urine Legionella testing in process, urine strep pneumo ag negative -Continue antibiotic, steroid, received iv lasix 60mg  daily x2, does not appear grossly volume overloaded , change back to home dose lasix, continue daily weight, strict intake and output, monitor blood pressure and creatinine - pulmonology in put appreciated    Concerning for metastatic cancer on CT chest Pccm to  discuss with him and family about biopsy vs palliative care/hospice Palliative care consulted    CKD 4 > Creatinine stable at 2.36 in ED.  Euvolemic. - Continue home Lasix, bicarb -  Trend renal function and electrolytes   Hyperlipidemia History of TIA Carotid artery disease - Continue home simvastatin, Plavix   Hypertension - Continue home amlodipine and Lasix   Neuropathy - Continue home gabapentin   FTT:  PT eval recommended home health    I have Reviewed nursing notes, Vitals, pain scores, I/o's, Lab results and  imaging results since pt's last encounter, details please see discussion above  I ordered the following labs:  Unresulted Labs (From admission, onward)     Start     Ordered   10/17/22 0500  Creatinine, serum  (enoxaparin (LOVENOX)    CrCl < 30 ml/min)  Once,   R       Comments: while on enoxaparin therapy.    10/10/22 1236   10/13/22 0500  AFP tumor marker  Tomorrow morning,   R        10/12/22 1321   10/13/22 0500  CEA  Tomorrow morning,   R        10/12/22 1752   10/12/22 1310  Legionella Pneumophila Serogp 1 Ur Ag  Once,   R        10/12/22 1309             DVT prophylaxis: enoxaparin (LOVENOX) injection 30 mg Start: 10/10/22 1300   Code Status:   Code Status: DNR  Family Communication: son at bedside  Disposition:   Dispo: The patient is from: home  Anticipated d/c is to: home              Anticipated d/c date is: possible on 3/17 pending goals of care discussion,  need home O2 and possible home health vs home hospice   Antimicrobials:    Anti-infectives (From admission, onward)    Start     Dose/Rate Route Frequency Ordered Stop   10/11/22 1000  azithromycin (ZITHROMAX) tablet 500 mg        500 mg Oral Daily 10/10/22 1236 10/15/22 0959   10/11/22 1000  cefTRIAXone (ROCEPHIN) 1 g in sodium chloride 0.9 % 100 mL IVPB        1 g 200 mL/hr over 30 Minutes Intravenous Every 24 hours 10/10/22 1236 10/15/22 0959   10/10/22  1145  cefTRIAXone (ROCEPHIN) 1 g in sodium chloride 0.9 % 100 mL IVPB        1 g 200 mL/hr over 30 Minutes Intravenous  Once 10/10/22 1136 10/10/22 1222   10/10/22 1145  azithromycin (ZITHROMAX) tablet 500 mg        500 mg Oral  Once 10/10/22 1136 10/10/22 1221          Objective: Vitals:   10/13/22 0452 10/13/22 0500 10/13/22 0800 10/13/22 1637  BP: (!) 149/76  128/66 (!) 141/66  Pulse: 76  65 63  Resp: 16  16 20   Temp: 98 F (36.7 C)  97.8 F (36.6 C) (!) 97.3 F (36.3 C)  TempSrc: Oral  Oral Oral  SpO2: 99%  98% 100%  Weight:  81 kg      Intake/Output Summary (Last 24 hours) at 10/13/2022 1750 Last data filed at 10/13/2022 0900 Gross per 24 hour  Intake --  Output 601 ml  Net -601 ml   Filed Weights   10/11/22 0459 10/12/22 0353 10/13/22 0500  Weight: 81.1 kg 80.5 kg 81 kg    Examination:  General exam: alert, awake, communicative,calm, NAD, slightly hard of hearing Respiratory system: Mild bibasilar crackles, no wheezing, no rales, no rhonchi. Respiratory effort normal. Cardiovascular system:  RRR.  Gastrointestinal system: Abdomen is nondistended, soft and nontender.  Normal bowel sounds heard. Central nervous system: Alert and oriented. No focal neurological deficits. Extremities:  no edema Skin: No rashes, lesions or ulcers Psychiatry: Judgement and insight appear normal. Mood & affect appropriate.     Data Reviewed: I have personally reviewed  labs and visualized  imaging studies since the last encounter and formulate the plan        Scheduled Meds:  amLODipine  10 mg Oral Daily   azithromycin  500 mg Oral Daily   clopidogrel  75 mg Oral q morning   enoxaparin (LOVENOX) injection  30 mg Subcutaneous Daily   furosemide  20 mg Oral Daily   gabapentin  300 mg Oral BID   predniSONE  50 mg Oral Q breakfast   simvastatin  10 mg Oral QHS   sodium bicarbonate  650 mg Oral BID   sodium chloride flush  3 mL Intravenous Q12H   Continuous Infusions:   cefTRIAXone (ROCEPHIN)  IV 1 g (10/13/22 0939)     LOS: 2 days   Time spent: 2mins  Florencia Reasons, MD PhD FACP [facility]  Available via Epic secure chat 7am-7pm for nonurgent issues Please page for urgent issues To page the attending provider between 7A-7P or the covering provider during after hours 7P-7A, please log into the web site [email or web address] and access using universal [place] password for that web site.  If you do not have the password, please call the hospital operator.    10/13/2022, 5:50 PM

## 2022-10-14 DIAGNOSIS — Z515 Encounter for palliative care: Secondary | ICD-10-CM | POA: Diagnosis not present

## 2022-10-14 DIAGNOSIS — Z7189 Other specified counseling: Secondary | ICD-10-CM

## 2022-10-14 LAB — CEA: CEA: 187 ng/mL — ABNORMAL HIGH (ref 0.0–4.7)

## 2022-10-14 LAB — AFP TUMOR MARKER: AFP, Serum, Tumor Marker: <1.8 ng/mL (ref 0.0–6.4)

## 2022-10-14 MED ORDER — PREDNISONE 20 MG PO TABS
40.0000 mg | ORAL_TABLET | Freq: Every day | ORAL | Status: DC
  Administered 2022-10-15 – 2022-10-16 (×2): 40 mg via ORAL
  Filled 2022-10-14 (×2): qty 2

## 2022-10-14 NOTE — Progress Notes (Signed)
   Palliative Medicine Inpatient Follow Up Note   HPI:  [name] is a 89 y.o. male with medical history significant of CKD 4, TIA, retinal vein occlusion, neuropathy, hypertension, hyperlipidemia, reactive airway disease, peptic ulcer disease, fibrotic lung disease, carotid artery disease presenting with shortness of breath. CT chest results are concerning for metastatic cancer. Palliative care consulted for further goals of care conversations regarding the possibility of further medical workup versus hospice care.   Today's Discussion 10/14/2022  *Please note that this is a verbal dictation therefore any spelling or grammatical errors are due to the "[employer] One" system interpretation.  Chart reviewed inclusive of vital signs, progress notes, laboratory results, and diagnostic images.   A family meeting was held this afternoon with [name], his daughters, [name] and [name]. We reviewed patients recent CT results. We discussed the likelihood that he has metastatic cancer. We discussed the idea of a biopsy and the potential risks associated with this as they had already had these conversations with the pulmonology team.   Per review of [name]'s wishes he does not desire additional workup of the lesions identified on his CT imaging. He would rather prefer to transition home with hospice.   I described hospice as a service for patients who have a life expectancy of 6 months or less.  The goal of hospice is the preservation of dignity and quality at the end phases of life. Under hospice care, the focus changes from curative to symptom relief.   Patient and his children are in agreement with this plan. They hope to further discuss which hospice agency they would prefer to use. At this time they are deciding between [facility] and [facility].   Questions and concerns addressed/Palliative Support Provided.   Objective Assessment: Vital Signs Vitals:   10/14/22 0432 10/14/22  0844  BP: 136/72 137/64  Pulse: 70 61  Resp: 17 16  Temp: 98.2 F (36.8 C) (!) 97.5 F (36.4 C)  SpO2: 100% 96%    Intake/Output Summary (Last 24 hours) at 10/14/2022 1517 Last data filed at 10/14/2022 0845 Gross per 24 hour  Intake 200 ml  Output 425 ml  Net -225 ml   Last Weight  Most recent update: 10/14/2022  5:59 AM    Weight  83 kg (182 lb 15.7 oz)            Gen: Elderly Caucasian male in no acute distress HEENT: moist mucous membranes CV: Regular rate and rhythm PULM: On 2 L nasal cannula breathing even unlabored ABD: soft/nontender EXT: No edema Neuro: Alert and oriented x3  SUMMARY OF RECOMMENDATIONS   DNAR/DNI   Patient does not want further workup via biopsy, he would like to go home and die a natural death   Patient and his children are deciding between [facility] and [facility] --> Have asked TOC for FU on Monday  PMT will continue to follow along and support [name] as needed  Time Spent: 83 Billing based on MDM: High ______________________________________________________________________________________ [place] Team Team Cell Phone: [phone] Please utilize secure chat with additional questions, if there is no response within 30 minutes please call the above phone number  Palliative Medicine Team providers are available by phone from 7am to 7pm daily and can be reached through the team cell phone.  Should this patient require assistance outside of these hours, please call the patient's attending physician.

## 2022-10-14 NOTE — Progress Notes (Signed)
PROGRESS NOTE    [name]  [id number] DOB: Mar 24, 1928 DOA: 10/10/2022 PCP: Libby Maw, MD     Brief Narrative:  H/o HTN, HLD, CKD4, carotid artery disease ,TIA, renal vein occlusion, neuropathy, pulmonary fibrosis presented to the hospital due to progressive weakness, short of breath since having COVID in December, found to be in hypoxic respiratory failure, saturating 70% initially    Subjective:  No over night event reported, he is  to have family meeting today with palliative care  He is  on 2liter oxygen, denies sob at rest  Assessment & Plan:  Principal Problem:   Acute respiratory failure with hypoxia ([place]) Active Problems:   Essential hypertension   Carotid disease, bilateral (HCC)   Transient cerebral ischemia   Peptic ulcer   Elevated cholesterol   Fibrotic lung diseases ([place])   Stage 4 chronic kidney disease ([place])   Reactive airway disease   CAP (community acquired pneumonia) due to Pneumococcus ([place])   Elevated brain natriuretic peptide (BNP) level   Elevated troponin   Acute hypoxemic respiratory failure ([place])   ILD (interstitial lung disease) ([place])   Community acquired pneumonia of right upper lobe of lung    Assessment and Plan:  Acute hypoxic respiratory failure (not on home O2 at baseline) -O2 70% on room air on presentation -Cxr  progressive interstitial lung disease and possible superimposed pneumonia right lung apex -Respiratory viral panel negative ,procalcitonin unremarkable, urine Legionella in process, urine strep pneumo ag negative -echocardiogram results (elevated BNP) with preserved LVEF, possible diastolic dysfunction, received iv lasix 60mg  daily x2, does not appear grossly volume overloaded , change back to home dose lasix, continue daily weight, strict intake and output,  -finished  antibiotic x5 days,  steroid taper to off in 5 days from 3/16 - pulmonology input appreciated    Concerning for metastatic cancer on  CT chest Pccm to discuss with him and family about biopsy vs palliative care/hospice Palliative care consulted , patient is leaning towards home hospice.   CKD 4 > Creatinine stable at basline - Continue home Lasix, bicarb -renal dosing meds   Hyperlipidemia History of TIA Carotid artery disease - Continue home simvastatin, Plavix   Hypertension - Continue home amlodipine and Lasix   Neuropathy - Continue home gabapentin   FTT:  PT eval recommended home health , continue goals of care discussion     I have Reviewed nursing notes, Vitals, pain scores, I/o's, Lab results and  imaging results since pt's last encounter, details please see discussion above  I ordered the following labs:  Unresulted Labs (From admission, onward)     Start     Ordered   10/17/22 0500  Creatinine, serum  (enoxaparin (LOVENOX)    CrCl < 30 ml/min)  Once,   R       Comments: while on enoxaparin therapy.    10/10/22 1236   10/13/22 0500  AFP tumor marker  Tomorrow morning,   R        10/12/22 1321   10/13/22 0500  CEA  Tomorrow morning,   R        10/12/22 1752   10/12/22 1310  Legionella Pneumophila Serogp 1 Ur Ag  Once,   R        10/12/22 1309             DVT prophylaxis: enoxaparin (LOVENOX) injection 30 mg Start: 10/10/22 1300   Code Status:   Code Status: DNR  Family Communication: patient  Disposition:   Dispo: The patient is from: home              Anticipated d/c is to:  Plan to discharge home with home hospice on Monday or Tuesday, once things set up at home, need home oxygen               Antimicrobials:    Anti-infectives (From admission, onward)    Start     Dose/Rate Route Frequency Ordered Stop   10/11/22 1000  azithromycin (ZITHROMAX) tablet 500 mg        500 mg Oral Daily 10/10/22 1236 10/14/22 0847   10/11/22 1000  cefTRIAXone (ROCEPHIN) 1 g in sodium chloride 0.9 % 100 mL IVPB        1 g 200 mL/hr over 30 Minutes Intravenous Every 24 hours 10/10/22 1236  10/15/22 0959   10/10/22 1145  cefTRIAXone (ROCEPHIN) 1 g in sodium chloride 0.9 % 100 mL IVPB        1 g 200 mL/hr over 30 Minutes Intravenous  Once 10/10/22 1136 10/10/22 1222   10/10/22 1145  azithromycin (ZITHROMAX) tablet 500 mg        500 mg Oral  Once 10/10/22 1136 10/10/22 1221          Objective: Vitals:   10/13/22 2000 10/14/22 0432 10/14/22 0500 10/14/22 0844  BP: 132/60 136/72  137/64  Pulse: 72 70  61  Resp: 18 17  16   Temp: 97.6 F (36.4 C) 98.2 F (36.8 C)  (!) 97.5 F (36.4 C)  TempSrc: Oral Oral  Oral  SpO2: 100% 100%  96%  Weight:   83 kg     Intake/Output Summary (Last 24 hours) at 10/14/2022 0912 Last data filed at 10/14/2022 0433 Gross per 24 hour  Intake 200 ml  Output 200 ml  Net 0 ml   Filed Weights   10/12/22 0353 10/13/22 0500 10/14/22 0500  Weight: 80.5 kg 81 kg 83 kg    Examination:  General exam: AAOx3 NAD, slightly hard of hearing, very sharp minded 89 year gentlemen  Respiratory system: Mild bibasilar crackles, no wheezing, no rales, no rhonchi. Respiratory effort normal. Cardiovascular system:  RRR.  Gastrointestinal system: Abdomen is nondistended, soft and nontender.  Normal bowel sounds heard. Central nervous system: Alert and oriented. No focal neurological deficits. Extremities:  no edema Skin: No rashes, lesions or ulcers Psychiatry: Judgement and insight appear normal. Mood & affect appropriate.     Data Reviewed: I have personally reviewed  labs and visualized  imaging studies since the last encounter and formulate the plan        Scheduled Meds:  amLODipine  10 mg Oral Daily   clopidogrel  75 mg Oral q morning   enoxaparin (LOVENOX) injection  30 mg Subcutaneous Daily   furosemide  20 mg Oral Daily   gabapentin  300 mg Oral BID   [START ON 10/15/2022] predniSONE  40 mg Oral Q breakfast   simvastatin  10 mg Oral QHS   sodium bicarbonate  650 mg Oral BID   sodium chloride flush  3 mL Intravenous Q12H    Continuous Infusions:  cefTRIAXone (ROCEPHIN)  IV 1 g (10/14/22 0851)     LOS: 3 days   Time spent: 56mins  Florencia Reasons, MD PhD FACP [facility]  Available via Epic secure chat 7am-7pm for nonurgent issues Please page for urgent issues To page the attending provider between 7A-7P or the covering provider during after hours 7P-7A, please  log into the web site [email or web address] and access using universal [place] password for that web site. If you do not have the password, please call the hospital operator.    10/14/2022, 9:12 AM

## 2022-10-14 NOTE — Plan of Care (Signed)
  Problem: Respiratory: Goal: Ability to maintain adequate ventilation will improve Outcome: Not Progressing Goal: Ability to maintain a clear airway will improve Outcome: Not Progressing   Problem: Safety: Goal: Ability to remain free from injury will improve Outcome: Not Progressing

## 2022-10-15 DIAGNOSIS — J9601 Acute respiratory failure with hypoxia: Secondary | ICD-10-CM | POA: Diagnosis not present

## 2022-10-15 NOTE — Progress Notes (Signed)
   This pt has been referred to [facility] for services at home. I have spoken to the pt's daughter whom he lives with and introduced her to hospice services and the assistance we can offer in the home. She confirms the goals are comfort approach at home. They have a rollator in home. She is requesting oxygen and wheelchair. These were ordered via [employer] for delivery today. Pt's daughter is aware that she needs to bring oxygen with her tomorrow when she picks the pt  up to transport him home by car.   The pt has been approved for hospice care by our MD at home. Webb Silversmith RN [phone]

## 2022-10-15 NOTE — Plan of Care (Signed)

## 2022-10-15 NOTE — Progress Notes (Signed)
PROGRESS NOTE  [name]  DOB: 06/09/1928  PCP: Libby Maw, MD [id number]  DOA: 10/10/2022  LOS: 4 days  Hospital Day: 6  Brief narrative: [name] is a 89 y.o. male with PMH significant for HTN, HLD, CKD4, carotid artery disease ,TIA, renal vein occlusion, neuropathy, pulmonary fibrosis  3/13, patient was brought to the hospital due to progressively worsening weakness, short of breath since COVID infection in December In the ED, she was found to be in hypoxic respiratory failure, saturating 70% initially.  Started on supplemental oxygen. Initial labs with WBC count elevated 12.7, BNP elevated, procalcitonin level negative. Chest x-ray showed progressive interstitial lung disease and possible superimposed pneumonia in the right lung apex Respiratory virus panel unremarkable Admitted to [facility] for acute respiratory failure with hypoxia. She was started on empiric IV antibiotics, tapering course of steroid On further workup, echocardiogram showed preserved LVEF, diastolic dysfunction.  Given IV Lasix x 2. 3/15, CT chest was obtained with significant findings as below concerning for metastatic cancer.  New large solid pulmonary nodules in both lower lobes concerning for metastasis.   Numerous new liver liver lesions, peritoneal nodularity concerning for primary malignancy  PCCM was consulted. Considering the extent of her disease, comorbidities and overall frail health, patient and family did not want to pursue biopsy and aggressive treatment plan. Palliative care was consulted.  Family leaning towards home hospice.  Subjective: Patient was seen and examined this afternoon.  Pleasant elderly Caucasian male.  Lying down in bed.  On low-flow oxygen.  Talking to his son [name] on the phone.  No new complaint.  They are making preparation for discharge to home with hospice tomorrow.  Assessment and plan: Comfort care status Primarily admitted for respiratory failure.   Workup showed metastatic cancer with pulmonary mets After discussion with PCCM and palliative care, family chose comfort care status and home hospice. Comfort care order set in place.  Other medical issues:  CKD 4 Creatinine stable at basline Continue home Lasix, bicarb   Hyperlipidemia History of TIA Carotid artery disease Continue home simvastatin, Plavix   Hypertension Continue home amlodipine and Lasix   Neuropathy Continue home gabapentin   Mobility: Limited mobility  Goals of care   Code Status: DNR  Comfort care status   DVT prophylaxis:  enoxaparin (LOVENOX) injection 30 mg Start: 10/10/22 1300   Antimicrobials: None Fluid: None currently Consultants: Palliative care Family Communication: Son on the phone  Status: Inpatient Level of care:  Med-Surg   Needs to continue in-hospital care:  Arrangements pending for discharge to home with hospice  Prognosis:  Poor  Patient from: Home Anticipated d/c to: Home with hospice tomorrow     Scheduled Meds:  amLODipine  10 mg Oral Daily   clopidogrel  75 mg Oral q morning   enoxaparin (LOVENOX) injection  30 mg Subcutaneous Daily   furosemide  20 mg Oral Daily   gabapentin  300 mg Oral BID   predniSONE  40 mg Oral Q breakfast   simvastatin  10 mg Oral QHS   sodium bicarbonate  650 mg Oral BID   sodium chloride flush  3 mL Intravenous Q12H    PRN meds: acetaminophen **OR** acetaminophen, polyethylene glycol   Infusions:    Diet:  Diet Order             Diet Heart Room service appropriate? Yes; Fluid consistency: Thin; Fluid restriction: 2000 mL Fluid  Diet effective now  Antimicrobials: Anti-infectives (From admission, onward)    Start     Dose/Rate Route Frequency Ordered Stop   10/11/22 1000  azithromycin (ZITHROMAX) tablet 500 mg        500 mg Oral Daily 10/10/22 1236 10/14/22 0847   10/11/22 1000  cefTRIAXone (ROCEPHIN) 1 g in sodium chloride 0.9 % 100 mL IVPB         1 g 200 mL/hr over 30 Minutes Intravenous Every 24 hours 10/10/22 1236 10/14/22 0921   10/10/22 1145  cefTRIAXone (ROCEPHIN) 1 g in sodium chloride 0.9 % 100 mL IVPB        1 g 200 mL/hr over 30 Minutes Intravenous  Once 10/10/22 1136 10/10/22 1222   10/10/22 1145  azithromycin (ZITHROMAX) tablet 500 mg        500 mg Oral  Once 10/10/22 1136 10/10/22 1221       Skin assessment:       Nutritional status:  Body mass index is 27.82 kg/m.          Objective: Vitals:   10/15/22 0449 10/15/22 0820  BP: 134/69 (!) 140/66  Pulse: 60 (!) 56  Resp: 18 17  Temp: (!) 97.5 F (36.4 C) (!) 97.4 F (36.3 C)  SpO2: 98% 98%    Intake/Output Summary (Last 24 hours) at 10/15/2022 1000 Last data filed at 10/15/2022 0450 Gross per 24 hour  Intake 220 ml  Output 450 ml  Net -230 ml   Filed Weights   10/12/22 0353 10/13/22 0500 10/14/22 0500  Weight: 80.5 kg 81 kg 83 kg   Weight change:  Body mass index is 27.82 kg/m.   Physical Exam: General exam: Pleasant, elderly male.  Pain controlled comfortable I did not do a detailed examination because of Comfort Care status.    Data Review: I have personally reviewed the laboratory data and studies available.  F/u labs  Unresulted Labs (From admission, onward)     Start     Ordered   10/17/22 0500  Creatinine, serum  (enoxaparin (LOVENOX)    CrCl < 30 ml/min)  Once,   R       Comments: while on enoxaparin therapy.    10/10/22 1236   10/12/22 1310  Legionella Pneumophila Serogp 1 Ur Ag  Once,   R        10/12/22 1309            Total time spent in review of labs and imaging, patient evaluation, formulation of plan, documentation and communication with family: 66 minutes  Signed, Terrilee Croak, MD [facility] 10/15/2022

## 2022-10-15 NOTE — Telephone Encounter (Signed)
Appt has been canceled.

## 2022-10-15 NOTE — Progress Notes (Signed)
Physical Therapy Treatment Patient Details Name: [name] MRN: [id number] DOB: 02/21/1928 Today's Date: 10/15/2022   History of Present Illness Pt presents to [facility] 3/13  with shortness of breath. Pt newly diagnosed wth cancer 10/12/22.   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    PT Comments    Pt is progressing towards goals. Brought rollator to pt room today which pt uses at home and pt was Mod I for bed mobility, transfers. Requires supervision with gait only to monitor O2 due to dropping on 2L -5 L of O2 via [place] with gait. Pt recovers quickly and has no signs/symptoms of respiratory distress with O2 sats down to 84% with a good pleth line. Pt educated to monitor O2 due to he has no symptoms with only a mild elevation in HR. Due to pt current functional status, home set up and available assistance at home currently pt does not require any skilled physical therapy services on discharge from acute care hospital setting. Discussed the importance of short distance ambulation and notifying PCP if any changes in balance/strength in order to remain as functional as he can be as long as possible.     Recommendations for follow up therapy are one component of a multi-disciplinary discharge planning process, led by the attending physician.  Recommendations may be updated based on patient status, additional functional criteria and insurance authorization.  Follow Up Recommendations  No PT follow up     Assistance Recommended at Discharge Intermittent Supervision/Assistance  Patient can return home with the following Assist for transportation;Help with stairs or ramp for entrance;Assistance with cooking/housework   Equipment Recommendations  Other (comment) (portable O2 concentrator)    Recommendations for Other Services       Precautions / Restrictions Precautions Precautions: Fall Precaution Comments: watch O2  sats Restrictions Weight Bearing Restrictions: No     Mobility  Bed Mobility Overal bed mobility: Modified Independent             General bed mobility comments: No assist needed. Patient Response: Cooperative  Transfers Overall transfer level: Modified independent Equipment used: Rollator (4 wheels) Transfers: Sit to/from Stand Sit to Stand: Modified independent (Device/Increase time)   Step pivot transfers: Modified independent (Device/Increase time)       General transfer comment: Pt used rollator today at Mod I very safe with appropriate use of brakes and hand placement.    Ambulation/Gait Ambulation/Gait assistance: Supervision Gait Distance (Feet): 250 Feet Assistive device: Rollator (4 wheels) Gait Pattern/deviations: Step-to pattern, Decreased stride length, Decreased step length - right, Decreased step length - left, Trunk flexed Gait velocity: Decreased cadence. Gait velocity interpretation: <1.8 ft/sec, indicate of risk for recurrent falls   General Gait Details: Supervision required due to respiratory status. Pt immediatley drops to 85% O2 sats with any mobility on 2 L O2. Pt has quick recovery. Pt was up to 5 L Of O2 via [place] with 250 ft of gait with slightly elevated HR from 68 to 93 bpm and O2 dropping down to 84% with less than 15 second recovery. Pleth line was even with good wave formation. Pt reports no symptoms when asked and had signs of increased respirations or accessory muscle use.        Balance Overall balance assessment: Mild deficits observed, not formally tested           Standing balance-Leahy Scale: Good Standing balance comment: Pt does well with rollator.  Cognition Arousal/Alertness: Awake/alert Behavior During Therapy: WFL for tasks assessed/performed Overall Cognitive Status: Within Functional Limits for tasks assessed           General Comments General comments (skin integrity, edema, etc.): Pt is moving well.  Brought rollator for pt to try and pt was Mod I for most functional mobility when using a rollator.      Pertinent Vitals/Pain Pain Assessment Pain Assessment: No/denies pain     PT Goals (current goals can now be found in the care plan section) Acute Rehab PT Goals Patient Stated Goal: To return home. PT Goal Formulation: With patient Time For Goal Achievement: 10/26/22 Potential to Achieve Goals: Fair Progress towards PT goals: Progressing toward goals    Frequency    Min 3X/week      PT Plan Current plan remains appropriate;Discharge plan needs to be updated       AM-PAC PT "6 Clicks" Mobility   Outcome Measure  Help needed turning from your back to your side while in a flat bed without using bedrails?: None Help needed moving from lying on your back to sitting on the side of a flat bed without using bedrails?: None Help needed moving to and from a bed to a chair (including a wheelchair)?: None Help needed standing up from a chair using your arms (e.g., wheelchair or bedside chair)?: None Help needed to walk in hospital room?: A Little Help needed climbing 3-5 steps with a railing? : A Little 6 Click Score: 22    End of Session Equipment Utilized During Treatment: Gait belt;Oxygen Activity Tolerance: Patient tolerated treatment well Patient left: in chair;with call bell/phone within reach Nurse Communication: Mobility status PT Visit Diagnosis: Unsteadiness on feet (R26.81);Other abnormalities of gait and mobility (R26.89)     Time: [id number] PT Time Calculation (min) (ACUTE ONLY): 32 min  Charges:  $Gait Training: 8-22 mins $Therapeutic Activity: 8-22 mins                     Tomma Rakers, DPT, CLT  Acute Rehabilitation Services Office: [phone] (Secure chat preferred)    [name] 10/15/2022, 10:14 AM

## 2022-10-15 NOTE — Telephone Encounter (Signed)
This visit can be cancelled. He was sent home on hospice from the hospital.  Thanks, JD

## 2022-10-15 NOTE — TOC Initial Note (Addendum)
Transition of Care (TOC) - Initial/Assessment Note   Spoke to patient at bedside and daughter Eilleen Kempf via speaker phone. Patient and family have decided to go home with hospice through [facility].   Patient has a rollator at home already, will need home oxygen.   Patient does not feel that he needs a hospital bed at this time.   NCM will make a referral to [facility] and provide Sandra's contact information. Once [facility] reviews case and patient accepted they will order home oxygen   Patient plans to go home by private car . Portable oxygen tank will be delivered to hospital room prior to discharge. Katharine Look states she was told by palliative team team discharge would not be until 10/16/22   NCM called [facility] . Left message awaiting call back.   Cheri with [facility] returned call and will start working on referral for home with hospice  Patient Details  Name: [name] MRN: [id number] Date of Birth: 1927-09-25  Transition of Care [facility]) CM/SW Contact:    Marilu Favre, RN Phone Number: 10/15/2022, 10:40 AM  Clinical Narrative:                   Expected Discharge Plan: [place]     Patient Goals and CMS Choice Patient states their goals for this hospitalization and ongoing recovery are:: to return to home CMS Medicare.gov Compare Post Acute Care list provided to:: Patient Choice offered to / list presented to : Patient [place] ownership interest in [facility].provided to:: Patient    Expected Discharge Plan and Services   Discharge Planning Services: CM Consult Post Acute Care Choice: Hospice Living arrangements for the past 2 months: [place] [facility] [place] Date [place]: 10/15/22 Time [place]: 1039 Representative spoke with at [place]: [facility]  Arrangements/Services Living arrangements for the past 2 months: [place] Lives with:: Adult Children Patient language and need for interpreter reviewed:: Yes Do you feel safe going back to the place where you live?: Yes      Need for Family Participation in Patient Care: Yes (Comment) Care giver support system in place?: Yes (comment) Current home services: DME Criminal Activity/Legal Involvement Pertinent to Current Situation/Hospitalization: No - Comment as needed  Activities of Daily Living      Permission Sought/Granted   Permission granted to share information with : Yes, Verbal Permission Granted  Share Information with NAME: daughter Eilleen Kempf [id number]  Permission granted to share info w AGENCY: [facility]        Emotional Assessment Appearance:: Appears stated age Attitude/Demeanor/Rapport: Engaged Affect (typically observed): Accepting Orientation: : Oriented to Self, Oriented to Place, Oriented to  Time, Oriented to Situation Alcohol / Substance Use: Not Applicable Psych Involvement: No (comment)  Admission diagnosis:  Chronic lung disease [J98.4] Acute respiratory failure with hypoxia (HCC) [J96.01] Pneumonia of right upper lobe due to infectious organism [J18.9] Acute hypoxemic respiratory failure (HCC) [J96.01] Patient Active Problem List   Diagnosis Date Noted   Acute hypoxemic respiratory failure ([place]) 10/11/2022   ILD (interstitial lung disease) ([place]) 10/11/2022   Community acquired pneumonia of right upper lobe of lung 10/11/2022   Acute  respiratory failure with hypoxia ([place]) 10/10/2022   CAP (community acquired pneumonia) due to Pneumococcus ([place]) 10/10/2022   Elevated brain natriuretic peptide (BNP) level 10/10/2022   Elevated troponin 10/10/2022   Snores 10/05/2022   Reactive airway disease 09/13/2022   COVID-19 07/26/2022   Stage 4 chronic kidney disease ([place]) 03/13/2022   Elevated glucose 01/19/2021   Vitamin B 12  deficiency 01/19/2021   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   Fall 10/18/2020   Fibrotic lung diseases ([place]) 10/18/2020   Slow transit constipation 04/05/2020   Abnormal CT of the chest 05/22/2019   Vitamin D deficiency 05/22/2019   Edema 01/27/2019   Bilateral rales 01/27/2019   Weight gain 01/01/2019   Elevated cholesterol 05/19/2018   Bradycardia 03/17/2018   Neuropathy 12/05/2017   Transient cerebral ischemia 10/06/2007   Carotid disease, bilateral ([place]) 01/27/2007   PAIN IN JOINT, MULTIPLE SITES 01/09/2007   BRANCH RETINAL VEIN OCCLUSION 11/19/2006   Hearing loss 11/19/2006   Essential hypertension 11/19/2006   Peptic ulcer 11/19/2006   PCP:  Libby Maw, MD Pharmacy:   [facility] - [place], [place] - [address] [place] [address] Phone: [phone] Fax: [phone]     Social Determinants of Health (SDOH) Social History: SDOH Screenings   Food Insecurity: No Food Insecurity (04/10/2022)  Housing: Low Risk  (04/10/2022)  Transportation Needs: No Transportation Needs (04/10/2022)  Utilities: Not At Risk (04/10/2022)  Alcohol Screen: Low Risk  (04/10/2022)  Depression (PHQ2-9): Low Risk  (09/13/2022)  Financial Resource Strain: Low Risk  (04/10/2022)  Physical Activity: Insufficiently Active (04/10/2022)  Social Connections: Socially Isolated (04/10/2022)  Stress: No Stress Concern Present (04/10/2022)  Tobacco Use: Low Risk  (10/10/2022)   SDOH Interventions:     Readmission Risk Interventions     No data to display

## 2022-10-15 NOTE — Care Management Important Message (Signed)
Important Message  Patient Details  Name: [name] MRN: [id number] Date of Birth: 26-Feb-1928   Medicare Important Message Given:  Yes     Hannah Beat 10/15/2022, 11:55 AM

## 2022-10-16 LAB — LEGIONELLA PNEUMOPHILA SEROGP 1 UR AG: L. pneumophila Serogp 1 Ur Ag: NEGATIVE

## 2022-10-16 NOTE — Discharge Summary (Signed)
Physician Discharge Summary  MAHDI HALTIWANGER [id number] DOB: [birth year] DOA: 10/10/2022  PCP: Libby Maw, MD  Admit date: 10/10/2022 Discharge date: 10/16/2022  Admitted From: Home Discharge disposition: Home with hospice  Recommendations at discharge:  Per hospice policy   Brief narrative: [name] is a 89 y.o. male with PMH significant for HTN, HLD, CKD4, carotid artery disease ,TIA, renal vein occlusion, neuropathy, pulmonary fibrosis  3/13, patient was brought to the hospital due to progressively worsening weakness, short of breath since COVID infection in December In the ED, she was found to be in hypoxic respiratory failure, saturating 70% initially.  Started on supplemental oxygen. Initial labs with WBC count elevated 12.7, BNP elevated, procalcitonin level negative. Chest x-ray showed progressive interstitial lung disease and possible superimposed pneumonia in the right lung apex Respiratory virus panel unremarkable Admitted to [facility] for acute respiratory failure with hypoxia. She was started on empiric IV antibiotics, tapering course of steroid On further workup, echocardiogram showed preserved LVEF, diastolic dysfunction.  Given IV Lasix x 2. 3/15, CT chest was obtained with significant findings as below concerning for metastatic cancer.  New large solid pulmonary nodules in both lower lobes concerning for metastasis.   Numerous new liver liver lesions, peritoneal nodularity concerning for primary malignancy  PCCM was consulted. Considering the extent of her disease, comorbidities and overall frail health, patient and family did not want to pursue biopsy and aggressive treatment plan. Palliative care was consulted.  Family leaning towards home hospice.  Subjective: Patient was seen and examined this morning..  Pleasant elderly Caucasian male.  Lying down in bed.  On low-flow oxygen.  His family is coming to pick him up this afternoon.  Excited about  going home  Hospital course: Comfort care status Primarily admitted for respiratory failure.  Workup showed metastatic cancer with pulmonary mets After discussion with PCCM and palliative care, family chose comfort care status and home hospice. Comfort care order set in place.   Other medical issues:  CKD 4 Creatinine stable at basline Continue home Lasix, bicarb as tolerated   Hyperlipidemia History of TIA Carotid artery disease Continue home simvastatin, Plavix as tolerated   Hypertension Continue home amlodipine and Lasix as tolerated   Neuropathy Continue home gabapentin as tolerated   Goals of care   Code Status: DNR  Comfort care status  Wounds:  -    Discharge Exam:   Vitals:   10/15/22 1617 10/15/22 2008 10/16/22 0449 10/16/22 0817  BP: (!) 141/73 (!) 144/70 139/68 139/72  Pulse: 64 66 60 (!) 54  Resp: 17 18 18 18   Temp: (!) 97.3 F (36.3 C) (!) 97.4 F (36.3 C) 97.6 F (36.4 C) (!) 97.5 F (36.4 C)  TempSrc: Oral Oral Oral Oral  SpO2: 100% 98% 100% 100%  Weight:   80.1 kg     Body mass index is 26.85 kg/m.   General exam: Pleasant, elderly male.  Pain controlled comfortable I did not do a detailed examination because of Comfort Care status.    Follow ups:    Follow-up Information     [facility] Follow up.   Why: Home Hospice services Contact information: [address] [place] [phone]                Discharge Instructions:   Discharge Instructions     Activity as tolerated - No restrictions   Complete by: As directed    Call MD for:  Complete by: As directed    Please get in touch with hospice MD/nurse for any symptom control.   Diet general   Complete by: As directed    If patient is alert and awake enough to eat, can allow luxury feeding.   Discharge instructions   Complete by: As directed    Medicines intended for comfort and pain management prescribed. Rest  of the care per hospice policy.       Discharge Medications:   Allergies as of 10/16/2022       Reactions   Catapres [clonidine] Other (See Comments)   Dry mouth   Codeine Other (See Comments)   Unknown reaction   Zestril [lisinopril] Other (See Comments)   Hyperkalemia    Penicillins Rash        Medication List     TAKE these medications    acetaminophen 500 MG tablet Commonly known as: TYLENOL Take 500 mg by mouth 2 (two) times daily as needed for mild pain, moderate pain, fever or headache.   amLODipine 10 MG tablet Commonly known as: NORVASC TAKE ONE TABLET BY MOUTH EVERY MORNING What changed: when to take this   ASCORBIC ACID PO Take 1 tablet by mouth daily. Vitamin C, unknown strength.   clopidogrel 75 MG tablet Commonly known as: PLAVIX TAKE ONE TABLET BY MOUTH EVERY MORNING What changed: when to take this   dextromethorphan-guaiFENesin 30-600 MG 12hr tablet Commonly known as: MUCINEX DM Take 1 tablet by mouth 2 (two) times daily as needed for cough. With a tall glass of water   DRY EYES OP Place 1 drop into both eyes 2 (two) times daily as needed (dry eyes).   furosemide 20 MG tablet Commonly known as: LASIX Take 1 tablet (20 mg total) by mouth daily.   gabapentin 300 MG capsule Commonly known as: NEURONTIN Take 1 capsule (300 mg total) by mouth 3 (three) times daily. Take one capsule three times daily   Lokelma 10 g Pack packet Generic drug: sodium zirconium cyclosilicate USE ONE powder PACKET dissolved in liquid ONCE WEEKLY ON MONDAY AND ONCE WEEKLY ON FRIDAY AS DIRECTED What changed:  how much to take how to take this when to take this additional instructions   simvastatin 10 MG tablet Commonly known as: ZOCOR TAKE ONE TABLET BY MOUTH EVERYDAY AT BEDTIME What changed: See the new instructions.   sodium bicarbonate 650 MG tablet Take 1 tablet (650 mg total) by mouth 2 (two) times daily.   VITAMIN B-12 PO Take 1 tablet by mouth  daily.   VITAMIN D-3 PO Take 1 capsule by mouth daily.         The results of significant diagnostics from this hospitalization (including imaging, microbiology, ancillary and laboratory) are listed below for reference.    Procedures and Diagnostic Studies:   ECHOCARDIOGRAM COMPLETE  Result Date: 10/11/2022    ECHOCARDIOGRAM REPORT   Patient Name:   SUREN PICKERT [facility] Date of Exam: 10/11/2022 Medical Rec #:  [id number]       Height:       68.0 in Accession #:    [id number]      Weight:       178.8 lb Date of Birth:  26-Mar-1928       BSA:          1.949 m Patient Age:    38 years        BP:           140/68 mmHg Patient Gender: M  HR:           84 bpm. Exam Location:  Inpatient Procedure: 2D Echo, Cardiac Doppler and Color Doppler Indications:    dyspnea  History:        Patient has prior history of Echocardiogram examinations. TIA;                 Risk Factors:Hypertension.  Sonographer:    Phineas Douglas Referring Phys: [id number] [place]  1. Left ventricular ejection fraction, by estimation, is 60 to 65%. The left ventricle has normal function. Left ventricular endocardial border not optimally defined to evaluate regional wall motion. There is moderate left ventricular hypertrophy. Indeterminate diastolic filling due to E-A fusion.  2. Right ventricular systolic function is mildly reduced. The right ventricular size is mildly enlarged. There is normal pulmonary artery systolic pressure.  3. Left atrial size was mildly dilated.  4. The mitral valve is normal in structure. Trivial mitral valve regurgitation. No evidence of mitral stenosis.  5. The aortic valve is grossly normal. There is mild calcification of the aortic valve. There is moderate thickening of the aortic valve. Aortic valve regurgitation is trivial. No aortic stenosis is present.  6. Hypoechoic intraabdominal region adjacent to the liver, likely represents ascites vs hepatic cyst.  7. The inferior vena cava  is normal in size with greater than 50% respiratory variability, suggesting right atrial pressure of 3 mmHg.  8. Cannot exclude a small PFO. FINDINGS  Left Ventricle: Left ventricular ejection fraction, by estimation, is 60 to 65%. The left ventricle has normal function. Left ventricular endocardial border not optimally defined to evaluate regional wall motion. The left ventricular internal cavity size was normal in size. There is moderate left ventricular hypertrophy. Indeterminate diastolic filling due to E-A fusion. Right Ventricle: The right ventricular size is mildly enlarged. No increase in right ventricular wall thickness. Right ventricular systolic function is mildly reduced. There is normal pulmonary artery systolic pressure. The tricuspid regurgitant velocity  is 2.51 m/s, and with an assumed right atrial pressure of 3 mmHg, the estimated right ventricular systolic pressure is [id number] mmHg. Left Atrium: Left atrial size was mildly dilated. Right Atrium: Right atrial size was normal in size. Pericardium: Trivial pericardial effusion is present. Mitral Valve: The mitral valve is normal in structure. Trivial mitral valve regurgitation. No evidence of mitral valve stenosis. Tricuspid Valve: The tricuspid valve is normal in structure. Tricuspid valve regurgitation is mild . No evidence of tricuspid stenosis. Aortic Valve: The aortic valve is grossly normal. There is mild calcification of the aortic valve. There is moderate thickening of the aortic valve. Aortic valve regurgitation is trivial. No aortic stenosis is present. Pulmonic Valve: The pulmonic valve was normal in structure. Pulmonic valve regurgitation is trivial. No evidence of pulmonic stenosis. Aorta: The aortic root is normal in size and structure. Ascending aorta measurements are within normal limits for age when indexed to body surface area. Venous: The inferior vena cava is normal in size with greater than 50% respiratory variability, suggesting  right atrial pressure of 3 mmHg. IAS/Shunts: The interatrial septum appears to be lipomatous. Cannot exclude a small PFO. Additional Comments: Moderate ascites is present.  LEFT VENTRICLE PLAX 2D LVIDd:         4.40 cm      Diastology LVIDs:         2.90 cm      LV e' medial:    6.64 cm/s LV PW:  1.50 cm      LV E/e' medial:  10.3 LV IVS:        1.50 cm      LV e' lateral:   7.51 cm/s LVOT diam:     2.10 cm      LV E/e' lateral: 9.1 LV SV:         69 LV SV Index:   35 LVOT Area:     3.46 cm  LV Volumes (MOD) LV vol d, MOD A2C: 107.0 ml LV vol d, MOD A4C: 107.0 ml LV vol s, MOD A2C: 46.5 ml LV vol s, MOD A4C: 49.0 ml LV SV MOD A2C:     60.5 ml LV SV MOD A4C:     107.0 ml LV SV MOD BP:      60.6 ml RIGHT VENTRICLE             IVC RV Basal diam:  4.30 cm     IVC diam: 1.70 cm RV S prime:     12.50 cm/s TAPSE (M-mode): 1.9 cm LEFT ATRIUM             Index        RIGHT ATRIUM           Index LA diam:        4.30 cm 2.21 cm/m   RA Area:     17.80 cm LA Vol (A2C):   65.6 ml 33.66 ml/m  RA Volume:   44.70 ml  22.94 ml/m LA Vol (A4C):   68.6 ml 35.20 ml/m LA Biplane Vol: 74.0 ml 37.97 ml/m  AORTIC VALVE LVOT Vmax:   96.70 cm/s LVOT Vmean:  57.500 cm/s LVOT VTI:    0.198 m  AORTA Ao Root diam: 3.50 cm Ao Asc diam:  3.70 cm MITRAL VALVE                TRICUSPID VALVE MV Area (PHT): 3.87 cm     TR Peak grad:   25.2 mmHg MV Decel Time: 196 msec     TR Vmax:        251.00 cm/s MV E velocity: 68.10 cm/s MV A velocity: 102.00 cm/s  SHUNTS MV E/A ratio:  0.67         Systemic VTI:  0.20 m                             Systemic Diam: 2.10 cm Cherlynn Kaiser MD Electronically signed by Cherlynn Kaiser MD Signature Date/Time: 10/11/2022/10:34:13 AM    Final    DG Chest Port 1 View  Result Date: 10/10/2022 CLINICAL DATA:  Increasing shortness of breath over the past 3 weeks. Had [place] in December. EXAM: PORTABLE CHEST 1 VIEW COMPARISON:  CT chest dated Dec 23, 2019. Chest x-ray dated May 15, 2019. FINDINGS: The heart  remains at the upper limits of normal in size. Normal pulmonary vascularity. Peripheral and basilar predominant interstitial thickening has worsened since 2021. Hazy density overlying the right lung apex. No pleural effusion or pneumothorax. No acute osseous abnormality. IMPRESSION: 1. Progressive chronic interstitial lung disease. 2. Hazy density overlying the right lung apex, nonspecific, but concerning for pneumonia. Electronically Signed   By: Titus Dubin M.D.   On: 10/10/2022 10:34     Labs:   Basic Metabolic Panel: Recent Labs  Lab 10/10/22 1003 10/11/22 0117 10/12/22 0335 10/13/22 0223  NA 137 134* 134* 135  K 4.1 5.1 4.5 4.3  CL 102 103 101 100  CO2 23 23 25 25   GLUCOSE 117* 133* 139* 98  BUN 33* 39* 51* 61*  CREATININE 2.36* 2.43* 2.47* 2.52*  CALCIUM 8.8* 8.4* 8.3* 8.2*  MG  --  2.2  --   --   PHOS  --   --  4.3  --    GFR Estimated Creatinine Clearance: 17.7 mL/min (A) (by C-G formula based on SCr of 2.52 mg/dL (H)). Liver Function Tests: Recent Labs  Lab 10/10/22 1003 10/11/22 0117  AST 17 14*  ALT 8 8  ALKPHOS 134* 109  BILITOT 0.6 0.7  PROT 7.3 6.7  ALBUMIN 3.4* 2.9*   No results for input(s): "LIPASE", "AMYLASE" in the last 168 hours. No results for input(s): "AMMONIA" in the last 168 hours. Coagulation profile No results for input(s): "INR", "PROTIME" in the last 168 hours.  CBC: Recent Labs  Lab 10/10/22 1003 10/11/22 0117 10/12/22 0335 10/13/22 0223  WBC 12.7* 7.5 14.1* 12.2*  NEUTROABS  --   --  12.5* 10.8*  HGB 13.0 11.5* 10.9* 11.0*  HCT 41.4 36.3* 34.6* 35.8*  MCV 85.7 85.4 85.6 86.9  PLT 244 214 247 274   Cardiac Enzymes: No results for input(s): "CKTOTAL", "CKMB", "CKMBINDEX", "TROPONINI" in the last 168 hours. BNP: Invalid input(s): "POCBNP" CBG: No results for input(s): "GLUCAP" in the last 168 hours. D-Dimer No results for input(s): "DDIMER" in the last 72 hours. Hgb A1c No results for input(s): "HGBA1C" in the last 72  hours. Lipid Profile No results for input(s): "CHOL", "HDL", "LDLCALC", "TRIG", "CHOLHDL", "LDLDIRECT" in the last 72 hours. Thyroid function studies No results for input(s): "TSH", "T4TOTAL", "T3FREE", "THYROIDAB" in the last 72 hours.  Invalid input(s): "FREET3" Anemia work up No results for input(s): "VITAMINB12", "FOLATE", "FERRITIN", "TIBC", "IRON", "RETICCTPCT" in the last 72 hours. Microbiology Recent Results (from the past 240 hour(s))  Resp panel by RT-PCR (RSV, Flu A&B, Covid) Anterior Nasal Swab     Status: None   Collection Time: 10/10/22  9:55 AM   Specimen: Anterior Nasal Swab  Result Value Ref Range Status   SARS Coronavirus 2 by RT PCR NEGATIVE NEGATIVE Final   Influenza A by PCR NEGATIVE NEGATIVE Final   Influenza B by PCR NEGATIVE NEGATIVE Final    Comment: (NOTE) The Xpert Xpress SARS-CoV-2/FLU/RSV plus assay is intended as an aid in the diagnosis of influenza from Nasopharyngeal swab specimens and should not be used as a sole basis for treatment. Nasal washings and aspirates are unacceptable for Xpert Xpress SARS-CoV-2/FLU/RSV testing.  Fact Sheet for Patients: EntrepreneurPulse.com.au  Fact Sheet for Healthcare Providers: IncredibleEmployment.be  This test is not yet approved or cleared by the [place] FDA and has been authorized for detection and/or diagnosis of SARS-CoV-2 by FDA under an Emergency Use Authorization (EUA). This EUA will remain in effect (meaning this test can be used) for the duration of the COVID-19 declaration under Section 564(b)(1) of the Act, 21 U.S.C. section 360bbb-3(b)(1), unless the authorization is terminated or revoked.     Resp Syncytial Virus by PCR NEGATIVE NEGATIVE Final    Comment: (NOTE) Fact Sheet for Patients: EntrepreneurPulse.com.au  Fact Sheet for Healthcare Providers: IncredibleEmployment.be  This test is not yet approved or cleared  by the [place] FDA and has been authorized for detection and/or diagnosis of SARS-CoV-2 by FDA under an Emergency Use Authorization (EUA). This EUA will remain in effect (meaning this test can be used) for the duration of the COVID-19 declaration under Section  564(b)(1) of the Act, 21 U.S.C. section 360bbb-3(b)(1), unless the authorization is terminated or revoked.  Performed at [facility], [place] [address], [place], [place] [address]   Respiratory (~20 pathogens) panel by PCR     Status: None   Collection Time: 10/11/22  6:40 PM   Specimen: Nasopharyngeal Swab; Respiratory  Result Value Ref Range Status   Adenovirus NOT DETECTED NOT DETECTED Final   Coronavirus 229E NOT DETECTED NOT DETECTED Final    Comment: (NOTE) The Coronavirus on the Respiratory Panel, DOES NOT test for the novel  Coronavirus (2019 nCoV)    Coronavirus HKU1 NOT DETECTED NOT DETECTED Final   Coronavirus NL63 NOT DETECTED NOT DETECTED Final   Coronavirus OC43 NOT DETECTED NOT DETECTED Final   Metapneumovirus NOT DETECTED NOT DETECTED Final   Rhinovirus / Enterovirus NOT DETECTED NOT DETECTED Final   Influenza A NOT DETECTED NOT DETECTED Final   Influenza B NOT DETECTED NOT DETECTED Final   Parainfluenza Virus 1 NOT DETECTED NOT DETECTED Final   Parainfluenza Virus 2 NOT DETECTED NOT DETECTED Final   Parainfluenza Virus 3 NOT DETECTED NOT DETECTED Final   Parainfluenza Virus 4 NOT DETECTED NOT DETECTED Final   Respiratory Syncytial Virus NOT DETECTED NOT DETECTED Final   Bordetella pertussis NOT DETECTED NOT DETECTED Final   Bordetella Parapertussis NOT DETECTED NOT DETECTED Final   Chlamydophila pneumoniae NOT DETECTED NOT DETECTED Final   Mycoplasma pneumoniae NOT DETECTED NOT DETECTED Final    Comment: Performed at [facility], [place]. [address], [place], [place] [address]    Time coordinating discharge: 45 minutes  Signed: Emmalynne Courtney  [facility] 10/16/2022, 10:54 AM\

## 2022-10-16 NOTE — TOC Progression Note (Signed)
Transition of Care (TOC) - Progression Note   Received message from [facility] with [facility] , oxygen was delivered to home. They ordered some smaller portable tanks to be delivered today per daughters  request. Daughter will  bring the portable tank when she pocks him up today . Secure chatted team.   Patient Details  Name: [name] MRN: [id number] Date of Birth: 04-Oct-1927  Transition of Care [facility]) CM/SW Contact  Haley Fuerstenberg, Edson Snowball, RN Phone Number: 10/16/2022, 10:04 AM  Clinical Narrative:       Expected Discharge Plan: [place]    Expected Discharge Plan and Services   Discharge Planning Services: CM Consult Post Acute Care Choice: Hospice Living arrangements for the past 2 months: [place] Date [place]: 10/15/22 Time [place]: [place] Representative spoke with at [place]: [place] Determinants of Health ([place]) Interventions SDOH Screenings   Food Insecurity: No Food Insecurity (04/10/2022)  Housing: Low Risk  (04/10/2022)  Transportation Needs: No Transportation Needs (04/10/2022)  Utilities: Not At Risk (04/10/2022)  Alcohol Screen: Low Risk  (04/10/2022)  Depression (PHQ2-9): Low Risk  (09/13/2022)  Financial Resource Strain: Low Risk  (04/10/2022)  Physical Activity: Insufficiently Active (04/10/2022)  Social Connections: Socially Isolated (04/10/2022)  Stress: No Stress Concern Present (04/10/2022)  Tobacco Use: Low Risk  (10/10/2022)    Readmission Risk Interventions     No data to display

## 2022-10-18 ENCOUNTER — Telehealth: Payer: Self-pay

## 2022-10-18 NOTE — Transitions of Care (Post Inpatient/ED Visit) (Signed)
   10/18/2022  Name: LONNEL JARED MRN: [id number] DOB: 01-Mar-1928  Today's TOC FU Call Status: Today's TOC FU Call Status:: Successful TOC FU Call Competed TOC FU Call Complete Date: 10/18/22  Transition Care Management Follow-up Telephone Call Date of Discharge: 10/16/22 Discharge Facility: [facility]) Type of Discharge: Emergency Department How have you been since you were released from the hospital?: Better Any questions or concerns?: No  Items Reviewed: Did you receive and understand the discharge instructions provided?: Yes Medications obtained and verified?: No Any new allergies since your discharge?: No Dietary orders reviewed?: NA Do you have support at home?: Yes  Home Care and Equipment/Supplies: [place] Ordered?: NA Any new equipment or medical supplies ordered?: NA  Functional Questionnaire: Do you need assistance with bathing/showering or dressing?: No Do you need assistance with meal preparation?: No Do you need assistance with eating?: No Do you have difficulty maintaining continence: No Do you need assistance with getting out of bed/getting out of a chair/moving?: No Do you have difficulty managing or taking your medications?: No  Follow up appointments reviewed: PCP Follow-up appointment confirmed?: NA (pt under hospice care now) [facility] Follow-up appointment confirmed?: NA Do you need transportation to your follow-up appointment?: No Do you understand care options if your condition(s) worsen?: Yes-patient verbalized understanding    SIGNATURE Angeline Slim, BSN, RN

## 2022-10-23 ENCOUNTER — Ambulatory Visit: Payer: Self-pay

## 2022-10-23 NOTE — Chronic Care Management (AMB) (Signed)
   10/23/2022  [name] [birth year] [id number]   Reason for Encounter: CCM status changed to previously enrolled   [place] Manager/Chronic Care Management [phone]

## 2022-10-30 ENCOUNTER — Institutional Professional Consult (permissible substitution): Payer: Medicare Other | Admitting: Pulmonary Disease

## 2022-11-06 ENCOUNTER — Other Ambulatory Visit: Payer: Self-pay | Admitting: Family Medicine

## 2022-11-06 DIAGNOSIS — G629 Polyneuropathy, unspecified: Secondary | ICD-10-CM

## 2022-11-06 DIAGNOSIS — I6523 Occlusion and stenosis of bilateral carotid arteries: Secondary | ICD-10-CM

## 2022-11-06 DIAGNOSIS — E7800 Pure hypercholesterolemia, unspecified: Secondary | ICD-10-CM

## 2022-11-06 DIAGNOSIS — I10 Essential (primary) hypertension: Secondary | ICD-10-CM

## 2022-11-28 DEATH — deceased

## 2022-12-13 ENCOUNTER — Ambulatory Visit: Payer: Medicare Other | Admitting: Family Medicine

## 2023-01-07 ENCOUNTER — Ambulatory Visit: Payer: Medicare Other | Admitting: Family Medicine

## 2023-03-12 IMAGING — CT CT HEAD W/O CM
3 series · 14 of 47 positions shown, 16 images · non-contrast
Comparison: None.

CLINICAL DATA: [AGE] male with neck trauma.

EXAM:
CT HEAD WITHOUT CONTRAST
CT CERVICAL SPINE WITHOUT CONTRAST
TECHNIQUE: Multidetector CT imaging of the head and cervical spine was
performed following the standard protocol without intravenous
contrast. Multiplanar CT image reconstructions of the cervical spine
were also generated.

[Series 2: head 5.0 h30s · axial · 0.42mm/px · z∈[-163,-38]mm · 8 of 31 slices shown, 10 images]
[im 3/31  brain]
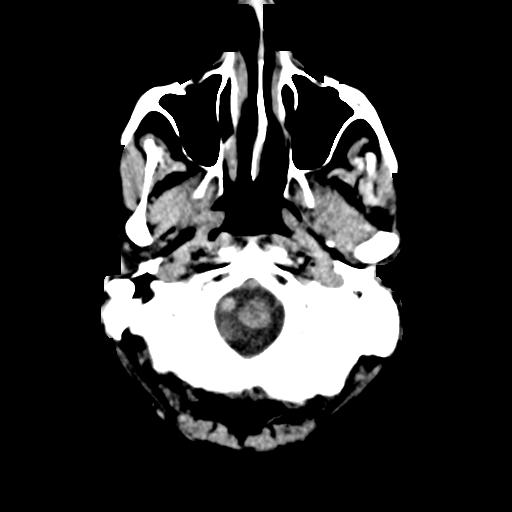
[im 3/31  bone]
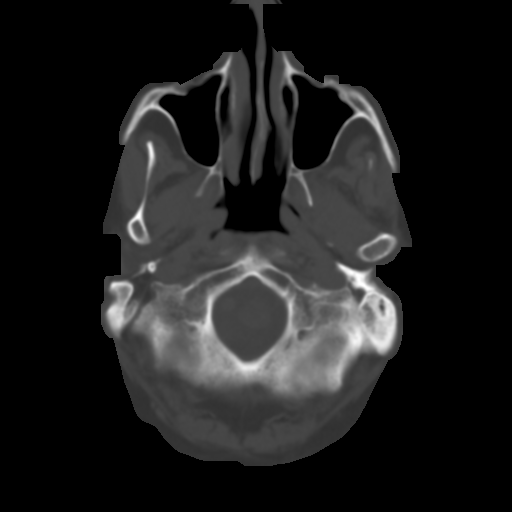
[im 7/31  brain]
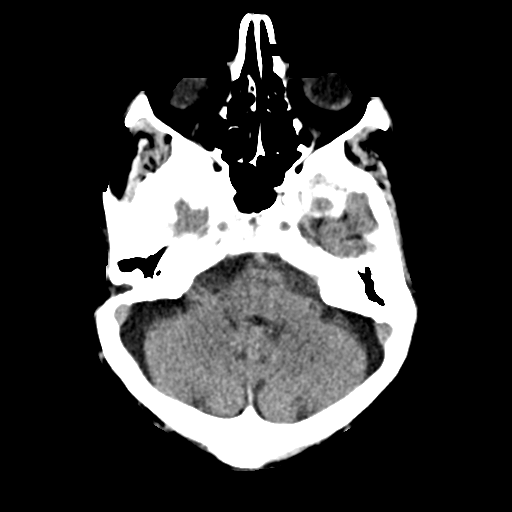
[im 10/31  brain]
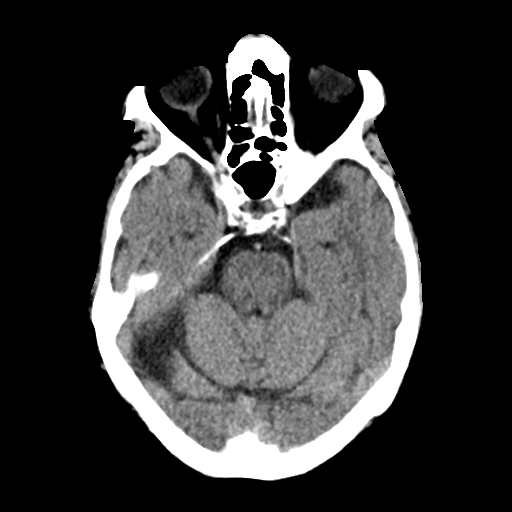
[im 14/31  brain]
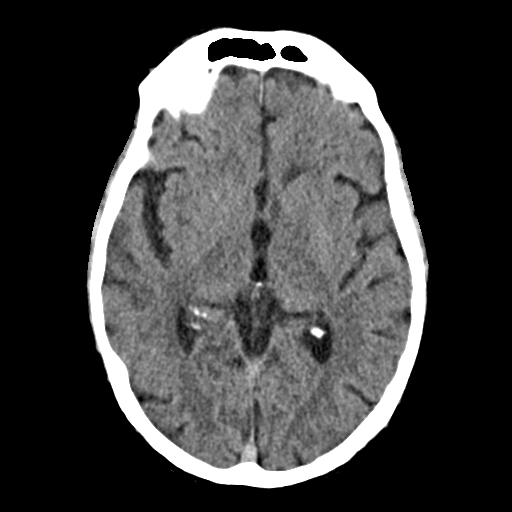
[im 17/31  brain]
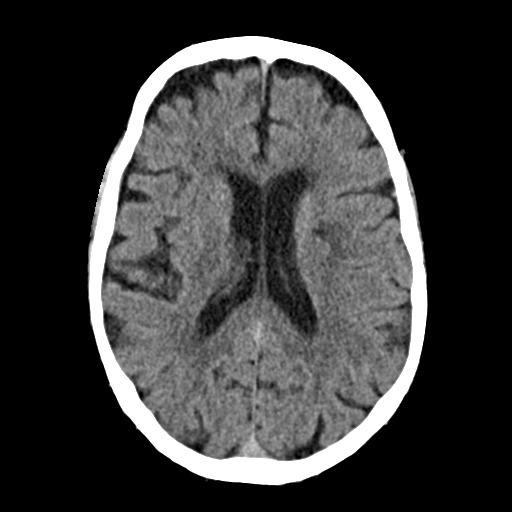
[im 17/31  bone]
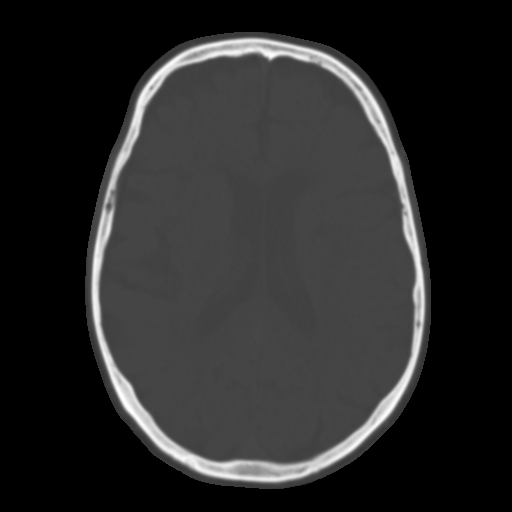
[im 21/31  brain]
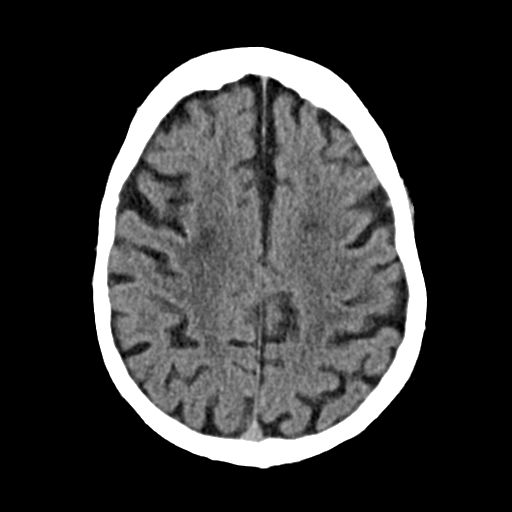
[im 24/31  brain]
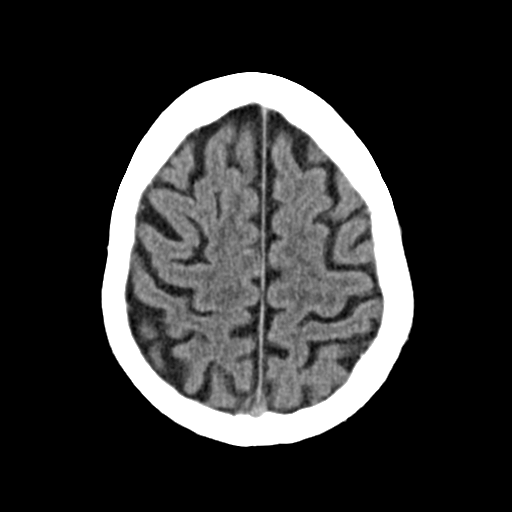
[im 28/31  brain]
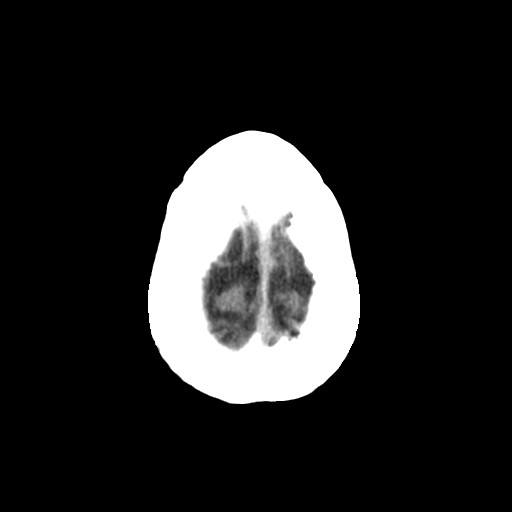

[Series 4: head 3.0 mpr cor · coronal · 0.31mm/px · 3 of 67 slices shown]
[im 23/67  brain]
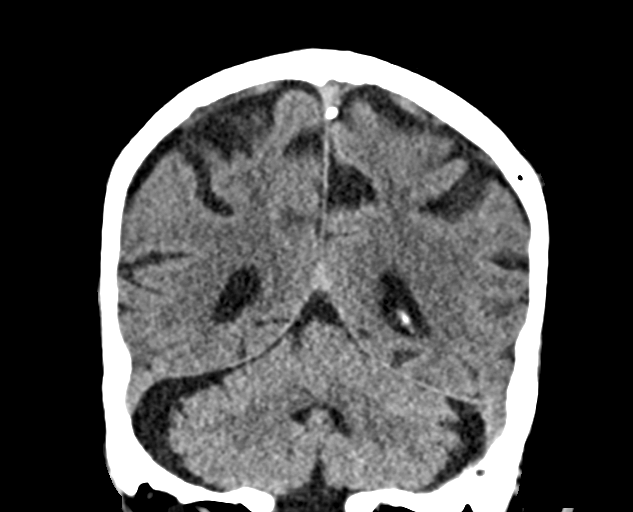
[im 30/67  brain]
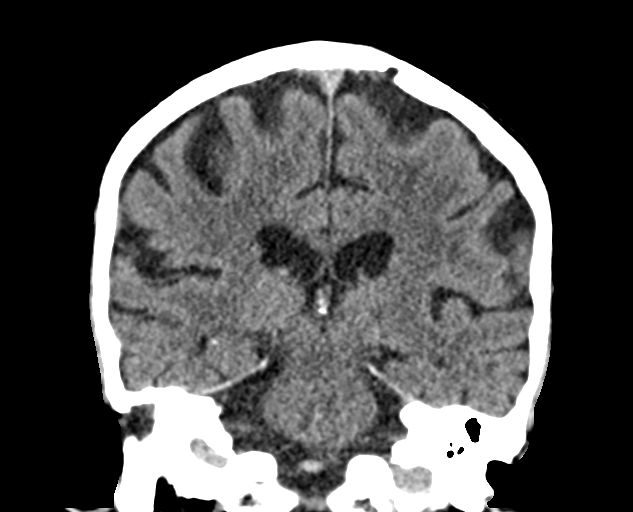
[im 37/67  brain]
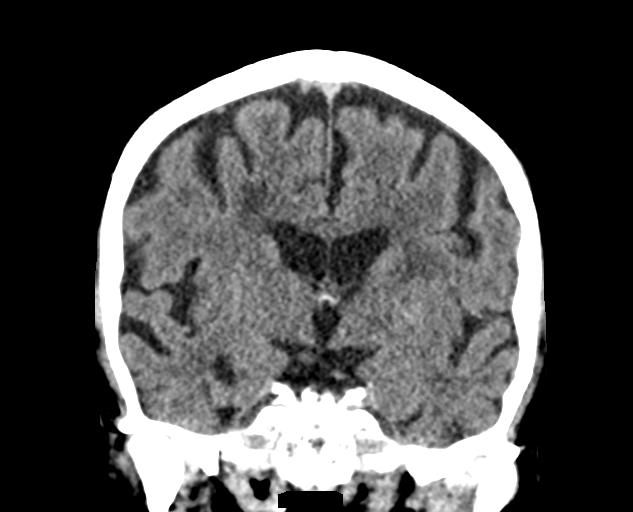

[Series 5: head 3.0 mpr sag · sagittal · 0.32mm/px · 3 of 67 slices shown]
[im 23/67  brain]
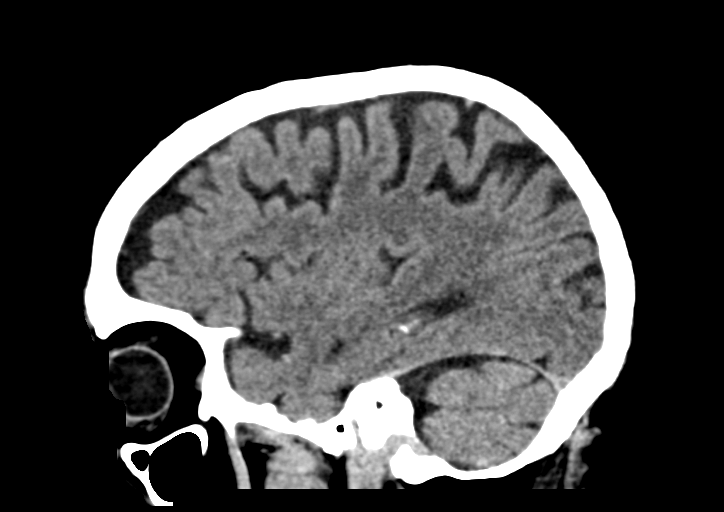
[im 34/67  brain]
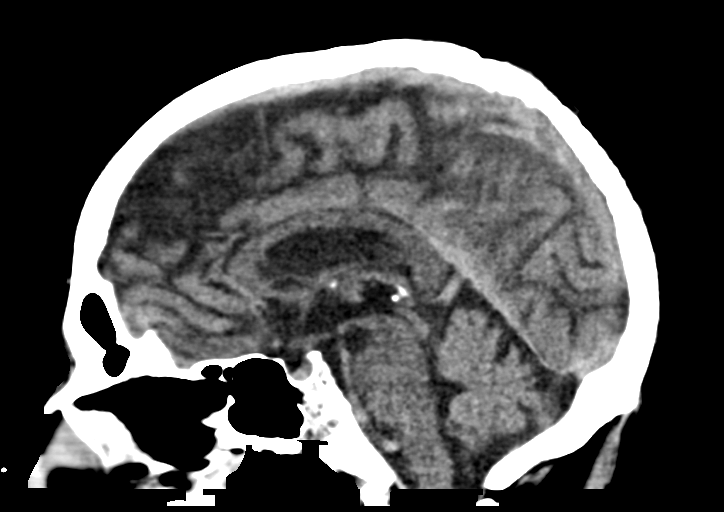
[im 45/67  brain]
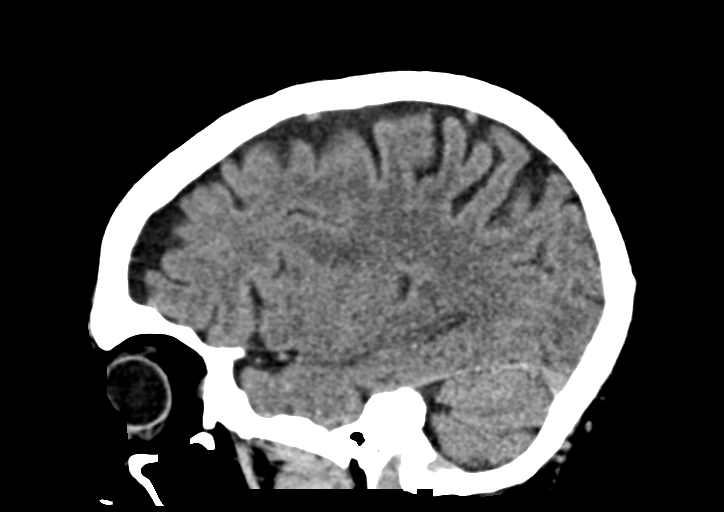

[14 of 47 positions shown; findings below may reference images not displayed]

FINDINGS: CT HEAD FINDINGS

Brain: Mild age-related atrophy and chronic microvascular ischemic
changes. There is no acute intracranial hemorrhage. No mass effect
or midline shift. No extra-axial fluid collection.

Vascular: No hyperdense vessel or unexpected calcification.

Skull: Normal. Negative for fracture or focal lesion.

Sinuses/Orbits: The visualized paranasal sinuses and the left
mastoid air cells are clear. Prior right mastectomy.

Other: None

CT CERVICAL SPINE FINDINGS

Alignment: No acute subluxation.

Skull base and vertebrae: No acute fracture.  Osteopenia.

Soft tissues and spinal canal: No prevertebral fluid or swelling. No
visible canal hematoma.

Disc levels:  Multilevel degenerative changes.

Upper chest: Not visualized.

Other: Left carotid bulb calcified plaques. Probable prior right
carotid endarterectomy.
IMPRESSION: 1. No acute intracranial pathology. Mild age-related atrophy and
chronic microvascular ischemic changes.
2. No acute/traumatic cervical spine pathology. Multilevel
degenerative changes.

## 2023-03-12 IMAGING — CT CT CERVICAL SPINE W/O CM
4 series · 14 of 33 positions shown, 17 images · non-contrast
Comparison: None.

CLINICAL DATA: [AGE] male with neck trauma.

EXAM:
CT HEAD WITHOUT CONTRAST
CT CERVICAL SPINE WITHOUT CONTRAST
TECHNIQUE: Multidetector CT imaging of the head and cervical spine was
performed following the standard protocol without intravenous
contrast. Multiplanar CT image reconstructions of the cervical spine
were also generated.

[Series 3: c_spine 2.0 i30s 3 · axial · 0.26mm/px · z∈[-255,-151]mm · 5 of 80 slices shown, 7 images]
[im 14/80  soft-tissue]
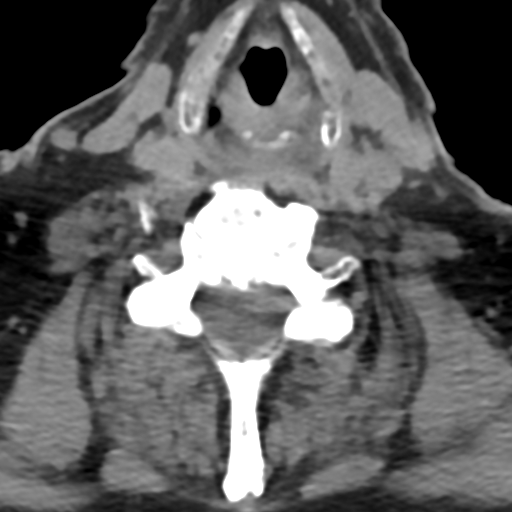
[im 14/80  bone]
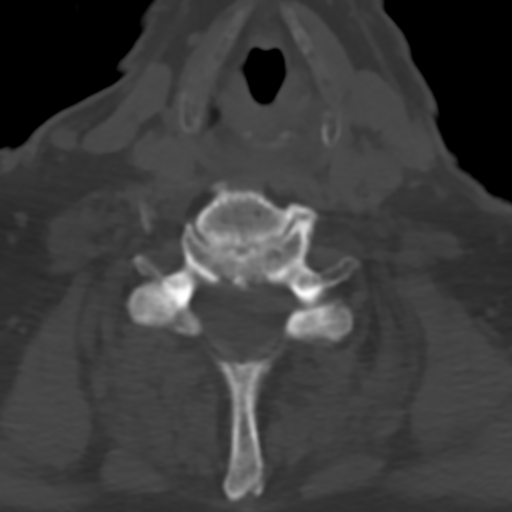
[im 27/80  bone]
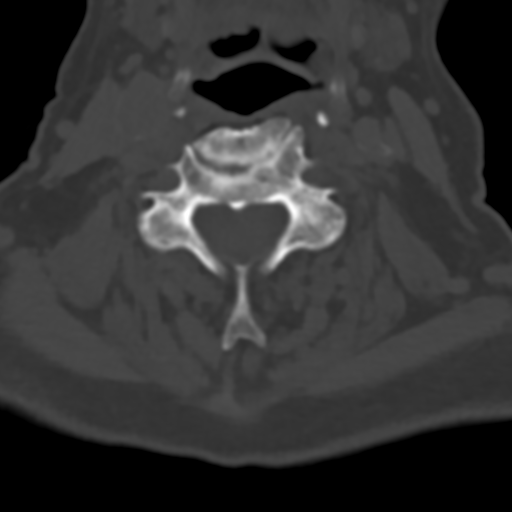
[im 40/80  bone]
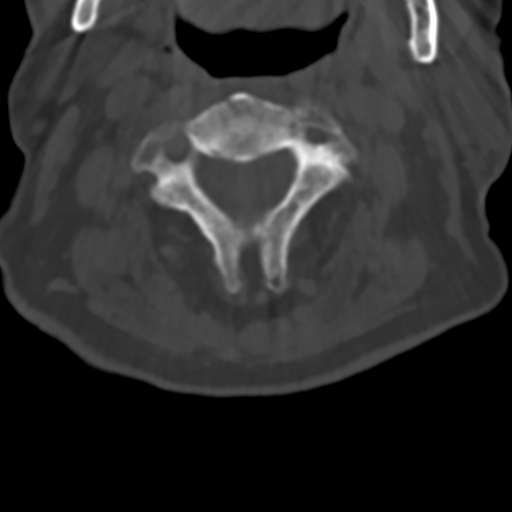
[im 53/80  bone]
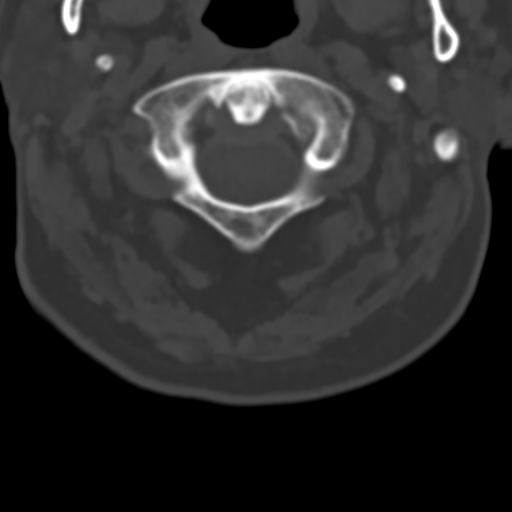
[im 66/80  soft-tissue]
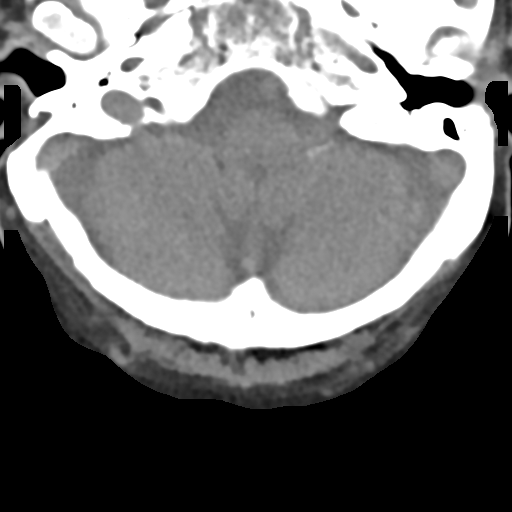
[im 66/80  bone]
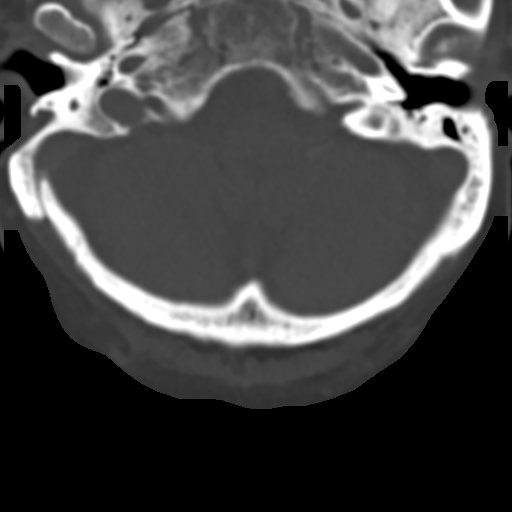

[Series 5: coronals · coronal · 0.28mm/px · 3 of 61 slices shown]
[im 13/61  bone]
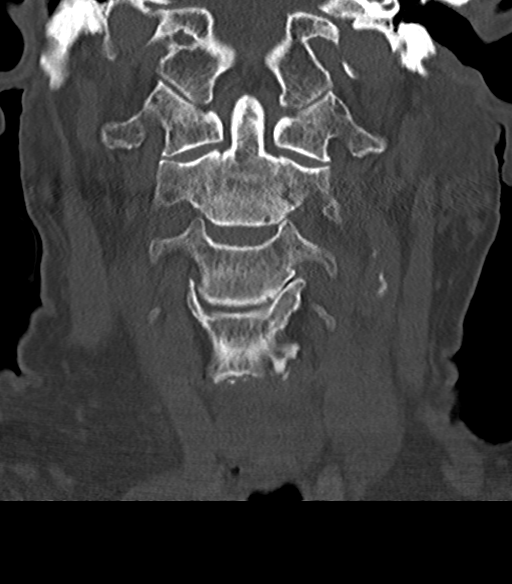
[im 25/61  bone]
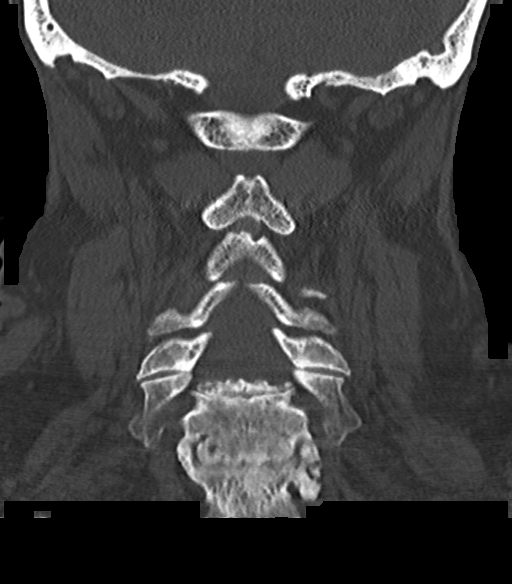
[im 37/61  bone]
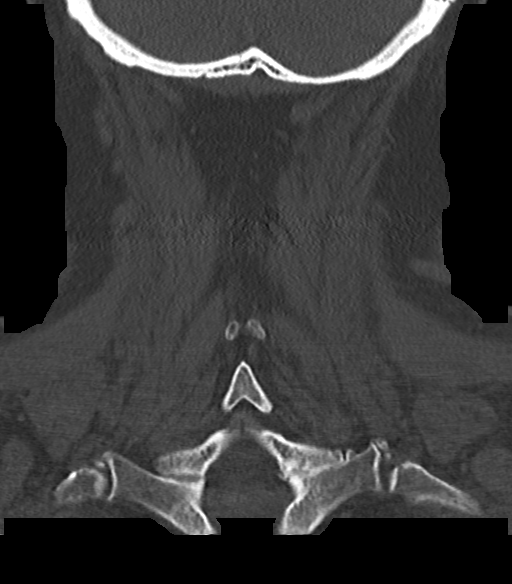

[Series 6: sagittals · sagittal · 0.27mm/px · 5 of 61 slices shown, 6 images]
[im 21/61  bone]
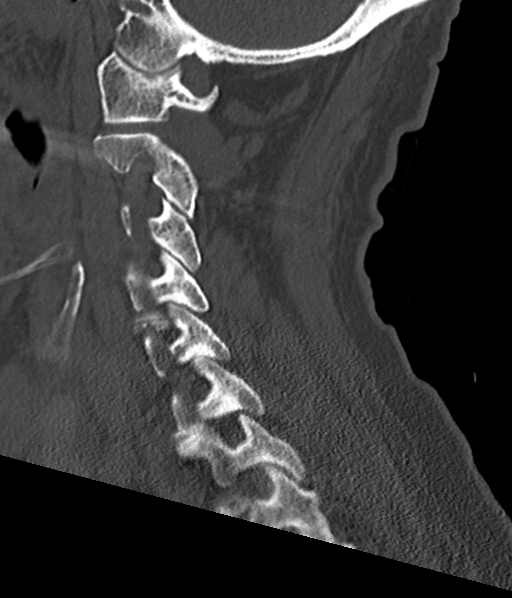
[im 26/61  bone]
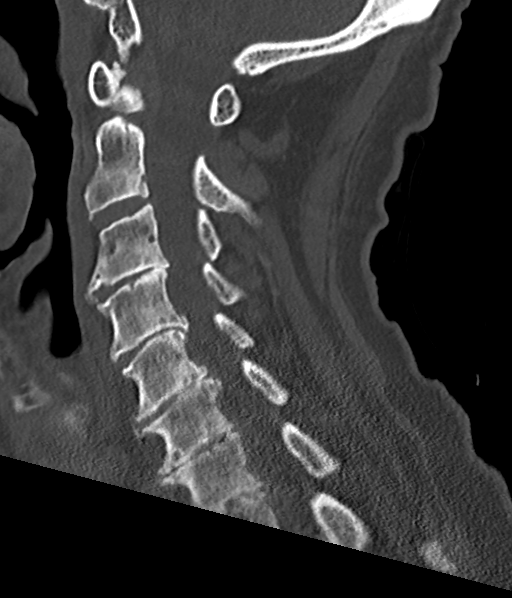
[im 31/61  soft-tissue]
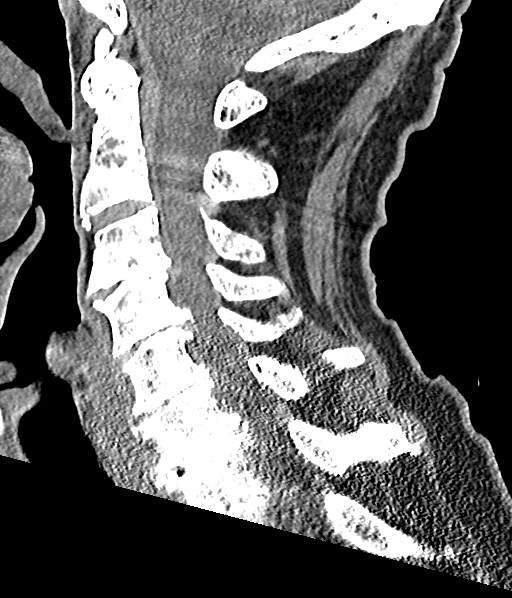
[im 31/61  bone]
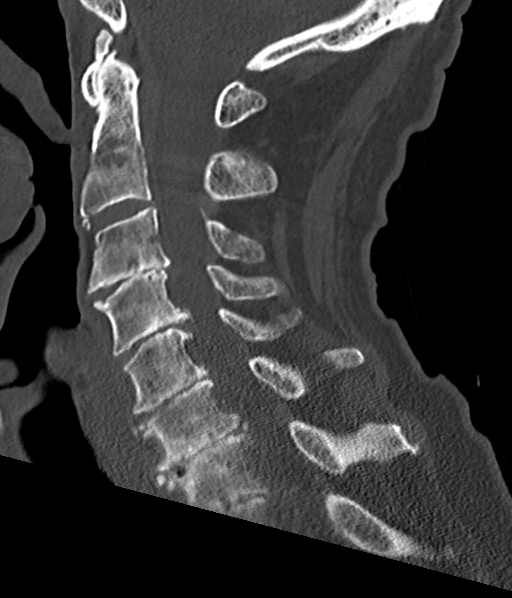
[im 36/61  bone]
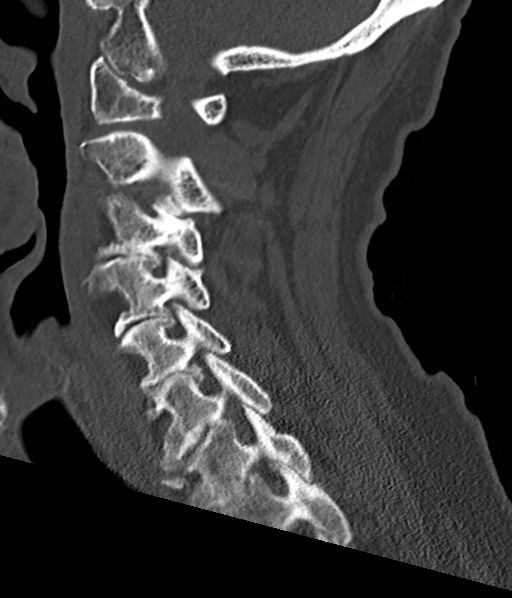
[im 41/61  bone]
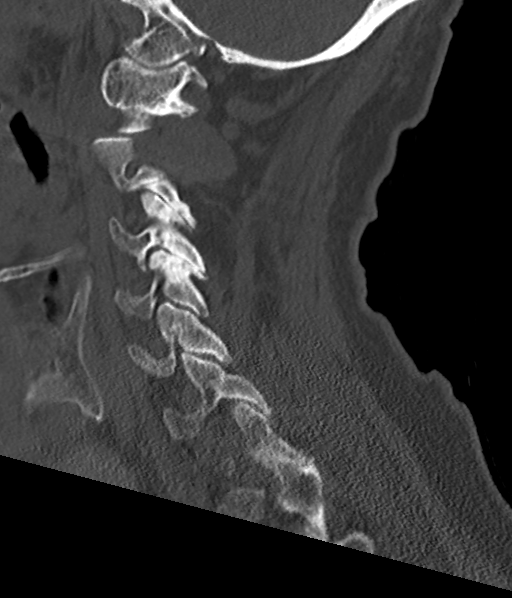

[Series 7: orthogonals · axial · 0.23mm/px · 1 of 78 slices shown]
[im 13/78  bone]
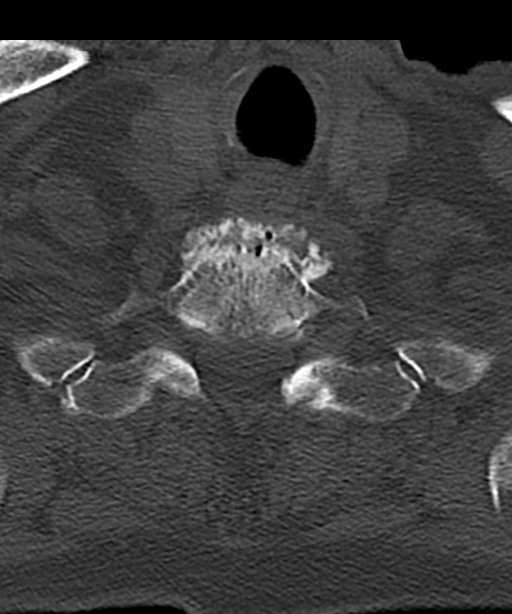

[14 of 33 positions shown; findings below may reference images not displayed]

FINDINGS: CT HEAD FINDINGS

Brain: Mild age-related atrophy and chronic microvascular ischemic
changes. There is no acute intracranial hemorrhage. No mass effect
or midline shift. No extra-axial fluid collection.

Vascular: No hyperdense vessel or unexpected calcification.

Skull: Normal. Negative for fracture or focal lesion.

Sinuses/Orbits: The visualized paranasal sinuses and the left
mastoid air cells are clear. Prior right mastectomy.

Other: None

CT CERVICAL SPINE FINDINGS

Alignment: No acute subluxation.

Skull base and vertebrae: No acute fracture.  Osteopenia.

Soft tissues and spinal canal: No prevertebral fluid or swelling. No
visible canal hematoma.

Disc levels:  Multilevel degenerative changes.

Upper chest: Not visualized.

Other: Left carotid bulb calcified plaques. Probable prior right
carotid endarterectomy.
IMPRESSION: 1. No acute intracranial pathology. Mild age-related atrophy and
chronic microvascular ischemic changes.
2. No acute/traumatic cervical spine pathology. Multilevel
degenerative changes.

## 2024-03-05 IMAGING — US US RENAL ARTERY STENOSIS
1 series · 13 of 25 positions shown · non-contrast
Comparison: None.

CLINICAL DATA: [AGE] male with a history chronic kidney
disease

EXAM:
RENAL/URINARY TRACT ULTRASOUND
RENAL DUPLEX DOPPLER ULTRASOUND

[Series 1: us renal artery stenosis · 0.23mm/px · 13 of 82 slices shown]
[im 1/82]
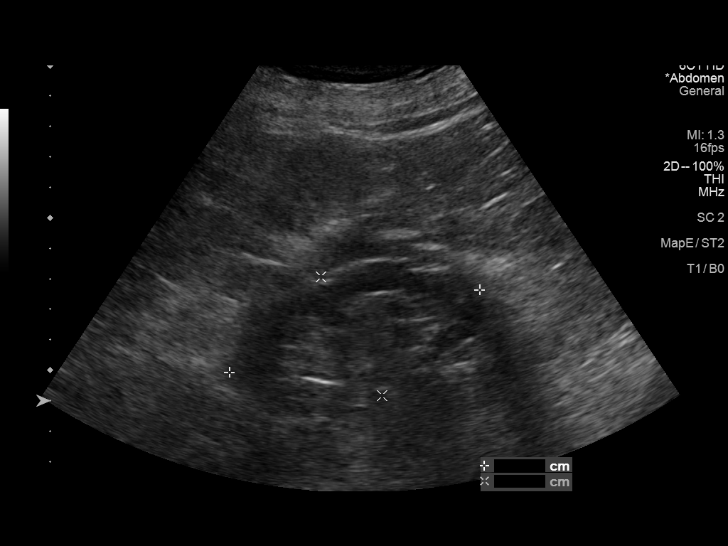
[im 7/82]
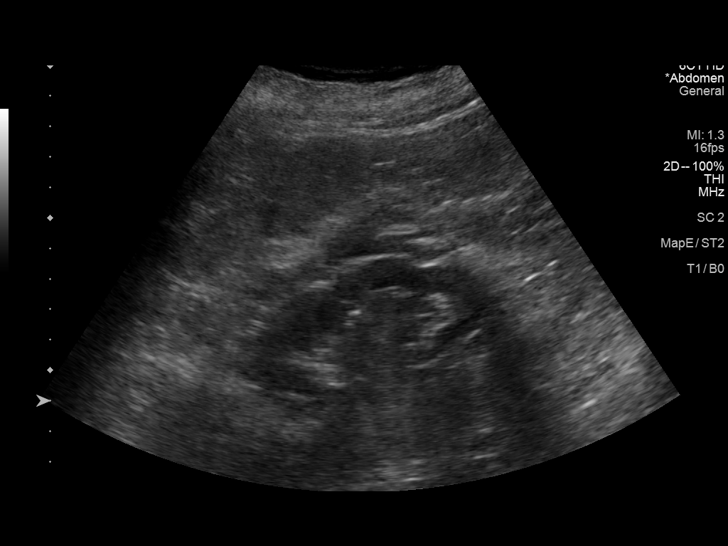
[im 14/82]
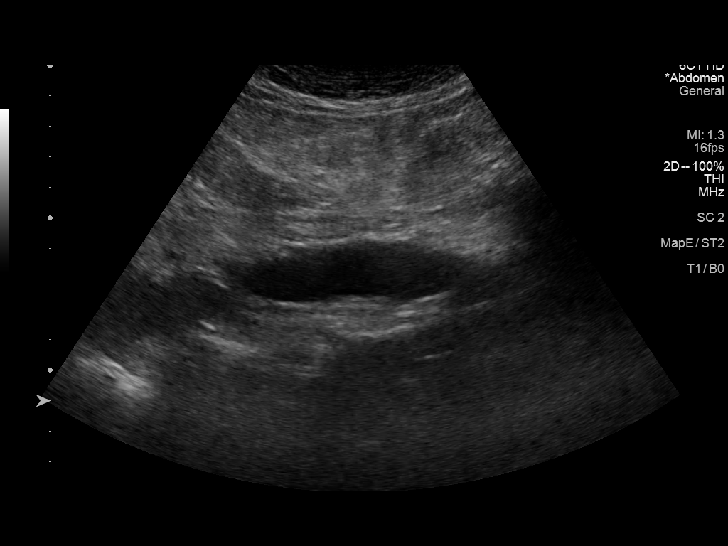
[im 21/82]
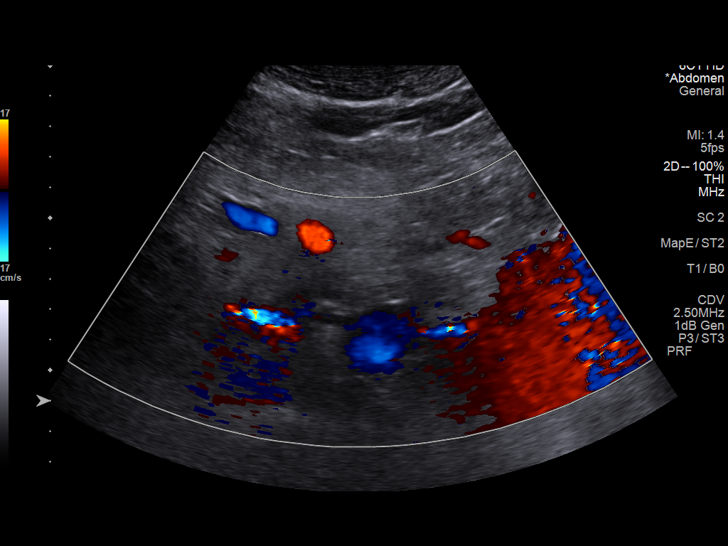
[im 28/82]
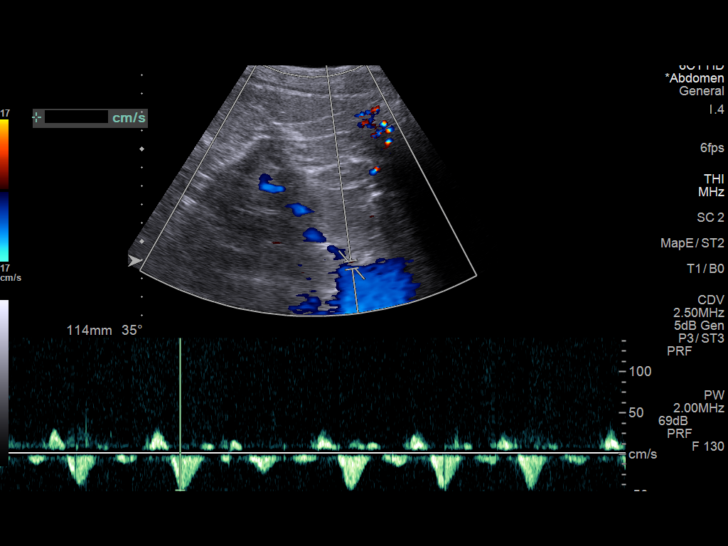
[im 34/82]
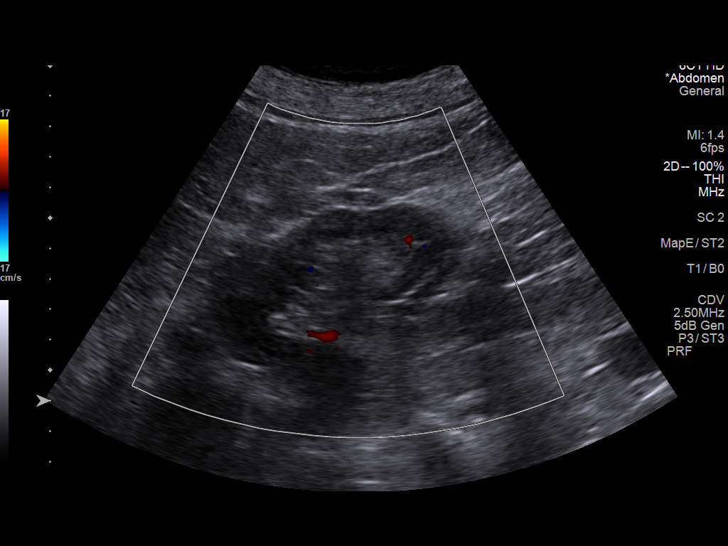
[im 41/82]
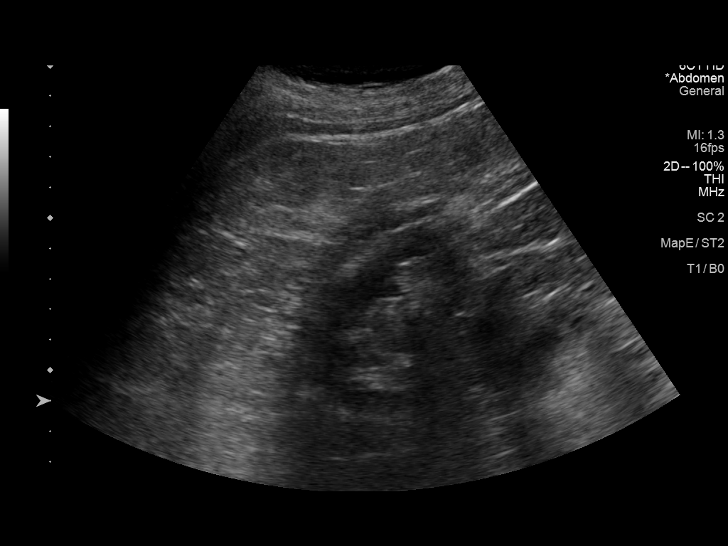
[im 48/82]
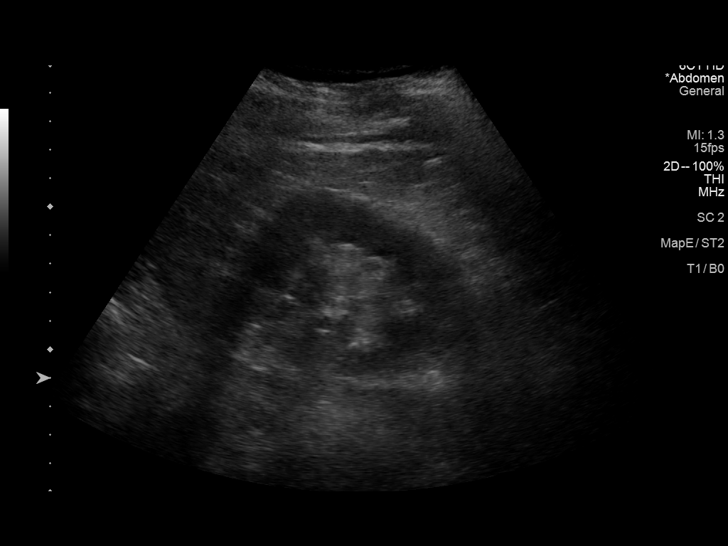
[im 55/82]
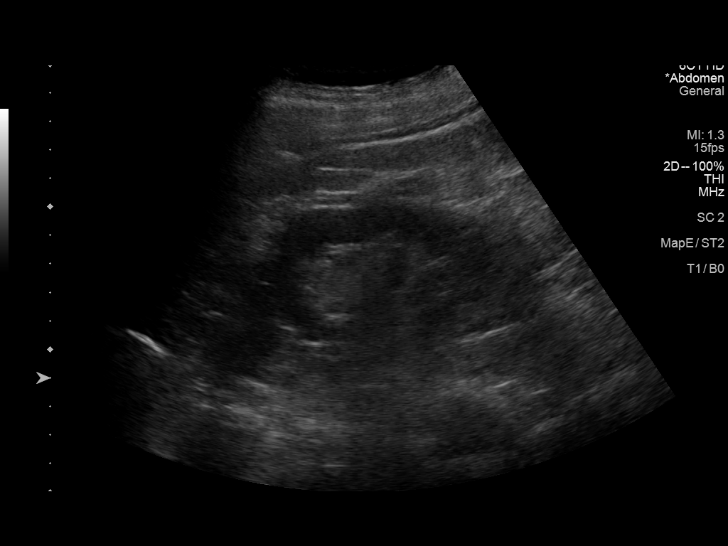
[im 61/82]
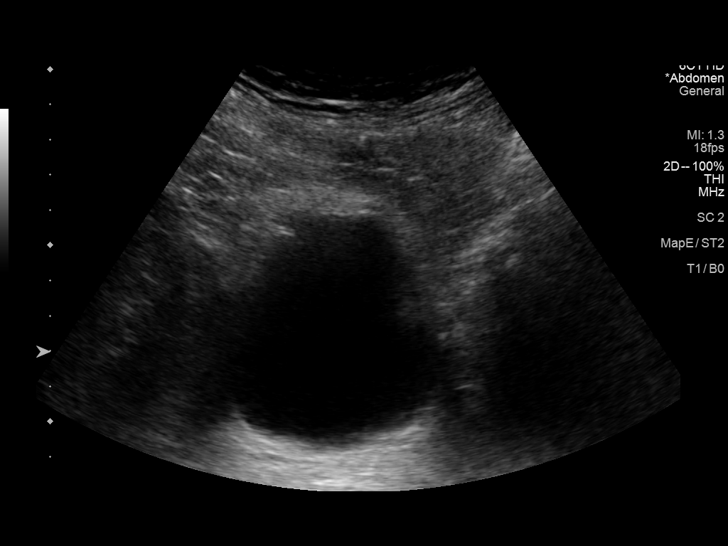
[im 68/82]
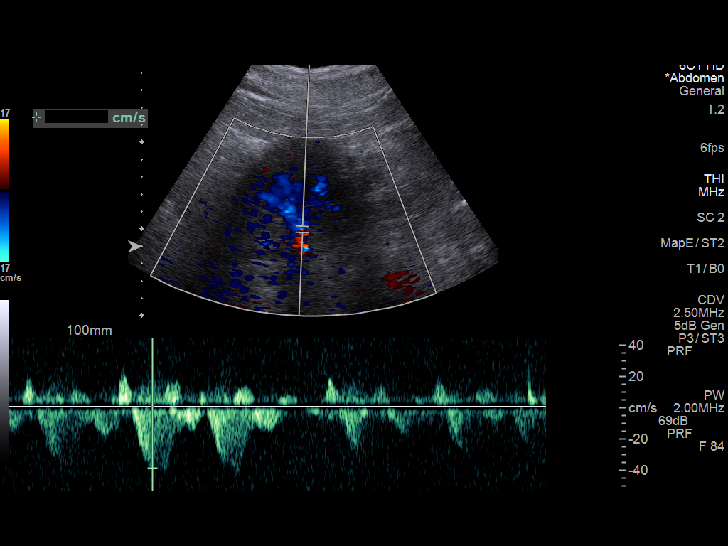
[im 75/82]
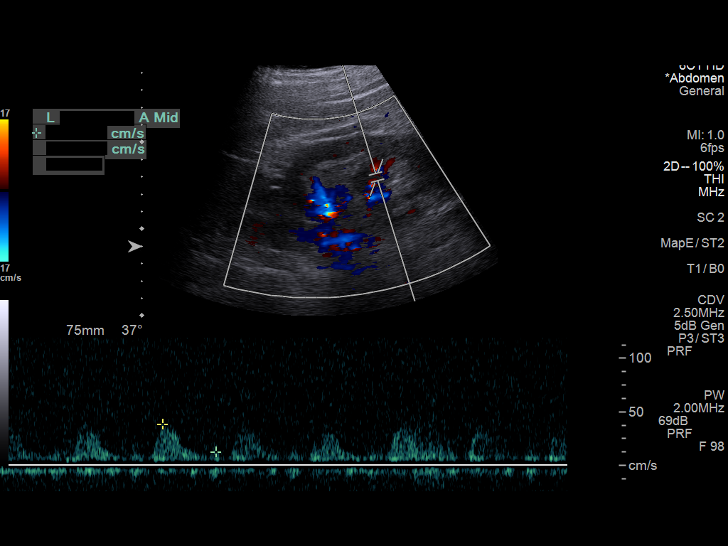
[im 82/82]
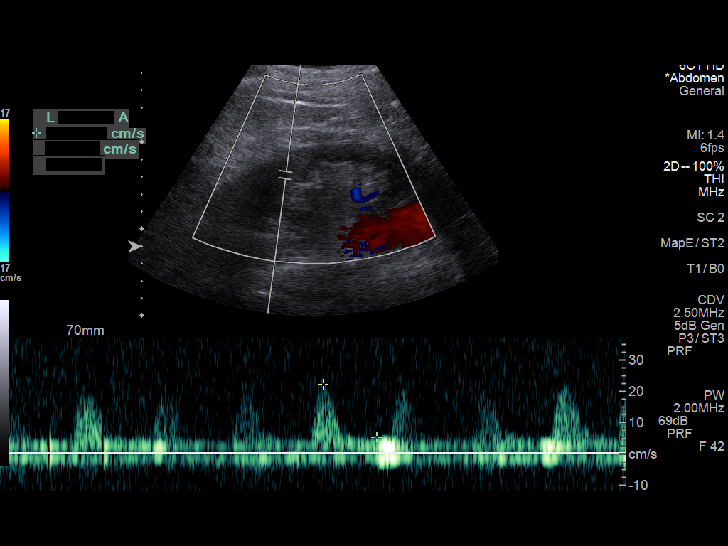

[13 of 25 positions shown; findings below may reference images not displayed]

FINDINGS: Right Kidney:

Length: 8.8 cm x 4.1 cm x 4.1 cm, 76 cc. Renal cortical thinning. No
hydronephrosis.

Left Kidney:

Length: 11.1 cm x 5.5 cm x 8.3 cm, 262 cc. Renal cortical thinning.
No hydronephrosis.

Bladder:  Unremarkable

RENAL DUPLEX ULTRASOUND

Right Renal Artery Velocities:

Origin:  75 cm/sec

Mid:  99 cm/sec

Hilum:  133 cm/sec

Interlobar:  37 cm/sec

Arcuate: Not imaged

Left Renal Artery Velocities:

Origin:  70 cm/sec

Mid:  46 cm/sec

Hilum:  55 cm/sec

Interlobar:  65 cm/sec

Arcuate:  22 cm/sec

Aortic Velocity:  150 cm/sec

Right Renal-Aortic Ratios:

Origin:

Mid:

Hilum:

Interlobar:

Arcuate: N/a

Left Renal-Aortic Ratios:

Origin:

Mid:

Hilum:

Interlobar:

Arcuate:

Aorta: 3.5 cm transverse dimension
IMPRESSION: Directed duplex of the bilateral renal arteries demonstrates no
evidence of high-grade stenosis

Abdominal aorta measures 3.5 cm. Recommend follow-up every 2 years.
This recommendation follows ACR consensus guidelines: White Paper of
the ACR Incidental Findings Committee II on Vascular Findings. [HOSPITAL] 4071; [DATE].
